# Patient Record
Sex: Female | Born: 1945 | Race: White | Hispanic: No | Smoking: Current some day smoker
Health system: Southern US, Community
[De-identification: ages and names within clinical notes are randomized; demographics above are authoritative.]

## PROBLEM LIST (undated history)

## (undated) DIAGNOSIS — N200 Calculus of kidney: Secondary | ICD-10-CM

## (undated) DIAGNOSIS — I10 Essential (primary) hypertension: Secondary | ICD-10-CM

## (undated) DIAGNOSIS — M1990 Unspecified osteoarthritis, unspecified site: Secondary | ICD-10-CM

## (undated) DIAGNOSIS — K529 Noninfective gastroenteritis and colitis, unspecified: Secondary | ICD-10-CM

## (undated) DIAGNOSIS — D649 Anemia, unspecified: Secondary | ICD-10-CM

## (undated) DIAGNOSIS — F1010 Alcohol abuse, uncomplicated: Secondary | ICD-10-CM

## (undated) HISTORY — DX: Calculus of kidney: N20.0

## (undated) HISTORY — DX: Alcohol abuse, uncomplicated: F10.10

## (undated) HISTORY — DX: Essential (primary) hypertension: I10

## (undated) HISTORY — DX: Unspecified osteoarthritis, unspecified site: M19.90

## (undated) HISTORY — PX: NO PAST SURGERIES: SHX2092

## (undated) HISTORY — DX: Noninfective gastroenteritis and colitis, unspecified: K52.9

## (undated) HISTORY — DX: Anemia, unspecified: D64.9

---

## 2011-02-25 ENCOUNTER — Emergency Department: Payer: Self-pay | Admitting: Emergency Medicine

## 2013-04-16 DIAGNOSIS — F1010 Alcohol abuse, uncomplicated: Secondary | ICD-10-CM | POA: Insufficient documentation

## 2014-02-24 DIAGNOSIS — Z720 Tobacco use: Secondary | ICD-10-CM | POA: Insufficient documentation

## 2014-03-01 LAB — HM MAMMOGRAPHY: HM MAMMO: NORMAL

## 2014-06-11 DIAGNOSIS — R0681 Apnea, not elsewhere classified: Secondary | ICD-10-CM | POA: Insufficient documentation

## 2014-06-11 DIAGNOSIS — R0609 Other forms of dyspnea: Secondary | ICD-10-CM | POA: Insufficient documentation

## 2014-06-11 DIAGNOSIS — J449 Chronic obstructive pulmonary disease, unspecified: Secondary | ICD-10-CM | POA: Insufficient documentation

## 2014-07-12 LAB — HM COLONOSCOPY: HM Colonoscopy: NEGATIVE

## 2015-08-24 ENCOUNTER — Ambulatory Visit: Payer: Self-pay | Admitting: Family Medicine

## 2015-09-19 ENCOUNTER — Encounter: Payer: Self-pay | Admitting: Nurse Practitioner

## 2015-09-19 ENCOUNTER — Ambulatory Visit (INDEPENDENT_AMBULATORY_CARE_PROVIDER_SITE_OTHER): Payer: PPO | Admitting: Nurse Practitioner

## 2015-09-19 VITALS — BP 142/88 | HR 83 | Temp 98.7°F | Resp 12 | Ht 62.25 in | Wt 143.2 lb

## 2015-09-19 DIAGNOSIS — Z720 Tobacco use: Secondary | ICD-10-CM | POA: Diagnosis not present

## 2015-09-19 DIAGNOSIS — R809 Proteinuria, unspecified: Secondary | ICD-10-CM | POA: Insufficient documentation

## 2015-09-19 DIAGNOSIS — F1010 Alcohol abuse, uncomplicated: Secondary | ICD-10-CM

## 2015-09-19 DIAGNOSIS — L409 Psoriasis, unspecified: Secondary | ICD-10-CM | POA: Insufficient documentation

## 2015-09-19 DIAGNOSIS — M1990 Unspecified osteoarthritis, unspecified site: Secondary | ICD-10-CM

## 2015-09-19 DIAGNOSIS — R42 Dizziness and giddiness: Secondary | ICD-10-CM

## 2015-09-19 DIAGNOSIS — Z7689 Persons encountering health services in other specified circumstances: Secondary | ICD-10-CM

## 2015-09-19 DIAGNOSIS — I10 Essential (primary) hypertension: Secondary | ICD-10-CM

## 2015-09-19 DIAGNOSIS — E785 Hyperlipidemia, unspecified: Secondary | ICD-10-CM | POA: Insufficient documentation

## 2015-09-19 DIAGNOSIS — Z7189 Other specified counseling: Secondary | ICD-10-CM

## 2015-09-19 MED ORDER — LOSARTAN POTASSIUM 100 MG PO TABS: 100.0000 mg | ORAL_TABLET | Freq: Every day | ORAL | Status: DC

## 2015-09-19 MED ORDER — MELOXICAM 15 MG PO TABS: 15.0000 mg | ORAL_TABLET | Freq: Every day | ORAL | Status: DC

## 2015-09-19 NOTE — Patient Instructions (Addendum)
Welcome to [facility]. We will contact you soon about your referral. Follow up in 1 month.   Nice to meet you!

## 2015-09-19 NOTE — Progress Notes (Signed)
Pre visit review using our clinic review tool, if applicable. No additional management support is needed unless otherwise documented below in the visit note. 

## 2015-09-19 NOTE — Progress Notes (Signed)
Patient ID: Heather BlanksBrenda W Winders, female    DOB: 1946-02-07  Age: 69 y.o. MRN: [id number]  CC: Establish Care   HPI Heather Rodgers presents for establishing care and CC of dizziness.   1) New pt info:  Immunizations- tdap 2015, pna 2015, wants Flu today (didn't receive due to time)   Mammogram- 2015   Pap- Unknown  Colonoscopy- 2015   Eye Exam- 08/19/15   LMP- 1989  2) Chronic Problems-  HTN- Not controlled, pt on losartan and needs refills.   Arthritis- Unsure about h/o arthritis. Need records  Alcohol abuse- 2-3 shots of liquor at night to "unwind"  Tobacco use- Willing to stop, has patches  3) Acute Problems-  Dizziness and falling down a couple of time   Balance off x 1 month  Stopped up easily on right side of face and ear  Feels like the room is spinning   Refills on losartan 100 mg and mobic and 15 mg 30 day supply requested.   History Steward DroneBrenda has a past medical history of Arthritis; Hypertension; and Kidney stones.   She has no past surgical history on file.   Her family history includes Arthritis in her father; Heart disease in her father and mother; Hypertension in her father and mother.She reports that she has been smoking Cigarettes.  She started smoking about 20 years ago. She has been smoking about 1.00 pack per day. She has never used smokeless tobacco. She reports that she drinks alcohol. She reports that she does not use illicit drugs.  No outpatient prescriptions prior to visit.   No facility-administered medications prior to visit.    ROS Review of Systems  Constitutional: Negative for fever, chills, diaphoresis and fatigue.  Respiratory: Negative for chest tightness, shortness of breath and wheezing.   Cardiovascular: Negative for chest pain, palpitations and leg swelling.  Gastrointestinal: Negative for nausea, vomiting and diarrhea.  Skin: Negative for rash.  Neurological: Positive for dizziness. Negative for weakness, numbness and headaches.   Psychiatric/Behavioral: The patient is not nervous/anxious.     Objective:  BP 142/88 mmHg  Pulse 83  Temp(Src) 98.7 F (37.1 C)  Resp 12  Ht 5' 2.25" (1.581 m)  Wt 143 lb 3.2 oz (64.955 kg)  BMI 25.99 kg/m2  SpO2 95%  Physical Exam  Constitutional: She is oriented to person, place, and time. She appears well-developed and well-nourished. No distress.  HENT:  Head: Normocephalic and atraumatic.  Right Ear: External ear normal.  Left Ear: External ear normal.  Eyes: EOM are normal. Pupils are equal, round, and reactive to light. Right eye exhibits no discharge. Left eye exhibits no discharge. No scleral icterus.  No nystagmus  Cardiovascular: Normal rate, regular rhythm and normal heart sounds.  Exam reveals no gallop and no friction rub.   No murmur heard. Pulmonary/Chest: Effort normal and breath sounds normal. No respiratory distress. She has no wheezes. She has no rales. She exhibits no tenderness.  Neurological: She is alert and oriented to person, place, and time. No cranial nerve deficit. She exhibits normal muscle tone. Coordination normal.  Unable to reproduce symptoms with position changes, normal romberg, heel/toe/sequential walking intact  Skin: Skin is warm and dry. No rash noted. She is not diaphoretic.  Psychiatric: She has a normal mood and affect. Her behavior is normal. Judgment and thought content normal.      Assessment & Plan:   Steward DroneBrenda was seen today for establish care.  Diagnoses and all orders for this visit:  Dizziness  and giddiness -     Ambulatory referral to ENT  HLD (hyperlipidemia)  Current tobacco use  AA (alcohol abuse)  Osteoarthritis, unspecified osteoarthritis type, unspecified site  Essential hypertension  Encounter to establish care  Other orders -     meloxicam (MOBIC) 15 MG tablet; Take 1 tablet (15 mg total) by mouth daily. -     losartan (COZAAR) 100 MG tablet; Take 1 tablet (100 mg total) by mouth daily.   I have  changed Ms. Delgreco's meloxicam and losartan. I am also having her maintain her aspirin EC, Cholecalciferol, and clobetasol ointment.  Meds ordered this encounter  Medications  . aspirin EC 81 MG tablet    Sig: Take 1 tablet by mouth daily.  . Cholecalciferol (D 5000) 5000 UNITS TABS    Sig: Take 1 tablet by mouth daily.  . clobetasol ointment (TEMOVATE) 0.05 %    Sig: Apply 2 application topically daily.  Marland Kitchen DISCONTD: losartan (COZAAR) 100 MG tablet    Sig: Take 100 mg by mouth daily.  Marland Kitchen DISCONTD: meloxicam (MOBIC) 15 MG tablet    Sig: Take 15 mg by mouth daily.  . meloxicam (MOBIC) 15 MG tablet    Sig: Take 1 tablet (15 mg total) by mouth daily.    Dispense:  30 tablet    Refill:  2    Order Specific Question:  Supervising Provider    Answer:  Duncan Dull L [[id number]]  . losartan (COZAAR) 100 MG tablet    Sig: Take 1 tablet (100 mg total) by mouth daily.    Dispense:  30 tablet    Refill:  2    Order Specific Question:  Supervising Provider    Answer:  Sherlene Shams [[id number]]     Follow-up: Return in about 4 weeks (around 10/17/2015) for Follow up of dizziness.

## 2015-09-22 ENCOUNTER — Ambulatory Visit: Payer: Self-pay | Admitting: Family Medicine

## 2015-09-28 ENCOUNTER — Encounter: Payer: Self-pay | Admitting: Nurse Practitioner

## 2015-09-28 DIAGNOSIS — Z124 Encounter for screening for malignant neoplasm of cervix: Secondary | ICD-10-CM | POA: Insufficient documentation

## 2015-09-28 DIAGNOSIS — R42 Dizziness and giddiness: Secondary | ICD-10-CM | POA: Insufficient documentation

## 2015-09-28 NOTE — Assessment & Plan Note (Signed)
Pt has patches and wants to quit. Follow up in 1 month.

## 2015-09-28 NOTE — Assessment & Plan Note (Signed)
Unsure of history. Will obtain records.

## 2015-09-28 NOTE — Assessment & Plan Note (Signed)
Ambulatory referral to ENT placed to check inner ear.

## 2015-09-28 NOTE — Assessment & Plan Note (Signed)
BP Readings from Last 3 Encounters:  09/19/15 142/88   Uncontrolled. Losartan refilled. Pt has been out of medication for unknown period of time she reports (probably days approx). Will follow up in 4 weeks.

## 2015-09-28 NOTE — Assessment & Plan Note (Signed)
Pulled over note from [facility]. Pt stated 2-3 shots at night, last records reports 6-7 shots of vodka in 2014. Pt did not seem intoxicated at visit today.

## 2015-09-28 NOTE — Assessment & Plan Note (Signed)
Will obtain records.   No results found for: CHOL, HDL, LDLCALC, LDLDIRECT, TRIG, CHOLHDL

## 2015-09-28 NOTE — Assessment & Plan Note (Signed)
Discussed acute and chronic issues. Reviewed health maintenance measures, PFSHx, and immunizations. Obtain records from previous facility.   

## 2015-09-30 ENCOUNTER — Other Ambulatory Visit: Payer: Self-pay | Admitting: Otolaryngology

## 2015-09-30 DIAGNOSIS — R42 Dizziness and giddiness: Secondary | ICD-10-CM

## 2015-10-13 ENCOUNTER — Ambulatory Visit
Admission: RE | Admit: 2015-10-13 | Discharge: 2015-10-13 | Disposition: A | Discharge status: Home / Self Care | Payer: PPO | Source: Ambulatory Visit | Attending: Otolaryngology | Admitting: Otolaryngology

## 2015-10-13 DIAGNOSIS — I6523 Occlusion and stenosis of bilateral carotid arteries: Secondary | ICD-10-CM | POA: Insufficient documentation

## 2015-10-13 DIAGNOSIS — R42 Dizziness and giddiness: Secondary | ICD-10-CM | POA: Insufficient documentation

## 2015-10-13 MED ORDER — GADOBENATE DIMEGLUMINE 529 MG/ML IV SOLN
15.0000 mL | Freq: Once | INTRAVENOUS | Status: AC | PRN
  Administered 2015-10-13: 13 mL via INTRAVENOUS

## 2015-10-17 ENCOUNTER — Ambulatory Visit: Payer: PPO | Admitting: Nurse Practitioner

## 2015-10-18 ENCOUNTER — Ambulatory Visit: Payer: PPO | Admitting: Nurse Practitioner

## 2015-10-25 ENCOUNTER — Other Ambulatory Visit: Payer: Self-pay | Admitting: Vascular Surgery

## 2015-10-25 DIAGNOSIS — I6523 Occlusion and stenosis of bilateral carotid arteries: Secondary | ICD-10-CM

## 2015-10-27 ENCOUNTER — Ambulatory Visit
Admission: RE | Admit: 2015-10-27 | Discharge: 2015-10-27 | Disposition: A | Discharge status: Home / Self Care | Payer: PPO | Source: Ambulatory Visit | Attending: Vascular Surgery | Admitting: Vascular Surgery

## 2015-10-27 DIAGNOSIS — I6523 Occlusion and stenosis of bilateral carotid arteries: Secondary | ICD-10-CM | POA: Insufficient documentation

## 2015-10-27 MED ORDER — IOHEXOL 350 MG/ML SOLN
100.0000 mL | Freq: Once | INTRAVENOUS | Status: AC | PRN
  Administered 2015-10-27: 80 mL via INTRAVENOUS

## 2015-11-02 ENCOUNTER — Telehealth: Payer: Self-pay | Admitting: *Deleted

## 2015-11-02 NOTE — Telephone Encounter (Signed)
Patient requested a referral to see the Neurologist, patient stated that she has been for a MRI,Ultrasound and to the ear specialist. Her Ultrasound viewed that her arteries were not causing her dizziness. Please Advised

## 2015-11-04 ENCOUNTER — Other Ambulatory Visit: Payer: Self-pay | Admitting: Nurse Practitioner

## 2015-11-04 DIAGNOSIS — R42 Dizziness and giddiness: Secondary | ICD-10-CM

## 2015-11-04 NOTE — Telephone Encounter (Signed)
Please advise for referral, thanks 

## 2015-11-04 NOTE — Telephone Encounter (Signed)
Referral placed  Thanks!

## 2015-11-04 NOTE — Telephone Encounter (Signed)
Patient requested a call, please advise-thanks

## 2015-11-09 ENCOUNTER — Ambulatory Visit (INDEPENDENT_AMBULATORY_CARE_PROVIDER_SITE_OTHER): Payer: PPO | Admitting: Nurse Practitioner

## 2015-11-09 VITALS — BP 142/90 | HR 68 | Temp 97.5°F | Ht 62.25 in | Wt 142.0 lb

## 2015-11-09 DIAGNOSIS — R208 Other disturbances of skin sensation: Secondary | ICD-10-CM | POA: Diagnosis not present

## 2015-11-09 DIAGNOSIS — R42 Dizziness and giddiness: Secondary | ICD-10-CM | POA: Diagnosis not present

## 2015-11-09 DIAGNOSIS — R200 Anesthesia of skin: Secondary | ICD-10-CM

## 2015-11-09 LAB — CBC WITH DIFFERENTIAL/PLATELET
BASOS PCT: 0.4 % (ref 0.0–3.0)
Basophils Absolute: 0 10*3/uL (ref 0.0–0.1)
EOS ABS: 0 10*3/uL (ref 0.0–0.7)
Eosinophils Relative: 1 % (ref 0.0–5.0)
HEMATOCRIT: 29.1 % — AB (ref 36.0–46.0)
Hemoglobin: 9.9 g/dL — ABNORMAL LOW (ref 12.0–15.0)
LYMPHS ABS: 0.6 10*3/uL — AB (ref 0.7–4.0)
Lymphocytes Relative: 18.1 % (ref 12.0–46.0)
MCHC: 34 g/dL (ref 30.0–36.0)
MCV: 108.9 fl — ABNORMAL HIGH (ref 78.0–100.0)
MONO ABS: 0.3 10*3/uL (ref 0.1–1.0)
Monocytes Relative: 10.5 % (ref 3.0–12.0)
NEUTROS ABS: 2.2 10*3/uL (ref 1.4–7.7)
NEUTROS PCT: 70 % (ref 43.0–77.0)
PLATELETS: 87 10*3/uL — AB (ref 150.0–400.0)
RBC: 2.67 Mil/uL — ABNORMAL LOW (ref 3.87–5.11)
RDW: 14.9 % (ref 11.5–15.5)
WBC: 3.1 10*3/uL — ABNORMAL LOW (ref 4.0–10.5)

## 2015-11-09 LAB — COMPREHENSIVE METABOLIC PANEL
ALK PHOS: 101 U/L (ref 39–117)
ALT: 11 U/L (ref 0–35)
AST: 48 U/L — AB (ref 0–37)
Albumin: 3.7 g/dL (ref 3.5–5.2)
BILIRUBIN TOTAL: 1.1 mg/dL (ref 0.2–1.2)
BUN: 14 mg/dL (ref 6–23)
CO2: 21 meq/L (ref 19–32)
CREATININE: 0.9 mg/dL (ref 0.40–1.20)
Calcium: 8.7 mg/dL (ref 8.4–10.5)
Chloride: 105 mEq/L (ref 96–112)
GFR: 66.24 mL/min (ref >60.00)
GLUCOSE: 79 mg/dL (ref 70–99)
Potassium: 3.7 mEq/L (ref 3.5–5.1)
Sodium: 142 mEq/L (ref 135–145)
TOTAL PROTEIN: 8.5 g/dL — AB (ref 6.0–8.3)

## 2015-11-09 LAB — FOLATE: Folate: 4.3 ng/mL — ABNORMAL LOW (ref >5.9)

## 2015-11-09 LAB — VITAMIN B12: VITAMIN B 12: 214 pg/mL (ref 211–911)

## 2015-11-09 MED ORDER — GABAPENTIN 300 MG PO CAPS: 300.0000 mg | ORAL_CAPSULE | Freq: Every day | ORAL | Status: DC

## 2015-11-09 NOTE — Progress Notes (Signed)
Patient ID: Heather Rodgers, female    DOB: 31-Oct-1946  Age: 69 y.o. MRN: [id number]  CC: Acute Visit   HPI ERNESTENE COOVER presents for CC of left arm/hand numbness.   1) Left elbow to hand numbness x 1 month.   Onset- 1 month  Location- left arm radial side from elbow to thumb and into each finger she reports; denies palm or dorsal surface numbness.  Duration - Intermittent, but daily  Characteristics- numbness Aggravating factors- falling, use of left arm Relieving factors- rest and mobic Severity- moderate  History Gigi has a past medical history of Arthritis; Hypertension; and Kidney stones.   She has no past surgical history on file.   Her family history includes Arthritis in her father; Heart disease in her father and mother; Hypertension in her father and mother.She reports that she has been smoking Cigarettes.  She started smoking about 20 years ago. She has been smoking about 1.00 pack per day. She has never used smokeless tobacco. She reports that she drinks alcohol. She reports that she does not use illicit drugs.  Outpatient Prescriptions Prior to Visit  Medication Sig Dispense Refill  . aspirin EC 81 MG tablet Take 1 tablet by mouth daily.    . Cholecalciferol (D 5000) 5000 UNITS TABS Take 1 tablet by mouth daily.    . clobetasol ointment (TEMOVATE) 9.20 % Apply 2 application topically daily.    Marland Kitchen losartan (COZAAR) 100 MG tablet Take 1 tablet (100 mg total) by mouth daily. 30 tablet 2  . meloxicam (MOBIC) 15 MG tablet Take 1 tablet (15 mg total) by mouth daily. 30 tablet 2   No facility-administered medications prior to visit.    ROS Review of Systems  Constitutional: Negative for fever, chills, diaphoresis and fatigue.  Respiratory: Negative for chest tightness, shortness of breath and wheezing.   Cardiovascular: Negative for chest pain, palpitations and leg swelling.  Gastrointestinal: Negative for nausea, vomiting and diarrhea.  Skin: Negative for rash.   Neurological: Positive for numbness. Negative for dizziness, weakness and headaches.   Objective:  BP 142/90 mmHg  Pulse 68  Temp(Src) 97.5 F (36.4 C) (Oral)  Ht 5' 2.25" (1.581 m)  Wt 142 lb (64.411 kg)  BMI 25.77 kg/m2  Physical Exam  Constitutional: She is oriented to person, place, and time. She appears well-developed and well-nourished. No distress.  HENT:  Head: Normocephalic and atraumatic.  Right Ear: External ear normal.  Left Ear: External ear normal.  Cardiovascular: Normal rate, regular rhythm and normal heart sounds.  Exam reveals no gallop and no friction rub.   No murmur heard. Pulmonary/Chest: Effort normal and breath sounds normal. No respiratory distress. She has no wheezes. She has no rales. She exhibits no tenderness.  Neurological: She is alert and oriented to person, place, and time. No cranial nerve deficit. She exhibits normal muscle tone. Coordination normal.  Deltoid 5/5 Bilateral, Biceps 5/5 bilateral, Wrist extensors 5/5 bilateral, Triceps 5/5 bilateral, finger flexors and abductors 5/5 bilateral, grip 5/5 bilateraly no Hoffman's, intact heel/toe/sequential walking, sensation intact upper and lower extremities. Straight leg raise negative bilaterally. DTR's upper and lower 2+   Skin: Skin is warm and dry. No rash noted. She is not diaphoretic.  Psychiatric: She has a normal mood and affect. Her behavior is normal. Judgment and thought content normal.      Assessment & Plan:   Rynn was seen today for acute visit.  Diagnoses and all orders for this visit:  Dizziness and giddiness -  B12 -     Comp Met (CMET) -     Folate -     CBC w/Diff  Left arm numbness  Dizziness  Other orders -     gabapentin (NEURONTIN) 300 MG capsule; Take 1 capsule (300 mg total) by mouth at bedtime.   I have discontinued Ms. Worst's meloxicam. I am also having her start on gabapentin. Additionally, I am having her maintain her aspirin EC, Cholecalciferol,  clobetasol ointment, losartan, and meclizine.  Meds ordered this encounter  Medications  . meclizine (ANTIVERT) 25 MG tablet    Sig: Take 25 mg by mouth 3 (three) times daily as needed for dizziness.  . gabapentin (NEURONTIN) 300 MG capsule    Sig: Take 1 capsule (300 mg total) by mouth at bedtime.    Dispense:  30 capsule    Refill:  0    Order Specific Question:  Supervising Provider    Answer:  Crecencio Mc [[id number]]     Follow-up: Return in about 2 weeks (around 11/23/2015) for Numbness left hand.

## 2015-11-09 NOTE — Patient Instructions (Addendum)
Follow up in 2 weeks.   Gabapentin 1 capsule at night time.   Neurology appointment in Jan. With Dr. Everlena CooperJaffe.

## 2015-11-10 ENCOUNTER — Ambulatory Visit: Payer: PPO | Admitting: Nurse Practitioner

## 2015-11-16 ENCOUNTER — Encounter: Payer: Self-pay | Admitting: Nurse Practitioner

## 2015-11-16 ENCOUNTER — Ambulatory Visit (INDEPENDENT_AMBULATORY_CARE_PROVIDER_SITE_OTHER): Payer: PPO | Admitting: Nurse Practitioner

## 2015-11-16 VITALS — BP 152/88 | HR 88 | Temp 97.5°F | Ht 65.0 in | Wt 146.0 lb

## 2015-11-16 DIAGNOSIS — F1010 Alcohol abuse, uncomplicated: Secondary | ICD-10-CM

## 2015-11-16 DIAGNOSIS — D500 Iron deficiency anemia secondary to blood loss (chronic): Secondary | ICD-10-CM

## 2015-11-16 DIAGNOSIS — R42 Dizziness and giddiness: Secondary | ICD-10-CM

## 2015-11-16 DIAGNOSIS — R05 Cough: Secondary | ICD-10-CM | POA: Diagnosis not present

## 2015-11-16 DIAGNOSIS — D519 Vitamin B12 deficiency anemia, unspecified: Secondary | ICD-10-CM

## 2015-11-16 DIAGNOSIS — I10 Essential (primary) hypertension: Secondary | ICD-10-CM

## 2015-11-16 DIAGNOSIS — Z720 Tobacco use: Secondary | ICD-10-CM | POA: Diagnosis not present

## 2015-11-16 DIAGNOSIS — R059 Cough, unspecified: Secondary | ICD-10-CM | POA: Insufficient documentation

## 2015-11-16 DIAGNOSIS — R200 Anesthesia of skin: Secondary | ICD-10-CM | POA: Insufficient documentation

## 2015-11-16 LAB — CBC WITH DIFFERENTIAL/PLATELET
Basophils Absolute: 0 10*3/uL (ref 0.0–0.1)
Basophils Relative: 0.5 % (ref 0.0–3.0)
EOS PCT: 1 % (ref 0.0–5.0)
Eosinophils Absolute: 0 10*3/uL (ref 0.0–0.7)
HCT: 28.3 % — ABNORMAL LOW (ref 36.0–46.0)
Hemoglobin: 9.7 g/dL — ABNORMAL LOW (ref 12.0–15.0)
LYMPHS ABS: 0.6 10*3/uL — AB (ref 0.7–4.0)
Lymphocytes Relative: 13.9 % (ref 12.0–46.0)
MCHC: 34.4 g/dL (ref 30.0–36.0)
MONOS PCT: 8.2 % (ref 3.0–12.0)
Monocytes Absolute: 0.3 10*3/uL (ref 0.1–1.0)
NEUTROS ABS: 3.2 10*3/uL (ref 1.4–7.7)
NEUTROS PCT: 76.4 % (ref 43.0–77.0)
Platelets: 87 10*3/uL — ABNORMAL LOW (ref 150.0–400.0)
RDW: 15.1 % (ref 11.5–15.5)
WBC: 4.2 10*3/uL (ref 4.0–10.5)

## 2015-11-16 MED ORDER — CYANOCOBALAMIN 1000 MCG/ML IJ SOLN
1000.0000 ug | Freq: Once | INTRAMUSCULAR | Status: AC
  Administered 2015-11-16: 1000 ug via INTRAMUSCULAR

## 2015-11-16 NOTE — Assessment & Plan Note (Signed)
Patient was given meclizine at 11/09/2015 appointment

## 2015-11-16 NOTE — Patient Instructions (Addendum)
Thank you for getting a B-12 injection today.   Repeat for 4 weeks total (3 more).   [facility] [address] [place], [place] [address] Hours of Operation Monday - Friday, 8 a.m. - 5 p.m. Main: [phone]   Walk in at your convenience for Chest x-ray.   We will call with next instructions.

## 2015-11-16 NOTE — Assessment & Plan Note (Signed)
Recent cough with small amount of blood in sputum. No chest x-ray on history will obtain this. Patient given instructions on handout and verbally.

## 2015-11-16 NOTE — Assessment & Plan Note (Signed)
Numbness from elbow to hand. Will try patient on gabapentin since neuro exam is essentially normal. Instructions given verbally and on AVS. Patient questions were answered to satisfaction. Discussed taking at nighttime to avoid drowsy effects during the day. Follow-up in 2 weeks

## 2015-11-16 NOTE — Assessment & Plan Note (Signed)
Patient is still had dizziness and has been checked out by ENT, vascular, and has an appointment upcoming with neuro. This could be due to anemia as well. Patient still having arm numbness that has not changed with Neurontin. Will follow

## 2015-11-16 NOTE — Assessment & Plan Note (Signed)
Patient is still drinking approximately 4 glasses of mixed drink with Dr. and Gatorade daily. She was advised to cut down slowly on alcohol intake her AST was elevated at last visit. She says she is not interested in quitting drinking or smoking.

## 2015-11-16 NOTE — Progress Notes (Signed)
Patient ID: Heather Rodgers, female    DOB: 1946-02-11  Age: 69 y.o. MRN: [id number]  CC: Follow-up   HPI Heather Rodgers presents for follow-up of anemia. Patient is accompanied by her daughter today.  1) No Change of left-sided numbness from elbow to hand.  2) Coughed up a little bit of blood yesterday "streak" Patient reports that she does have bleeding with brushing her teeth daily.  Pt reports bleeding from vaginal/urethral area 3 months ago after arthritis medication   Patient denies ever having been told she has low platelets in past.  3) Gatorade and vodka 4 mixed drinks daily  Still smoking she does report she wants to continue to smoke and understands the health risks that are associated with this. She does not have any recent chest x-rays on file and does not remember having one recently. Will obtain today  A she reports she would like to have her daughter signed a form to get health information since she is the one helping her out with her appointments and such.  History Heather Rodgers has a past medical history of Arthritis; Hypertension; and Kidney stones.   She has no past surgical history on file.   Her family history includes Arthritis in her father; Heart disease in her father and mother; Hypertension in her father and mother.She reports that she has been smoking Cigarettes.  She started smoking about 20 years ago. She has been smoking about 1.00 pack per day. She has never used smokeless tobacco. She reports that she drinks alcohol. She reports that she does not use illicit drugs.  Outpatient Prescriptions Prior to Visit  Medication Sig Dispense Refill  . aspirin EC 81 MG tablet Take 1 tablet by mouth daily.    . clobetasol ointment (TEMOVATE) 0.05 % Apply 2 application topically daily.    Marland Kitchen gabapentin (NEURONTIN) 300 MG capsule Take 1 capsule (300 mg total) by mouth at bedtime. 30 capsule 0  . losartan (COZAAR) 100 MG tablet Take 1 tablet (100 mg total) by mouth daily. 30  tablet 2  . meclizine (ANTIVERT) 25 MG tablet Take 25 mg by mouth 3 (three) times daily as needed for dizziness.    . meloxicam (MOBIC) 15 MG tablet Take 1 tablet (15 mg total) by mouth daily. 30 tablet 2  . Cholecalciferol (D 5000) 5000 UNITS TABS Take 1 tablet by mouth daily.     No facility-administered medications prior to visit.    ROS Review of Systems  Constitutional: Negative for fever, chills, diaphoresis and fatigue.  HENT: Negative for mouth sores and nosebleeds.   Respiratory: Positive for cough. Negative for chest tightness, shortness of breath and wheezing.   Cardiovascular: Negative for chest pain, palpitations and leg swelling.  Gastrointestinal: Negative for nausea, vomiting, diarrhea, blood in stool and anal bleeding.  Genitourinary: Negative for hematuria.  Skin: Negative for rash.  Neurological: Positive for numbness. Negative for dizziness, weakness and headaches.       Left arm from elbow to hand  Hematological: Bruises/bleeds easily.  Psychiatric/Behavioral: The patient is not nervous/anxious.     Objective:  BP 152/88 mmHg  Pulse 88  Temp(Src) 97.5 F (36.4 C)  Ht  (1.651 m)  Wt 146 lb (66.225 kg)  BMI 24.30 kg/m2  SpO2 97%  Physical Exam  Constitutional: She is oriented to person, place, and time. She appears well-developed and well-nourished. No distress.  HENT:  Head: Normocephalic and atraumatic.  Right Ear: External ear normal.  Left Ear: External ear  normal.  Cardiovascular: Normal rate and regular rhythm.   Pulmonary/Chest: Effort normal and breath sounds normal. No respiratory distress. She has no wheezes. She has no rales. She exhibits no tenderness.  Neurological: She is alert and oriented to person, place, and time. No cranial nerve deficit. She exhibits normal muscle tone. Coordination normal.  Skin: Skin is warm and dry. No rash noted. She is not diaphoretic.  Psychiatric:  Patient has flat affect but was tearful today    Assessment & Plan:   Heather Rodgers was seen today for follow-up.  Diagnoses and all orders for this visit:  Dizziness and giddiness  Anemia due to chronic blood loss -     CBC with Differential/Platelet  Cough -     DG Chest 2 View; Future  Current tobacco use -     DG Chest 2 View; Future  B12 deficiency anemia -     cyanocobalamin ((VITAMIN B-12)) injection 1,000 mcg; Inject 1 mL (1,000 mcg total) into the muscle once.  AA (alcohol abuse)  Essential hypertension   I am having Ms. Bais maintain her aspirin EC, Cholecalciferol, clobetasol ointment, meloxicam, losartan, meclizine, and gabapentin. We administered cyanocobalamin.  Meds ordered this encounter  Medications  . cyanocobalamin ((VITAMIN B-12)) injection 1,000 mcg    Sig:      Follow-up: Return in about 1 week (around 11/23/2015) for B12 injection .

## 2015-11-16 NOTE — Assessment & Plan Note (Addendum)
Patient is taking losartan daily. Her blood pressure still uncontrolled at this time. We'll do follow-up visit after we figure out where bleeding is coming from. Recent CTA shows no bleeding into the brain.

## 2015-11-16 NOTE — Assessment & Plan Note (Signed)
Patient does have very low hemoglobin and hematocrit and platelets. Her daughter is with her and gives a history of recent blood loss either vaginally or in urine. This resolved about one month ago. We'll recheck today and if trending down will send her to hematology or ER for replacement.

## 2015-11-16 NOTE — Assessment & Plan Note (Signed)
Patient reports she has patches at home and has not used them. Daughter reports she has had these for quite some time. Patient understands risks associated with continuous smoking. Will obtain chest x-ray today

## 2015-11-16 NOTE — Assessment & Plan Note (Signed)
Patient's recent B12 was at the very low end of normal. Will start replacement therapy with weekly B12 injections 4 weeks and repeat a level. Patient's folate level was also low advised multivitamin with folate in it. Patient is a known alcoholic and does not want to cut down on drinking.

## 2015-11-17 ENCOUNTER — Other Ambulatory Visit: Payer: Self-pay | Admitting: Nurse Practitioner

## 2015-11-17 DIAGNOSIS — D500 Iron deficiency anemia secondary to blood loss (chronic): Secondary | ICD-10-CM

## 2015-11-23 ENCOUNTER — Ambulatory Visit (INDEPENDENT_AMBULATORY_CARE_PROVIDER_SITE_OTHER): Payer: PPO

## 2015-11-23 ENCOUNTER — Ambulatory Visit
Admission: RE | Admit: 2015-11-23 | Discharge: 2015-11-23 | Disposition: A | Discharge status: Home / Self Care | Payer: PPO | Source: Ambulatory Visit | Attending: Nurse Practitioner | Admitting: Nurse Practitioner

## 2015-11-23 ENCOUNTER — Ambulatory Visit: Payer: PPO | Admitting: Nurse Practitioner

## 2015-11-23 DIAGNOSIS — R05 Cough: Secondary | ICD-10-CM | POA: Insufficient documentation

## 2015-11-23 DIAGNOSIS — E538 Deficiency of other specified B group vitamins: Secondary | ICD-10-CM

## 2015-11-23 DIAGNOSIS — F17200 Nicotine dependence, unspecified, uncomplicated: Secondary | ICD-10-CM | POA: Diagnosis present

## 2015-11-23 DIAGNOSIS — R918 Other nonspecific abnormal finding of lung field: Secondary | ICD-10-CM | POA: Insufficient documentation

## 2015-11-23 DIAGNOSIS — R059 Cough, unspecified: Secondary | ICD-10-CM

## 2015-11-23 DIAGNOSIS — Z720 Tobacco use: Secondary | ICD-10-CM

## 2015-11-23 MED ORDER — CYANOCOBALAMIN 1000 MCG/ML IJ SOLN
1000.0000 ug | Freq: Once | INTRAMUSCULAR | Status: AC
  Administered 2015-11-23: 1000 ug via INTRAMUSCULAR

## 2015-11-23 NOTE — Progress Notes (Signed)
Patient came in for B12 injection.  Received in Left deltoid.  Patient tolerated well.  

## 2015-11-27 DIAGNOSIS — K529 Noninfective gastroenteritis and colitis, unspecified: Secondary | ICD-10-CM

## 2015-11-27 DIAGNOSIS — F1010 Alcohol abuse, uncomplicated: Secondary | ICD-10-CM

## 2015-11-27 HISTORY — DX: Alcohol abuse, uncomplicated: F10.10

## 2015-11-27 HISTORY — DX: Noninfective gastroenteritis and colitis, unspecified: K52.9

## 2015-11-29 ENCOUNTER — Inpatient Hospital Stay: Payer: PPO | Attending: Hematology and Oncology | Admitting: Internal Medicine

## 2015-11-29 ENCOUNTER — Other Ambulatory Visit: Payer: Self-pay | Admitting: Nurse Practitioner

## 2015-11-29 ENCOUNTER — Encounter: Payer: Self-pay | Admitting: Internal Medicine

## 2015-11-29 ENCOUNTER — Inpatient Hospital Stay: Payer: PPO

## 2015-11-29 VITALS — BP 167/110 | HR 80 | Temp 97.0°F | Resp 18 | Ht 65.0 in | Wt 146.2 lb

## 2015-11-29 DIAGNOSIS — M129 Arthropathy, unspecified: Secondary | ICD-10-CM | POA: Diagnosis not present

## 2015-11-29 DIAGNOSIS — J42 Unspecified chronic bronchitis: Secondary | ICD-10-CM

## 2015-11-29 DIAGNOSIS — F1010 Alcohol abuse, uncomplicated: Secondary | ICD-10-CM | POA: Diagnosis not present

## 2015-11-29 DIAGNOSIS — Z87442 Personal history of urinary calculi: Secondary | ICD-10-CM | POA: Insufficient documentation

## 2015-11-29 DIAGNOSIS — D696 Thrombocytopenia, unspecified: Secondary | ICD-10-CM | POA: Insufficient documentation

## 2015-11-29 DIAGNOSIS — D649 Anemia, unspecified: Secondary | ICD-10-CM

## 2015-11-29 DIAGNOSIS — F17210 Nicotine dependence, cigarettes, uncomplicated: Secondary | ICD-10-CM | POA: Diagnosis not present

## 2015-11-29 DIAGNOSIS — Z79899 Other long term (current) drug therapy: Secondary | ICD-10-CM | POA: Insufficient documentation

## 2015-11-29 DIAGNOSIS — J449 Chronic obstructive pulmonary disease, unspecified: Secondary | ICD-10-CM | POA: Insufficient documentation

## 2015-11-29 DIAGNOSIS — Z7982 Long term (current) use of aspirin: Secondary | ICD-10-CM | POA: Diagnosis not present

## 2015-11-29 DIAGNOSIS — R42 Dizziness and giddiness: Secondary | ICD-10-CM | POA: Diagnosis not present

## 2015-11-29 DIAGNOSIS — E538 Deficiency of other specified B group vitamins: Secondary | ICD-10-CM | POA: Diagnosis not present

## 2015-11-29 DIAGNOSIS — I10 Essential (primary) hypertension: Secondary | ICD-10-CM | POA: Diagnosis not present

## 2015-11-29 DIAGNOSIS — R05 Cough: Secondary | ICD-10-CM | POA: Insufficient documentation

## 2015-11-29 DIAGNOSIS — R918 Other nonspecific abnormal finding of lung field: Secondary | ICD-10-CM | POA: Insufficient documentation

## 2015-11-29 LAB — CBC WITH DIFFERENTIAL/PLATELET
BASOS ABS: 0 10*3/uL (ref 0–0.1)
Basophils Relative: 1 %
Eosinophils Absolute: 0 10*3/uL (ref 0–0.7)
Eosinophils Relative: 1 %
HEMATOCRIT: 29.9 % — AB (ref 35.0–47.0)
Hemoglobin: 10 g/dL — ABNORMAL LOW (ref 12.0–16.0)
LYMPHS ABS: 0.6 10*3/uL — AB (ref 1.0–3.6)
LYMPHS PCT: 19 %
MCH: 34.9 pg — ABNORMAL HIGH (ref 26.0–34.0)
MCHC: 33.3 g/dL (ref 32.0–36.0)
MCV: 104.8 fL — AB (ref 80.0–100.0)
MONO ABS: 0.2 10*3/uL (ref 0.2–0.9)
Monocytes Relative: 8 %
NEUTROS ABS: 2.1 10*3/uL (ref 1.4–6.5)
Neutrophils Relative %: 71 %
Platelets: 110 10*3/uL — ABNORMAL LOW (ref 150–440)
RBC: 2.86 MIL/uL — AB (ref 3.80–5.20)
RDW: 14.6 % — ABNORMAL HIGH (ref 11.5–14.5)
WBC: 3 10*3/uL — ABNORMAL LOW (ref 3.6–11.0)

## 2015-11-29 LAB — LACTATE DEHYDROGENASE: LDH: 185 U/L (ref 98–192)

## 2015-11-29 LAB — RETICULOCYTES
RBC.: 2.86 MIL/uL — ABNORMAL LOW (ref 3.80–5.20)
RETIC COUNT ABSOLUTE: 45.8 10*3/uL (ref 19.0–183.0)
Retic Ct Pct: 1.6 % (ref 0.4–3.1)

## 2015-11-29 LAB — FERRITIN: FERRITIN: 128 ng/mL (ref 11–307)

## 2015-11-29 NOTE — Progress Notes (Signed)
Pt here as new pt. And she is still having bleeding with brushing her teeth. She drinks vodka every day. She states she had colonoscopy and EGD last year at [facility]. She cont. To be dizzy not every day but a lot of days and take antivert but today she says it is not helping her.  She walked in without help but daughter says sometimes she needs help. She does hold furniture at home she states not all the time

## 2015-11-29 NOTE — Progress Notes (Signed)
[facility] @ [facility] [phone]  [phone]     Heather Rodgers OB: [birth year]  MR#: [id number]  AOZ#:[id number]  Patient Care Team: Carollee Leitz, NP as PCP - General (Gerontology)  CHIEF COMPLAINT:  Chief Complaint  Patient presents with  . Anemia     No history exists.    Oncology Flowsheet 11/16/2015 11/23/2015  cyanocobalamin ((VITAMIN B-12)) IM 1,000 mcg 1,000 mcg    HISTORY OF PRESENT ILLNESS:   Heather Rodgers is a 70 year old lady with a long history of alcohol abuse, who is referred for evaluation of recently discovered anemia, thrombocytopenia in the setting of gum bleeding. She claims that she noticed bleeding from the mouth while brushing her teeth. She claims that she does not have any other evidence of bleeding, except for coughing up small amounts of blood-tinged sputum. She claims that her stool is dark, but she did not see any frank bleeding. She had colonoscopy in November 2015, which was reportedly negative. She does not remember whether she had an EGD at that time as well. She claims that her diet is suboptimal, but she does not wish to discuss her diet in details. She has been feeling very dizzy recently, and has to hold onto furniture at home from time to time to avoid falling. She denies nausea, vomiting, diarrhea, constipation, weight loss, lymphadenopathy. REVIEW OF SYSTEMS:   ROS   PAST MEDICAL HISTORY: Past Medical History  Diagnosis Date  . Arthritis   . Hypertension   . Kidney stones   . Anemia     PAST SURGICAL HISTORY: History reviewed. No pertinent past surgical history.  FAMILY HISTORY Family History  Problem Relation Age of Onset  . Heart disease Mother   . Hypertension Mother   . Arthritis Father   . Heart disease Father   . Hypertension Father     ADVANCED DIRECTIVES:  No flowsheet data found.  HEALTH MAINTENANCE: Social History  Substance Use Topics  . Smoking status: Current Every Day Smoker -- 1.00  packs/day    Types: Cigarettes    Start date: 09/19/1995  . Smokeless tobacco: Never Used     Comment: Has patches  . Alcohol Use: 0.0 oz/week    0 Standard drinks or equivalent per week     Comment: 2-3 shots daily      Allergies  Allergen Reactions  . Penicillins Hives    Current Outpatient Prescriptions  Medication Sig Dispense Refill  . aspirin EC 81 MG tablet Take 1 tablet by mouth daily.    . Cholecalciferol (D 5000) 5000 UNITS TABS Take 1 tablet by mouth daily.    Marland Kitchen gabapentin (NEURONTIN) 300 MG capsule Take 1 capsule (300 mg total) by mouth at bedtime. 30 capsule 0  . losartan (COZAAR) 100 MG tablet Take 1 tablet (100 mg total) by mouth daily. 30 tablet 2  . meclizine (ANTIVERT) 25 MG tablet Take 25 mg by mouth 3 (three) times daily as needed for dizziness.    . meloxicam (MOBIC) 15 MG tablet Take 15 mg by mouth daily.     No current facility-administered medications for this visit.    OBJECTIVE:  Filed Vitals:   11/29/15 1504  BP: 167/110  Pulse: 80  Temp: 97 F (36.1 C)  Resp: 18     Body mass index is 24.32 kg/(m^2).    ECOG FS:1 - Symptomatic but completely ambulatory  Physical Exam  Constitutional: She is oriented to person, place, and time and well-developed, well-nourished, and in  no distress. No distress.  Pale Caucasian female  HENT:  Head: Normocephalic and atraumatic.  Right Ear: External ear normal.  Left Ear: External ear normal.  Mouth/Throat: Oropharynx is clear and moist.  Eyes: Conjunctivae are normal. Pupils are equal, round, and reactive to light. Right eye exhibits no discharge. Left eye exhibits no discharge. No scleral icterus.  Neck: Normal range of motion. Neck supple. No JVD present. No tracheal deviation present. No thyromegaly present.  Cardiovascular: Normal rate, regular rhythm, normal heart sounds and intact distal pulses.  Exam reveals no gallop and no friction rub.   No murmur heard. Pulmonary/Chest: Effort normal and breath  sounds normal. No stridor. No respiratory distress. She has no wheezes. She has no rales. She exhibits no tenderness.  Abdominal: Soft. Bowel sounds are normal. She exhibits mass (The liver is palpable 4 cm below right costal margin). She exhibits no distension. There is no tenderness. There is no rebound and no guarding.  Genitourinary:  Postponed  Musculoskeletal: Normal range of motion. She exhibits no edema or tenderness.  Lymphadenopathy:    She has no cervical adenopathy.  Neurological: She is alert and oriented to person, place, and time. She has normal reflexes. No cranial nerve deficit. She exhibits normal muscle tone. Gait normal. Coordination normal. GCS score is 15.  Skin: Skin is warm. No rash noted. She is not diaphoretic. No erythema. No pallor.  Psychiatric: Mood, memory, affect and judgment normal.  Nursing note and vitals reviewed.    LAB RESULTS:  CBC Latest Ref Rng 11/29/2015 11/16/2015  WBC 3.6 - 11.0 K/uL 3.0(L) 4.2  Hemoglobin 12.0 - 16.0 g/dL 10.0(L) 9.7(L)  Hematocrit 35.0 - 47.0 % 29.9(L) 28.3(L)  Platelets 150 - 440 K/uL 110(L) 87.0(L)    No visits with results within 5 Day(s) from this visit. Latest known visit with results is:  Office Visit on 11/16/2015  Component Date Value Ref Range Status  . WBC 11/16/2015 4.2  4.0 - 10.5 K/uL Final  . RBC 11/16/2015 2.53 aL* 3.87 - 5.11 Mil/uL Final  . Hemoglobin 11/16/2015 9.7* 12.0 - 15.0 g/dL Final  . HCT 62/95/284112/21/2016 28.3* 36.0 - 46.0 % Final  . MCV 11/16/2015 111.9 Repeated and verified X2.* 78.0 - 100.0 fl Final  . MCHC 11/16/2015 34.4  30.0 - 36.0 g/dL Final  . RDW 32/44/010212/21/2016 15.1  11.5 - 15.5 % Final  . Platelets 11/16/2015 87.0* 150.0 - 400.0 K/uL Final  . Neutrophils Relative % 11/16/2015 76.4  43.0 - 77.0 % Final  . Lymphocytes Relative 11/16/2015 13.9  12.0 - 46.0 % Final  . Monocytes Relative 11/16/2015 8.2  3.0 - 12.0 % Final  . Eosinophils Relative 11/16/2015 1.0  0.0 - 5.0 % Final  . Basophils  Relative 11/16/2015 0.5  0.0 - 3.0 % Final  . Neutro Abs 11/16/2015 3.2  1.4 - 7.7 K/uL Final  . Lymphs Abs 11/16/2015 0.6* 0.7 - 4.0 K/uL Final  . Monocytes Absolute 11/16/2015 0.3  0.1 - 1.0 K/uL Final  . Eosinophils Absolute 11/16/2015 0.0  0.0 - 0.7 K/uL Final  . Basophils Absolute 11/16/2015 0.0  0.0 - 0.1 K/uL Final     STUDIES: Dg Chest 2 View  11/23/2015  CLINICAL DATA:  70 year old female with chronic cough, slightly blood-tinged. History of COPD. Current smoker. EXAM: CHEST  2 VIEW COMPARISON:  No priors. FINDINGS: Mild diffuse peribronchial cuffing. There is a suggestion of some tram track opacities in the lingula and right middle lobe, concerning for areas of cylindrical  bronchiectasis. No acute consolidative airspace disease. No pleural effusions. No evidence of pulmonary edema. Heart size is normal. Upper mediastinal contours are within normal limits. Atherosclerosis in the thoracic aorta. IMPRESSION: 1. Diffuse peribronchial cuffing, suggestive of bronchitis, which could be acute or chronic. 2. In addition, there is evidence suggestive of areas of bronchiectasis in the right middle lobe and lingula. In this demographic, this raises concern for possible indolent atypical infection such as mycobacterium avium intracellulare (MAI). Further evaluation with high-resolution chest CT is suggested in the near future to better characterize these findings. Electronically Signed   By: Trudie Reed M.D.   On: 11/23/2015 13:41    ASSESSMENT and MEDICAL DECISION MAKING:  Macrocytic anemia-likely a combination of EtOH liver damage along with vitamin B12 and folate deficiency. Patient is currently on an appropriate vitamin B12 supplementation (reported vitamin B12 levels were at the low limit of normal, so we will attempt to confirm the frank vitamin B12 deficiency by checking methylmalonic acid, but since the patient has already received 2 injections of vitamin B12, methylmalonic acid levels  might be within normal range by now). We should start patient on folate supplementation, administering at least 1 mg a day in the form of folic acid or prenatal vitamin. We will check red blood cell folate as a more reliable measure of a long-term folate levels. We will request ultrasound of the abdomen, looking for evidence of alcoholic hepatitis or cirrhosis along with signs of portal hypertension, and splenomegaly. Since prolonged and active abuse of alcohol can lead to erosive gastritis, as well as erosive esophagitis and varicose bleeding, so we will check ferritin. Thrombocytopenia-either a consequence of direct toxic effect of alcohol on the bone marrow, or a sign of hypersplenism. In any case, current platelet numbers should not lead to easy bleeding or bruisability. Hemoptysis-possibly, related to coughing up swallowed blood from that bleeding gums, but could be a sign of bronchiectasis. Physical exam does not show signs of ecchymosis, petechia.   Patient expressed understanding and was in agreement with this plan. She also understands that She can call clinic at any time with any questions, concerns, or complaints.    No matching staging information was found for the patient.  Gorden Harms, MD   11/29/2015 3:55 PM

## 2015-11-30 ENCOUNTER — Ambulatory Visit (INDEPENDENT_AMBULATORY_CARE_PROVIDER_SITE_OTHER): Payer: PPO

## 2015-11-30 ENCOUNTER — Telehealth: Payer: Self-pay | Admitting: *Deleted

## 2015-11-30 DIAGNOSIS — E538 Deficiency of other specified B group vitamins: Secondary | ICD-10-CM | POA: Diagnosis not present

## 2015-11-30 LAB — PROTEIN ELECTROPHORESIS, SERUM
A/G Ratio: 0.6 — ABNORMAL LOW (ref 0.7–1.7)
ALBUMIN ELP: 3.1 g/dL (ref 2.9–4.4)
ALPHA-2-GLOBULIN: 0.8 g/dL (ref 0.4–1.0)
Alpha-1-Globulin: 0.3 g/dL (ref 0.0–0.4)
BETA GLOBULIN: 1.4 g/dL — AB (ref 0.7–1.3)
Gamma Globulin: 2.5 g/dL — ABNORMAL HIGH (ref 0.4–1.8)
Globulin, Total: 5 g/dL — ABNORMAL HIGH (ref 2.2–3.9)
Total Protein ELP: 8.1 g/dL (ref 6.0–8.5)

## 2015-11-30 MED ORDER — CYANOCOBALAMIN 1000 MCG/ML IJ SOLN
1000.0000 ug | Freq: Once | INTRAMUSCULAR | Status: AC
  Administered 2015-11-30: 1000 ug via INTRAMUSCULAR

## 2015-11-30 NOTE — Telephone Encounter (Signed)
Daughter called today Heather Rodgers(Heather Rodgers) that had brought pt yest. And has permission to speak about her care and she wanted to see if any labs had come back.  She states that her Mom's dizziness is worse today and she started thinking that this is how her Dad was and he had cirrhosis.  She knows that she told the doctor that she has cut back on drinking but she feels she did not cut back at all. She feels like she is seeing her Mom act the same as her Dad and he died in 8 months after being diagnosed.  I advised her that cbc hgb was higher than it was  On last draw.  The ferritin was normal. And LDH normal and the other tests still pending because they are sent out to [facility].  The u/s is sch. For next week and I told daughter that she can call us the next day after u/s and get results and she will.

## 2015-11-30 NOTE — Progress Notes (Signed)
Patient came in for B12 injection.  Received in right deltoid.  Patient tolerated well.  FYI: Patient seemed very weak, off today and daughter stated that they had a hematology visit yesterday, she is wore out.  Patient was unsteady on her feet.  Asked daughter if she wanted assistance to her car.  I assisted the patient and daughter to her car and daughter returned to front desk to schedule next appointment.  I told her to let us know if she needed assistance with anything.  She said today was just a bad day for her mom.  Patient was thankful for assistance to the car.

## 2015-11-30 NOTE — Telephone Encounter (Signed)
Also while daughter was on the phone I also told her that I Dr. Gretel AcreBerenzon needed pt to start folic acid pill which is usually found in prenatal vitamins which would also be good for her Mom to take.  If you can find prenatal vitamin with 1 mg folic acid and take ine a day that would be good. Daughter states she will find it for her and start her on it this week.

## 2015-12-01 ENCOUNTER — Telehealth: Payer: Self-pay | Admitting: *Deleted

## 2015-12-01 ENCOUNTER — Encounter: Payer: Self-pay | Admitting: Emergency Medicine

## 2015-12-01 ENCOUNTER — Inpatient Hospital Stay
Admission: EM | Admit: 2015-12-01 | Discharge: 2015-12-05 | DRG: 896 | Disposition: A | Discharge status: Skilled Nursing Facility | Payer: PPO | Attending: Internal Medicine | Admitting: Internal Medicine

## 2015-12-01 ENCOUNTER — Emergency Department: Payer: PPO

## 2015-12-01 DIAGNOSIS — A047 Enterocolitis due to Clostridium difficile: Secondary | ICD-10-CM | POA: Diagnosis present

## 2015-12-01 DIAGNOSIS — G9341 Metabolic encephalopathy: Secondary | ICD-10-CM | POA: Diagnosis present

## 2015-12-01 DIAGNOSIS — G4089 Other seizures: Secondary | ICD-10-CM | POA: Diagnosis present

## 2015-12-01 DIAGNOSIS — Z7982 Long term (current) use of aspirin: Secondary | ICD-10-CM

## 2015-12-01 DIAGNOSIS — F1096 Alcohol use, unspecified with alcohol-induced persisting amnestic disorder: Secondary | ICD-10-CM | POA: Diagnosis not present

## 2015-12-01 DIAGNOSIS — F17210 Nicotine dependence, cigarettes, uncomplicated: Secondary | ICD-10-CM | POA: Diagnosis not present

## 2015-12-01 DIAGNOSIS — S0003XA Contusion of scalp, initial encounter: Secondary | ICD-10-CM | POA: Diagnosis present

## 2015-12-01 DIAGNOSIS — M1990 Unspecified osteoarthritis, unspecified site: Secondary | ICD-10-CM | POA: Diagnosis present

## 2015-12-01 DIAGNOSIS — F1010 Alcohol abuse, uncomplicated: Secondary | ICD-10-CM | POA: Diagnosis not present

## 2015-12-01 DIAGNOSIS — D6959 Other secondary thrombocytopenia: Secondary | ICD-10-CM | POA: Diagnosis not present

## 2015-12-01 DIAGNOSIS — D696 Thrombocytopenia, unspecified: Secondary | ICD-10-CM | POA: Diagnosis not present

## 2015-12-01 DIAGNOSIS — R569 Unspecified convulsions: Secondary | ICD-10-CM

## 2015-12-01 DIAGNOSIS — Z79899 Other long term (current) drug therapy: Secondary | ICD-10-CM | POA: Diagnosis not present

## 2015-12-01 DIAGNOSIS — D649 Anemia, unspecified: Secondary | ICD-10-CM | POA: Diagnosis not present

## 2015-12-01 DIAGNOSIS — R509 Fever, unspecified: Secondary | ICD-10-CM

## 2015-12-01 DIAGNOSIS — Z720 Tobacco use: Secondary | ICD-10-CM | POA: Diagnosis not present

## 2015-12-01 DIAGNOSIS — I10 Essential (primary) hypertension: Secondary | ICD-10-CM | POA: Diagnosis present

## 2015-12-01 DIAGNOSIS — F10239 Alcohol dependence with withdrawal, unspecified: Secondary | ICD-10-CM | POA: Diagnosis not present

## 2015-12-01 DIAGNOSIS — W19XXXA Unspecified fall, initial encounter: Secondary | ICD-10-CM | POA: Diagnosis not present

## 2015-12-01 DIAGNOSIS — S0093XA Contusion of unspecified part of head, initial encounter: Secondary | ICD-10-CM | POA: Diagnosis not present

## 2015-12-01 DIAGNOSIS — Y92000 Kitchen of unspecified non-institutional (private) residence as  the place of occurrence of the external cause: Secondary | ICD-10-CM | POA: Diagnosis not present

## 2015-12-01 LAB — GLUCOSE, CAPILLARY: GLUCOSE-CAPILLARY: 111 mg/dL — AB (ref 65–99)

## 2015-12-01 LAB — BASIC METABOLIC PANEL
Anion gap: 15 (ref 5–15)
BUN: 21 mg/dL — AB (ref 6–20)
CALCIUM: 9.1 mg/dL (ref 8.9–10.3)
CO2: 21 mmol/L — ABNORMAL LOW (ref 22–32)
CREATININE: 0.95 mg/dL (ref 0.44–1.00)
Chloride: 98 mmol/L — ABNORMAL LOW (ref 101–111)
GFR calc Af Amer: >60 mL/min (ref >60)
Glucose, Bld: 111 mg/dL — ABNORMAL HIGH (ref 65–99)
POTASSIUM: 3 mmol/L — AB (ref 3.5–5.1)
SODIUM: 134 mmol/L — AB (ref 135–145)

## 2015-12-01 LAB — CBC
HCT: 27.5 % — ABNORMAL LOW (ref 35.0–47.0)
Hemoglobin: 9.3 g/dL — ABNORMAL LOW (ref 12.0–16.0)
MCH: 36.2 pg — ABNORMAL HIGH (ref 26.0–34.0)
MCHC: 33.9 g/dL (ref 32.0–36.0)
MCV: 106.8 fL — ABNORMAL HIGH (ref 80.0–100.0)
PLATELETS: 78 10*3/uL — AB (ref 150–440)
RBC: 2.58 MIL/uL — ABNORMAL LOW (ref 3.80–5.20)
RDW: 14.3 % (ref 11.5–14.5)
WBC: 4.3 10*3/uL (ref 3.6–11.0)

## 2015-12-01 LAB — FOLATE RBC
FOLATE, HEMOLYSATE: 237.6 ng/mL
FOLATE, RBC: 874 ng/mL (ref >498)
HEMATOCRIT: 27.2 % — AB (ref 34.0–46.6)

## 2015-12-01 LAB — CK: CK TOTAL: 148 U/L (ref 38–234)

## 2015-12-01 LAB — METHYLMALONIC ACID, SERUM: METHYLMALONIC ACID, QUANTITATIVE: 59 nmol/L (ref 0–378)

## 2015-12-01 MED ORDER — LORAZEPAM 2 MG/ML IJ SOLN
0.0000 mg | Freq: Four times a day (QID) | INTRAMUSCULAR | Status: AC
Start: 2015-12-01 — End: 2015-12-03
  Administered 2015-12-02 – 2015-12-03 (×4): 2 mg via INTRAVENOUS
  Filled 2015-12-01 (×4): qty 1

## 2015-12-01 MED ORDER — DIAZEPAM 5 MG/ML IJ SOLN
5.0000 mg | Freq: Once | INTRAMUSCULAR | Status: AC
  Administered 2015-12-01: 5 mg via INTRAVENOUS
  Filled 2015-12-01: qty 2

## 2015-12-01 MED ORDER — LORAZEPAM 2 MG/ML IJ SOLN
0.0000 mg | Freq: Two times a day (BID) | INTRAMUSCULAR | Status: AC
  Administered 2015-12-03: 2 mg via INTRAVENOUS
  Filled 2015-12-01 (×2): qty 1

## 2015-12-01 MED ORDER — THIAMINE HCL 100 MG/ML IJ SOLN: 100.0000 mg | Freq: Every day | INTRAMUSCULAR | Status: DC

## 2015-12-01 MED ORDER — POTASSIUM CHLORIDE CRYS ER 20 MEQ PO TBCR
40.0000 meq | EXTENDED_RELEASE_TABLET | Freq: Once | ORAL | Status: AC
  Administered 2015-12-01: 40 meq via ORAL
  Filled 2015-12-01: qty 2

## 2015-12-01 MED ORDER — FOLIC ACID 1 MG PO TABS: 1.0000 mg | ORAL_TABLET | Freq: Every day | ORAL | Status: DC

## 2015-12-01 MED ORDER — VITAMIN B-1 100 MG PO TABS
100.0000 mg | ORAL_TABLET | Freq: Every day | ORAL | Status: DC
  Administered 2015-12-01 – 2015-12-05 (×4): 100 mg via ORAL
  Filled 2015-12-01 (×5): qty 1

## 2015-12-01 MED ORDER — THIAMINE HCL 100 MG/ML IJ SOLN
100.0000 mg | Freq: Every day | INTRAMUSCULAR | Status: DC
  Administered 2015-12-03: 100 mg via INTRAVENOUS
  Filled 2015-12-01: qty 2

## 2015-12-01 MED ORDER — LORAZEPAM 2 MG PO TABS
0.0000 mg | ORAL_TABLET | Freq: Four times a day (QID) | ORAL | Status: AC
  Administered 2015-12-02: 2 mg via ORAL
  Filled 2015-12-01: qty 1

## 2015-12-01 MED ORDER — FOLIC ACID 5 MG/ML IJ SOLN
1.0000 mg | Freq: Every day | INTRAMUSCULAR | Status: DC
  Administered 2015-12-02: 1 mg via INTRAVENOUS
  Filled 2015-12-01 (×4): qty 0.2

## 2015-12-01 MED ORDER — LORAZEPAM 2 MG PO TABS
0.0000 mg | ORAL_TABLET | Freq: Two times a day (BID) | ORAL | Status: DC
  Administered 2015-12-02 – 2015-12-03 (×2): 2 mg via ORAL
  Administered 2015-12-04: 1 mg via ORAL
  Administered 2015-12-04: 2 mg via ORAL
  Filled 2015-12-01 (×4): qty 1

## 2015-12-01 NOTE — ED Notes (Signed)
Patient's family states patient has been drinking more than just 3 glasses of wine a day but also drinks heavy consumption of vodka.

## 2015-12-01 NOTE — ED Notes (Signed)
MD at bedside. 

## 2015-12-01 NOTE — ED Notes (Signed)
Patient transported to CT 

## 2015-12-01 NOTE — ED Notes (Signed)
Pt comes into the ED via EMS from home c/o seizure with unknown length of time of episode.  Patient has no history of seizures.  Patient had a fall this morning according to patient's daughter.  Hematoma present on posterior head.  Patient bit tongue in process of seizure.  Seizure was described as rigidity and shaking. Patient does not remember the fall or the seizure and was postictal for roughly 6 minutes with EMS.  H/o ETOH abuse and currently is trying to dwindle down.  CBG 125, 183/102, 99-100 HR, 95 % room air.

## 2015-12-01 NOTE — Telephone Encounter (Signed)
E scribed [name] notified. [name] then informed me that she was unable to get in touch with her mother this morning and so she went to her house only to find her lying in the kitchen floor with a huge "goose egg on her head. She refused to have EMS called or to go to ER" I advised that she call the PCP for follow up

## 2015-12-01 NOTE — ED Notes (Signed)
Patient returned from radiology

## 2015-12-01 NOTE — ED Provider Notes (Signed)
[facility] Emergency Department Provider Note  ____________________________________________  Time seen: 7:00 PM on arrival by EMS  I have reviewed the triage vital signs and the nursing notes.   HISTORY  Chief Complaint Seizures  history obtained from EMS and patient and later by family on their arrival.   HPI Heather Rodgers is a 70 y.o. female brought to the ED for a fall this morning and a seizure. Patient is reported to be having generalized convulsive activity with a postictal phase. Family reports the patient has been having multiple falls and "blackouts" over the past 2 months and is seen multiple specialists without a clear diagnosis. She has a first appointment with neurology in about 2 weeks.He also reports that the patient has had a lot of confusion recently and just now here in the emergency department the patient was unable to recognize her daughter. The patient reports that she drinks about 3 glasses of wine a day, but the family reports that she has a heavy vodka drinker and that the last 2 days she has run out of vodka and is therefore binge drinking wine to try and keep up with her habit.     Past Medical History  Diagnosis Date  . Arthritis   . Hypertension   . Kidney stones   . Anemia      Patient Active Problem List   Diagnosis Date Noted  . Anemia due to chronic blood loss 11/16/2015  . Cough 11/16/2015  . B12 deficiency anemia 11/16/2015  . Left arm numbness 11/16/2015  . Encounter to establish care 09/28/2015  . Dizziness 09/28/2015  . Arthritis 09/19/2015  . HLD (hyperlipidemia) 09/19/2015  . BP (high blood pressure) 09/19/2015  . Arthritis, degenerative 09/19/2015  . Abnormal presence of protein in urine 09/19/2015  . Psoriasis 09/19/2015  . Chronic obstructive pulmonary disease (HCC) 06/11/2014  . Breathlessness on exertion 06/11/2014  . Current tobacco use 02/24/2014  . AA (alcohol abuse) 04/16/2013     History  reviewed. No pertinent past surgical history.   Current Outpatient Rx  Name  Route  Sig  Dispense  Refill  . aspirin EC 81 MG tablet   Oral   Take 1 tablet by mouth daily.         . Cholecalciferol (D 5000) 5000 UNITS TABS   Oral   Take 1 tablet by mouth daily.         . folic acid (FOLVITE) 1 MG tablet   Oral   Take 1 tablet (1 mg total) by mouth daily.   30 tablet   2   . gabapentin (NEURONTIN) 300 MG capsule   Oral   Take 1 capsule (300 mg total) by mouth at bedtime.   30 capsule   0   . losartan (COZAAR) 100 MG tablet   Oral   Take 1 tablet (100 mg total) by mouth daily.   30 tablet   2   . meclizine (ANTIVERT) 25 MG tablet   Oral   Take 25 mg by mouth 3 (three) times daily as needed for dizziness.         . meloxicam (MOBIC) 15 MG tablet   Oral   Take 15 mg by mouth daily.            Allergies Penicillins   Family History  Problem Relation Age of Onset  . Heart disease Mother   . Hypertension Mother   . Arthritis Father   . Heart disease Father   .  Hypertension Father     Social History Social History  Substance Use Topics  . Smoking status: Current Every Day Smoker -- 1.00 packs/day    Types: Cigarettes    Start date: 09/19/1995  . Smokeless tobacco: Never Used     Comment: Has patches  . Alcohol Use: 0.0 oz/week    0 Standard drinks or equivalent per week     Comment: 2-3 shots daily     Review of Systems  Constitutional:   No fever or chills. No weight changes Eyes:   No blurry vision or double vision.  ENT:   No sore throat. Cardiovascular:   No chest pain. Respiratory:   No dyspnea or cough. Gastrointestinal:   Negative for abdominal pain, vomiting and diarrhea.  No BRBPR or melena. Genitourinary:   Negative for dysuria, urinary retention, bloody urine, or difficulty urinating. Musculoskeletal:   Negative for back pain. No joint swelling or pain. Skin:   Negative for rash. Neurological:   Positive headaches and  confusion.  Psychiatric:  No anxiety or depression.   Endocrine:  No hot/cold intolerance, changes in energy, or sleep difficulty.  10-point ROS otherwise negative.  ____________________________________________   PHYSICAL EXAM:  VITAL SIGNS: ED Triage Vitals  Enc Vitals Group     BP 12/01/15 1900 162/104 mmHg     Pulse Rate 12/01/15 1900 101     Resp 12/01/15 1900 20     Temp 12/01/15 1901 98.1 F (36.7 C)     Temp Source 12/01/15 1901 Oral     SpO2 12/01/15 1900 97 %     Weight 12/01/15 1901 142 lb (64.411 kg)     Height 12/01/15 1901 5\' 2"  (1.575 m)     Head Cir --      Peak Flow --      Pain Score --      Pain Loc --      Pain Edu? --      Excl. in GC? --     Vital signs reviewed, nursing assessments reviewed.   Constitutional:   Alert and oriented to self. Well appearing and in no distress. Eyes:   No scleral icterus. No conjunctival pallor. PERRL. EOMI. No nystagmus ENT   Head:   Normocephalic with posterior scalp hematoma, no laceration or bleeding.   Nose:   No congestion/rhinnorhea. No septal hematoma   Mouth/Throat:   MMM, no pharyngeal erythema. No peritonsillar mass. No uvula shift. Evidence of left tongue biting with a small subcentimeter laceration that is hemostatic and not gaping.   Neck:   No stridor. No SubQ emphysema. No meningismus. No midline tenderness Hematological/Lymphatic/Immunilogical:   No cervical lymphadenopathy. Cardiovascular:   RRR. Normal and symmetric distal pulses are present in all extremities. No murmurs, rubs, or gallops. Respiratory:   Normal respiratory effort without tachypnea nor retractions. Breath sounds are clear and equal bilaterally. No wheezes/rales/rhonchi. Gastrointestinal:   Soft and nontender. No distention. There is no CVA tenderness.  No rebound, rigidity, or guarding. Genitourinary:   deferred Musculoskeletal:   Nontender with normal range of motion in all extremities. No joint effusions.  No lower  extremity tenderness.  No edema. Neurologic:   Normal speech and language.  CN 2-10 normal. Motor grossly intact. Profoundly ataxic. Positive Romberg  No gross focal neurologic deficits are appreciated.  Skin:    Skin is warm, dry and intact. No rash noted.  No petechiae, purpura, or bullae. Psychiatric:   Mood and affect are normal. Speech and behavior are normal.  Patient exhibits appropriate insight and judgment.  ____________________________________________    LABS (pertinent positives/negatives) (all labs ordered are listed, but only abnormal results are displayed) Labs Reviewed  BASIC METABOLIC PANEL - Abnormal; Notable for the following:    Sodium 134 (*)    Potassium 3.0 (*)    Chloride 98 (*)    CO2 21 (*)    Glucose, Bld 111 (*)    BUN 21 (*)    All other components within normal limits  CBC - Abnormal; Notable for the following:    RBC 2.58 (*)    Hemoglobin 9.3 (*)    HCT 27.5 (*)    MCV 106.8 (*)    MCH 36.2 (*)    Platelets 78 (*)    All other components within normal limits  GLUCOSE, CAPILLARY - Abnormal; Notable for the following:    Glucose-Capillary 111 (*)    All other components within normal limits  CK  CBG MONITORING, ED   ____________________________________________   EKG  Interpreted by me Normal sinus rhythm rate of 98, normal axis and intervals. Poor R-wave progression in anterior precordial leads. Normal ST segments and T waves  ____________________________________________    RADIOLOGY  CT head unremarkable  ____________________________________________   PROCEDURES   ____________________________________________   INITIAL IMPRESSION / ASSESSMENT AND PLAN / ED COURSE  Pertinent labs & imaging results that were available during my care of the patient were reviewed by me and considered in my medical decision making (see chart for details).  Patient presents with new-onset seizure, multiple falls and worsening confusion. We'll  check labs CT head.  ----------------------------------------- 9:11 PM on 12/01/2015 -----------------------------------------  Workup reveals evidence of chronic malnutrition including Lopressor platelets and high MCV and chronic anemia. Labs otherwise unremarkable. CT unremarkable except for scalp hematoma. With ataxia and confusion and heavy alcohol use this is consistent with Wernicke's Korsakoff syndrome. Case discussed with the hospitals for admission. Will give thiamine and folate. ciwa protocol. IV Valium due to gradually increasing blood pressure in case she is developing some autonomic instability. No clear alcohol withdrawal at present time but we are unable to pinpoint exactly how much she drinks and how much she might of decreased that recently.     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

## 2015-12-01 NOTE — H&P (Signed)
[facility] - [place] at [facility]   PATIENT NAME: Heather Rodgers    MR#:  [id number]  DATE OF BIRTH:  [birth year]  DATE OF ADMISSION:  12/01/2015  PRIMARY CARE PHYSICIAN: Carollee Leitz, NP   REQUESTING/REFERRING PHYSICIAN: dr Scotty Court  CHIEF COMPLAINT:   Found lying on the kitchen floor this morning, unsteady gait, increased alcohol use. HISTORY OF PRESENT ILLNESS:  Heather Rodgers  is a 70 y.o. female with a known history of chronic alcohol abuse, chronic tobacco abuse, hypertension, chronic thrombocytopenia with anemia comes to the emergency room after after she was found in the kitchen floor this morning by her family. Per daughter's patient lately has been having falls with unsteady gait at home. She was noted to have tonic seizures today with tongue bite. Denies urinary or fecal incontinence. Patient had a fall this morning and has swelling in the posterior part of her skull. CT head showed crescentic hematoma on the left posterior parietal calvarium. Patient is being admitted for further pressure management. According to the daughters and patient has been drinking heavily. She is started on CIWA protocol in the emergency room.  PAST MEDICAL HISTORY:   Past Medical History  Diagnosis Date  . Arthritis   . Hypertension   . Kidney stones   . Anemia     PAST SURGICAL HISTOIRY:  History reviewed. No pertinent past surgical history.  SOCIAL HISTORY:   Social History  Substance Use Topics  . Smoking status: Current Every Day Smoker -- 1.00 packs/day    Types: Cigarettes    Start date: 09/19/1995  . Smokeless tobacco: Never Used     Comment: Has patches  . Alcohol Use: 0.0 oz/week    0 Standard drinks or equivalent per week     Comment: 2-3 shots daily     FAMILY HISTORY:   Family History  Problem Relation Age of Onset  . Heart disease Mother   . Hypertension Mother   . Arthritis Father   . Heart disease Father   . Hypertension Father      DRUG ALLERGIES:   Allergies  Allergen Reactions  . Penicillins Hives    REVIEW OF SYSTEMS:  Review of Systems  Constitutional: Negative for fever, chills and weight loss.  HENT: Negative for ear discharge, ear pain and nosebleeds.   Eyes: Negative for blurred vision, pain and discharge.  Respiratory: Negative for sputum production, shortness of breath, wheezing and stridor.   Cardiovascular: Negative for chest pain, palpitations, orthopnea and PND.  Gastrointestinal: Negative for nausea, vomiting, abdominal pain and diarrhea.  Genitourinary: Negative for urgency and frequency.  Musculoskeletal: Positive for joint pain and falls. Negative for back pain.  Neurological: Positive for seizures and weakness. Negative for sensory change, speech change and focal weakness.  Psychiatric/Behavioral: Negative for depression and hallucinations. The patient is not nervous/anxious.      MEDICATIONS AT HOME:   Prior to Admission medications   Medication Sig Start Date End Date Taking? Authorizing Provider  aspirin EC 81 MG tablet Take 1 tablet by mouth daily.   Yes Historical Provider, MD  Cholecalciferol (D 5000) 5000 UNITS TABS Take 1 tablet by mouth daily.   Yes Historical Provider, MD  folic acid (FOLVITE) 1 MG tablet Take 1 mg by mouth daily.   Yes Historical Provider, MD  gabapentin (NEURONTIN) 300 MG capsule Take 1 capsule (300 mg total) by mouth at bedtime. 11/09/15  Yes Carollee Leitz, NP  losartan (COZAAR) 100 MG tablet Take 1  tablet (100 mg total) by mouth daily. 09/19/15  Yes Carollee Leitzarrie M Doss, NP  meclizine (ANTIVERT) 25 MG tablet Take 25 mg by mouth 3 (three) times daily as needed for dizziness.   Yes Historical Provider, MD  meloxicam (MOBIC) 15 MG tablet Take 15 mg by mouth daily.   Yes Historical Provider, MD      VITAL SIGNS:  Blood pressure 166/104, pulse 94, temperature 98.1 F (36.7 C), temperature source Oral, resp. rate 16, height 5\' 2"  (1.575 m), weight 64.411 kg (142  lb), SpO2 96 %.  PHYSICAL EXAMINATION:  GENERAL:  70 y.o.-year-old patient lying in the bed with no acute distress.  EYES: Pupils equal, round, reactive to light and accommodation. No scleral icterus. Extraocular muscles intact.  HEENT: Hematoma on the left posterior part of the skull., normocephalic. Oropharynx and nasopharynx clear. Tongue bite present. NECK:  Supple, no jugular venous distention. No thyroid enlargement, no tenderness.  LUNGS: Normal breath sounds bilaterally, no wheezing, rales,rhonchi or crepitation. No use of accessory muscles of respiration.  CARDIOVASCULAR: S1, S2 normal. No murmurs, rubs, or gallops.  ABDOMEN: Soft, nontender, nondistended. Bowel sounds present. No organomegaly or mass.  EXTREMITIES: No pedal edema, cyanosis, or clubbing.  NEUROLOGIC: Cranial nerves II through XII are intact. Muscle strength 5/5 in all extremities. Sensation intact. Gait not checked.  PSYCHIATRIC: The patient is alert and oriented x 3.  SKIN: No obvious rash, lesion, or ulcer.   LABORATORY PANEL:   CBC  Recent Labs Lab 12/01/15 1911  WBC 4.3  HGB 9.3*  HCT 27.5*  PLT 78*   ------------------------------------------------------------------------------------------------------------------  Chemistries   Recent Labs Lab 12/01/15 1911  NA 134*  K 3.0*  CL 98*  CO2 21*  GLUCOSE 111*  BUN 21*  CREATININE 0.95  CALCIUM 9.1   ------------------------------------------------------------------------------------------------------------------  Cardiac Enzymes No results for input(s): TROPONINI in the last 168 hours. ------------------------------------------------------------------------------------------------------------------  RADIOLOGY:  Ct Head Wo Contrast  12/01/2015  CLINICAL DATA:  Seizure.  History of alcohol abuse. EXAM: CT HEAD WITHOUT CONTRAST TECHNIQUE: Contiguous axial images were obtained from the base of the skull through the vertex without intravenous  contrast. COMPARISON:  None. FINDINGS: There is a crescentic hematoma about the high left posterior parietal calvarium which measures approximately 1.0 x 6.6 cm (image 24, series 2). No associated radiopaque foreign body or displaced calvarial fracture. Scattered periventricular hypodensities compatible with microvascular ischemic disease. 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 No CT evidence acute large territory infarct. No intraparenchymal or extra-axial mass or hemorrhage. Normal size and configuration of the ventricles and basilar cisterns. No midline shift. Limited visualization of the paranasal sinuses and mastoid air cells is normal. No air-fluid levels. IMPRESSION: 1. Crescentic hematoma about the left posterior parietal calvarium without associated radiopaque foreign body, displaced calvarial fracture or acute intracranial process. 2. Sequela of microvascular ischemic disease as above. Electronically Signed   By: Simonne ComeJohn  Watts M.D.   On: 12/01/2015 19:30    EKG:   Sinus tachycardia. A triple premature beats IMPRESSION AND PLAN:   Heather Rodgers  is a 70 y.o. female with a known history of chronic alcohol abuse, chronic tobacco abuse, hypertension, chronic thrombocytopenia with anemia comes to the emergency room after after she was found in the kitchen floor this morning by her family. Per daughter's patient lately has been having falls with unsteady gait at home.  1. Seizure disorder, new onset appears alcohol-induced given history of heavy alcohol consumption according to the  patient and her daughters -CIWA protocol with IV Ativan -Neurology consultation -Seizure precautions -Patient advised alcohol cessation  2. Left posterior parietal hematoma status post fall -Avoid aspirin, NSAIDs  3. Hypertension continue her losartan  4. Chronic thrombocytopenia secondary to alcohol abuse with history of chronic anemia Avoid  antiplatelet agents No indication for platelet transfusion.  5. Chronic tobacco abuse discussed with patient about smoking cessation 4 minutes spent  Above was discussed with patient and patient's daughter present in the emergency room.  PT to see the morning for unsteady gait. Social worker for discharge planning    All the records are reviewed and case discussed with ED provider. Management plans discussed with the patient, family and they are in agreement.  CODE STATUS: Full  TOTAL TIME TAKING CARE OF THIS PATIENT: 50 minutes.    Heather Rodgers M.D on 12/01/2015 at 9:19 PM  Between 7am to 6pm - Pager - [phone]  After 6pm go to [email or web address] - password [email or web address] [facility]  [place] [facility]  Office  [phone]  CC: Primary care physician; Carollee Leitz, NP

## 2015-12-02 ENCOUNTER — Inpatient Hospital Stay: Payer: PPO

## 2015-12-02 ENCOUNTER — Telehealth: Payer: Self-pay | Admitting: Nurse Practitioner

## 2015-12-02 DIAGNOSIS — R569 Unspecified convulsions: Secondary | ICD-10-CM | POA: Diagnosis not present

## 2015-12-02 DIAGNOSIS — I10 Essential (primary) hypertension: Secondary | ICD-10-CM | POA: Diagnosis not present

## 2015-12-02 DIAGNOSIS — S0003XA Contusion of scalp, initial encounter: Secondary | ICD-10-CM | POA: Diagnosis not present

## 2015-12-02 DIAGNOSIS — F1010 Alcohol abuse, uncomplicated: Secondary | ICD-10-CM | POA: Diagnosis not present

## 2015-12-02 DIAGNOSIS — G40909 Epilepsy, unspecified, not intractable, without status epilepticus: Secondary | ICD-10-CM | POA: Diagnosis not present

## 2015-12-02 DIAGNOSIS — R509 Fever, unspecified: Secondary | ICD-10-CM | POA: Diagnosis not present

## 2015-12-02 LAB — URINALYSIS COMPLETE WITH MICROSCOPIC (ARMC ONLY)
BILIRUBIN URINE: NEGATIVE
Glucose, UA: NEGATIVE mg/dL
KETONES UR: NEGATIVE mg/dL
Leukocytes, UA: NEGATIVE
Nitrite: NEGATIVE
PROTEIN: 100 mg/dL — AB
Specific Gravity, Urine: 1.01 (ref 1.005–1.030)
pH: 8 (ref 5.0–8.0)

## 2015-12-02 LAB — CBC
HCT: 26.9 % — ABNORMAL LOW (ref 35.0–47.0)
Hemoglobin: 9.1 g/dL — ABNORMAL LOW (ref 12.0–16.0)
MCH: 36.2 pg — ABNORMAL HIGH (ref 26.0–34.0)
MCHC: 33.7 g/dL (ref 32.0–36.0)
MCV: 107.6 fL — ABNORMAL HIGH (ref 80.0–100.0)
PLATELETS: 74 10*3/uL — AB (ref 150–440)
RBC: 2.5 MIL/uL — ABNORMAL LOW (ref 3.80–5.20)
RDW: 14.4 % (ref 11.5–14.5)
WBC: 5.5 10*3/uL (ref 3.6–11.0)

## 2015-12-02 LAB — BASIC METABOLIC PANEL
Anion gap: 12 (ref 5–15)
BUN: 18 mg/dL (ref 6–20)
CHLORIDE: 100 mmol/L — AB (ref 101–111)
CO2: 22 mmol/L (ref 22–32)
Calcium: 8.7 mg/dL — ABNORMAL LOW (ref 8.9–10.3)
Creatinine, Ser: 0.75 mg/dL (ref 0.44–1.00)
GFR calc Af Amer: >60 mL/min (ref >60)
GLUCOSE: 88 mg/dL (ref 65–99)
POTASSIUM: 3.4 mmol/L — AB (ref 3.5–5.1)
Sodium: 134 mmol/L — ABNORMAL LOW (ref 135–145)

## 2015-12-02 LAB — MAGNESIUM: MAGNESIUM: 0.9 mg/dL — AB (ref 1.7–2.4)

## 2015-12-02 LAB — HEPATIC FUNCTION PANEL
ALK PHOS: 74 U/L (ref 38–126)
ALT: 12 U/L — AB (ref 14–54)
AST: 41 U/L (ref 15–41)
Albumin: 3.2 g/dL — ABNORMAL LOW (ref 3.5–5.0)
BILIRUBIN DIRECT: 0.5 mg/dL (ref 0.1–0.5)
BILIRUBIN INDIRECT: 1.1 mg/dL — AB (ref 0.3–0.9)
Total Bilirubin: 1.6 mg/dL — ABNORMAL HIGH (ref 0.3–1.2)
Total Protein: 8.2 g/dL — ABNORMAL HIGH (ref 6.5–8.1)

## 2015-12-02 MED ORDER — HYDRALAZINE HCL 20 MG/ML IJ SOLN
10.0000 mg | Freq: Four times a day (QID) | INTRAMUSCULAR | Status: DC | PRN
  Administered 2015-12-03 – 2015-12-04 (×2): 10 mg via INTRAVENOUS
  Filled 2015-12-02 (×4): qty 1

## 2015-12-02 MED ORDER — ONDANSETRON HCL 4 MG/2ML IJ SOLN: 4.0000 mg | Freq: Four times a day (QID) | INTRAMUSCULAR | Status: DC | PRN

## 2015-12-02 MED ORDER — DEXTROSE 5 % IV SOLN
1.0000 g | INTRAVENOUS | Status: DC
  Administered 2015-12-02 – 2015-12-05 (×4): 1 g via INTRAVENOUS
  Filled 2015-12-02 (×5): qty 10

## 2015-12-02 MED ORDER — ACETAMINOPHEN 325 MG PO TABS
650.0000 mg | ORAL_TABLET | Freq: Four times a day (QID) | ORAL | Status: DC | PRN
  Administered 2015-12-02: 650 mg via ORAL
  Filled 2015-12-02: qty 2

## 2015-12-02 MED ORDER — ADULT MULTIVITAMIN W/MINERALS CH
1.0000 | ORAL_TABLET | Freq: Every day | ORAL | Status: DC
  Administered 2015-12-02 – 2015-12-05 (×4): 1 via ORAL
  Filled 2015-12-02 (×4): qty 1

## 2015-12-02 MED ORDER — MAGNESIUM OXIDE 400 (241.3 MG) MG PO TABS
400.0000 mg | ORAL_TABLET | Freq: Every day | ORAL | Status: AC
  Administered 2015-12-03 – 2015-12-04 (×2): 400 mg via ORAL
  Filled 2015-12-02 (×2): qty 1

## 2015-12-02 MED ORDER — MAGNESIUM OXIDE 400 (241.3 MG) MG PO TABS
800.0000 mg | ORAL_TABLET | Freq: Once | ORAL | Status: AC
  Administered 2015-12-02: 800 mg via ORAL
  Filled 2015-12-02: qty 2

## 2015-12-02 MED ORDER — POTASSIUM CHLORIDE CRYS ER 20 MEQ PO TBCR
40.0000 meq | EXTENDED_RELEASE_TABLET | Freq: Once | ORAL | Status: AC
  Administered 2015-12-02: 40 meq via ORAL
  Filled 2015-12-02: qty 2

## 2015-12-02 MED ORDER — ONDANSETRON HCL 4 MG PO TABS: 4.0000 mg | ORAL_TABLET | Freq: Four times a day (QID) | ORAL | Status: DC | PRN

## 2015-12-02 MED ORDER — FOLIC ACID 1 MG PO TABS
1.0000 mg | ORAL_TABLET | Freq: Every day | ORAL | Status: DC
  Administered 2015-12-02 – 2015-12-05 (×4): 1 mg via ORAL
  Filled 2015-12-02 (×4): qty 1

## 2015-12-02 MED ORDER — MORPHINE SULFATE (PF) 2 MG/ML IV SOLN: 1.0000 mg | INTRAVENOUS | Status: DC | PRN

## 2015-12-02 MED ORDER — MECLIZINE HCL 25 MG PO TABS: 25.0000 mg | ORAL_TABLET | Freq: Three times a day (TID) | ORAL | Status: DC | PRN

## 2015-12-02 MED ORDER — LOSARTAN POTASSIUM 50 MG PO TABS
100.0000 mg | ORAL_TABLET | Freq: Every day | ORAL | Status: DC
  Administered 2015-12-02 – 2015-12-05 (×4): 100 mg via ORAL
  Filled 2015-12-02 (×5): qty 2

## 2015-12-02 MED ORDER — HYDRALAZINE HCL 20 MG/ML IJ SOLN
10.0000 mg | Freq: Four times a day (QID) | INTRAMUSCULAR | Status: DC | PRN
  Administered 2015-12-02: 10 mg via INTRAVENOUS

## 2015-12-02 MED ORDER — VITAMIN D 1000 UNITS PO TABS
5000.0000 [IU] | ORAL_TABLET | Freq: Every day | ORAL | Status: DC
  Administered 2015-12-02 – 2015-12-05 (×3): 5000 [IU] via ORAL
  Filled 2015-12-02 (×4): qty 5

## 2015-12-02 MED ORDER — POTASSIUM CHLORIDE IN NACL 40-0.9 MEQ/L-% IV SOLN
INTRAVENOUS | Status: DC
  Administered 2015-12-02 – 2015-12-05 (×5): 100 mL/h via INTRAVENOUS
  Filled 2015-12-02 (×13): qty 1000

## 2015-12-02 MED ORDER — VITAMIN B-12 1000 MCG PO TABS
2000.0000 ug | ORAL_TABLET | Freq: Every day | ORAL | Status: DC
  Administered 2015-12-02 – 2015-12-05 (×4): 2000 ug via ORAL
  Filled 2015-12-02 (×4): qty 2

## 2015-12-02 MED ORDER — ENSURE ENLIVE PO LIQD
237.0000 mL | Freq: Three times a day (TID) | ORAL | Status: DC
  Administered 2015-12-02 – 2015-12-05 (×9): 237 mL via ORAL

## 2015-12-02 MED ORDER — GABAPENTIN 300 MG PO CAPS
300.0000 mg | ORAL_CAPSULE | Freq: Every day | ORAL | Status: DC
  Administered 2015-12-02 – 2015-12-04 (×3): 300 mg via ORAL
  Filled 2015-12-02 (×3): qty 1

## 2015-12-02 MED ORDER — METOPROLOL TARTRATE 50 MG PO TABS
50.0000 mg | ORAL_TABLET | Freq: Two times a day (BID) | ORAL | Status: DC
  Administered 2015-12-02 – 2015-12-03 (×4): 50 mg via ORAL
  Filled 2015-12-02 (×4): qty 1

## 2015-12-02 MED ORDER — OXYCODONE HCL 5 MG PO TABS: 5.0000 mg | ORAL_TABLET | ORAL | Status: DC | PRN

## 2015-12-02 MED ORDER — MAGNESIUM SULFATE 4 GM/100ML IV SOLN
4.0000 g | Freq: Once | INTRAVENOUS | Status: AC
  Administered 2015-12-02: 4 g via INTRAVENOUS
  Filled 2015-12-02: qty 100

## 2015-12-02 MED ORDER — PNEUMOCOCCAL VAC POLYVALENT 25 MCG/0.5ML IJ INJ: 0.5000 mL | INJECTION | INTRAMUSCULAR | Status: DC

## 2015-12-02 NOTE — ED Notes (Signed)
Pt reports already having taken losartan earlier in the day.

## 2015-12-02 NOTE — Telephone Encounter (Signed)
Okay thank you! That is a shame, sorry to hear this! Will keep updated via Epic

## 2015-12-02 NOTE — Progress Notes (Signed)
Per daughter pt drinking 1/2 gallon of vodka daily.

## 2015-12-02 NOTE — Progress Notes (Signed)
PT Cancellation Note  Patient Details Name: Heather Rodgers MRN: [id number] DOB: 09-08-1946   Cancelled Treatment:    Reason Eval/Treat Not Completed: Medical issues which prohibited therapy. Patient noted to have neurology eval pending, given CT findings PT will defer mobility evaluation until further neuro recommendations provided.    Kerin RansomPatrick A Beni Turrell, PT, DPT    12/02/2015, 10:21 AM

## 2015-12-02 NOTE — ED Notes (Addendum)
RN Lowella BandyNikki called again regarding pt and blood pressure readings at this time. Nikki RN verbalized she will accept pt at this time.

## 2015-12-02 NOTE — Telephone Encounter (Signed)
Called pt to let her know when her CT chest is scheduled. Her daughter [name] answereLamar Laundryd and stated that Steward DroneBrenda is in the hospital. She suffered a fall and hit her head. After they did a CT of her head they sent her home d/t it being normal. After she returned home she started having seizures for which they had to call EMS to come and get her. She is still there. Her daughter just wanted to let you know.

## 2015-12-02 NOTE — Progress Notes (Signed)
[facility] - [place] at [facility]   PATIENT NAME: Heather Rodgers    MR#:  [id number]  DATE OF BIRTH:  1946-08-20  SUBJECTIVE:  CHIEF COMPLAINT:   Chief Complaint  Patient presents with  . Seizures   No further seizures No pain or nausea or vomiting. Family at bedside. REVIEW OF SYSTEMS:    Review of Systems  Constitutional: Positive for malaise/fatigue. Negative for fever and chills.  HENT: Negative for sore throat.   Eyes: Negative for blurred vision, double vision and pain.  Respiratory: Negative for cough, hemoptysis, shortness of breath and wheezing.   Cardiovascular: Negative for chest pain, palpitations, orthopnea and leg swelling.  Gastrointestinal: Negative for heartburn, nausea, vomiting, abdominal pain, diarrhea and constipation.  Genitourinary: Negative for dysuria and hematuria.  Musculoskeletal: Negative for back pain and joint pain.  Skin: Negative for rash.  Neurological: Negative for sensory change, speech change, focal weakness and headaches.  Endo/Heme/Allergies: Does not bruise/bleed easily.  Psychiatric/Behavioral: Negative for depression. The patient is not nervous/anxious.       DRUG ALLERGIES:   Allergies  Allergen Reactions  . Penicillins Hives    VITALS:  Blood pressure 137/90, pulse 74, temperature 99.8 F (37.7 C), temperature source Axillary, resp. rate 18, height 5\' 2"  (1.575 m), weight 62.37 kg (137 lb 8 oz), SpO2 98 %.  PHYSICAL EXAMINATION:   Physical Exam  GENERAL:  70 y.o.-year-old patient lying in the bed with no acute distress.  EYES: Pupils equal, round, reactive to light and accommodation. No scleral icterus. Extraocular muscles intact.  HEENT: Head atraumatic, normocephalic. Oropharynx and nasopharynx clear.  NECK:  Supple, no jugular venous distention. No thyroid enlargement, no tenderness.  LUNGS: Normal breath sounds bilaterally, no wheezing, rales, rhonchi. No use of accessory muscles of  respiration.  CARDIOVASCULAR: S1, S2 normal. No murmurs, rubs, or gallops.  ABDOMEN: Soft, nontender, nondistended. Bowel sounds present. No organomegaly or mass.  EXTREMITIES: No cyanosis, clubbing or edema b/l.    NEUROLOGIC: Cranial nerves II through XII are intact. No focal Motor or sensory deficits b/l.   PSYCHIATRIC: The patient is alert and oriented x 3.  SKIN: No obvious rash, lesion, or ulcer. Bruising   LABORATORY PANEL:   CBC  Recent Labs Lab 12/02/15 0809  WBC 5.5  HGB 9.1*  HCT 26.9*  PLT 74*   ------------------------------------------------------------------------------------------------------------------  Chemistries   Recent Labs Lab 12/02/15 0559 12/02/15 0809  NA 134*  --   K 3.4*  --   CL 100*  --   CO2 22  --   GLUCOSE 88  --   BUN 18  --   CREATININE 0.75  --   CALCIUM 8.7*  --   MG  --  0.9*  AST  --  41  ALT  --  12*  ALKPHOS  --  74  BILITOT  --  1.6*   ------------------------------------------------------------------------------------------------------------------  Cardiac Enzymes No results for input(s): TROPONINI in the last 168 hours. ------------------------------------------------------------------------------------------------------------------  RADIOLOGY:  Dg Chest 2 View  12/02/2015  CLINICAL DATA:  Fever. EXAM: CHEST  2 VIEW COMPARISON:  11/23/2015 . FINDINGS: Mild prominence of the upper mediastinum most likely secondary to AP lordotic positioning and great vessels. Hilar structures normal. Lungs are clear. Cardiomegaly with normal pulmonary vascularity. No pleural effusion or pneumothorax. IMPRESSION: Cardiomegaly. No evidence of overt congestive heart failure. No focal pulmonary infiltrate. Electronically Signed   By: Maisie Fus  Register   On: 12/02/2015 09:14   Ct Head Wo Contrast  12/01/2015  CLINICAL DATA:  Seizure.  History of alcohol abuse. EXAM: CT HEAD WITHOUT CONTRAST TECHNIQUE: Contiguous axial images were obtained from  the base of the skull through the vertex without intravenous contrast. COMPARISON:  None. FINDINGS: There is a crescentic hematoma about the high left posterior parietal calvarium which measures approximately 1.0 x 6.6 cm (image 24, series 2). No associated radiopaque foreign body or displaced calvarial fracture. Scattered periventricular hypodensities compatible with microvascular ischemic disease. 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 No CT evidence acute large territory infarct. No intraparenchymal or extra-axial mass or hemorrhage. Normal size and configuration of the ventricles and basilar cisterns. No midline shift. Limited visualization of the paranasal sinuses and mastoid air cells is normal. No air-fluid levels. IMPRESSION: 1. Crescentic hematoma about the left posterior parietal calvarium without associated radiopaque foreign body, displaced calvarial fracture or acute intracranial process. 2. Sequela of microvascular ischemic disease as above. Electronically Signed   By: Simonne ComeJohn  Watts M.D.   On: 12/01/2015 19:30     ASSESSMENT AND PLAN:   Heather Rodgers is a 70 y.o. female with a known history of chronic alcohol abuse, chronic tobacco abuse, hypertension, chronic thrombocytopenia with anemia comes to the emergency room after after she was found in the kitchen floor this morning by her family. Per daughter's patient lately has been having falls with unsteady gait at home.  1. Seizure disorder, new onset appears alcohol-induced given history of heavy alcohol consumption according to the patient and her daughters -CIWA protocol with IV Ativan -Neurology consultation - Discussed with Dr. Thad Rangereynolds of neurology -Seizure precautions -Patient advised alcohol cessation  2. Left posterior parietal hematoma status post fall  3. Hypertension continue her losartan  4. Chronic thrombocytopenia secondary to alcohol abuse with history of  chronic anemia Avoid antiplatelet agents No indication for platelet transfusion.  5. Chronic tobacco abuse  6. DVT prophylaxis  SCDs Thrombocytopenia  7. Hypomagnesemia Replace oral and IV. Todd scheduled oral magnesium oxide from tomorrow.   All the records are reviewed and case discussed with Care Management/Social Workerr. Management plans discussed with the patient, family and they are in agreement.  CODE STATUS: FULL  DVT Prophylaxis: SCDs  TOTAL TIME TAKING CARE OF THIS PATIENT: 35 minutes.   POSSIBLE D/C IN 2-3 DAYS, DEPENDING ON CLINICAL CONDITION.   Milagros LollSudini, Karlee Staff R M.D on 12/02/2015 at 11:59 AM  Between 7am to 6pm - Pager - [phone]  After 6pm go to [email or web address] - password [email or web address]  [facility] [place] [facility]  Office  [phone]  CC: Primary care physician; Carollee Leitzoss, Carrie M, NP    Note: This dictation was prepared with Dragon dictation along with smaller phrase technology. Any transcriptional errors that result from this process are unintentional.

## 2015-12-02 NOTE — Progress Notes (Signed)
Initial Nutrition Assessment      INTERVENTION:  Meals and snacks: Cater to pt preferences Medical Nutrition Supplement will add ensure TID for added nutrition   NUTRITION DIAGNOSIS:   Inadequate oral intake related to acute illness as evidenced by per patient/family report.    GOAL:   Patient will meet greater than or equal to 90% of their needs    MONITOR:    (Energy intake, )  REASON FOR ASSESSMENT:   Consult Poor PO  ASSESSMENT:      Pt admitted with seizures, fall Etoh use  Past Medical History  Diagnosis Date  . Arthritis   . Hypertension   . Kidney stones   . Anemia     Current Nutrition: sleeping this pm during rounds, full tray at bedside. Dtr ate bedside  Food/Nutrition-Related History: Dtr reports for the past 4 days not eating except bites, did drink 1 boost   Scheduled Medications:  . cefTRIAXone (ROCEPHIN)  IV  1 g Intravenous Q24H  . cholecalciferol  5,000 Units Oral Daily  . folic acid  1 mg Oral Daily  . folic acid  1 mg Intravenous Daily  . gabapentin  300 mg Oral QHS  . LORazepam  0-4 mg Intravenous 4 times per day  . LORazepam  0-4 mg Intravenous Q12H  . LORazepam  0-4 mg Oral 4 times per day  . LORazepam  0-4 mg Oral Q12H  . losartan  100 mg Oral Daily  . [START ON 12/03/2015] magnesium oxide  400 mg Oral Daily  . magnesium sulfate 1 - 4 g bolus IVPB  4 g Intravenous Once  . metoprolol tartrate  50 mg Oral BID  . multivitamin with minerals  1 tablet Oral Daily  . thiamine  100 mg Intravenous Daily  . thiamine  100 mg Oral Daily  . vitamin B-12  2,000 mcg Oral Daily    Continuous Medications:  . 0.9 % NaCl with KCl 40 mEq / L 100 mL/hr (12/02/15 1203)     Electrolyte/Renal Profile and Glucose Profile:   Recent Labs Lab 12/01/15 1911 12/02/15 0559 12/02/15 0809  NA 134* 134*  --   K 3.0* 3.4*  --   CL 98* 100*  --   CO2 21* 22  --   BUN 21* 18  --   CREATININE 0.95 0.75  --   CALCIUM 9.1 8.7*  --   MG  --   --   0.9*  GLUCOSE 111* 88  --    Protein Profile:  Recent Labs Lab 12/02/15 0809  ALBUMIN 3.2*    Gastrointestinal Profile: Last BM: 1/6   Nutrition-Focused Physical Exam Findings:  Unable to complete Nutrition-Focused physical exam at this time.     Weight Change: dtr reports 20 pound weight loss 2 months ago and has not gained wt back    13% wt loss in the last 2 months    Diet Order:  Diet regular Room service appropriate?: Yes; Fluid consistency:: Thin  Skin:   reviewed   Height:   Ht Readings from Last 1 Encounters:  12/01/15 5\' 2"  (1.575 m)    Weight:   Wt Readings from Last 1 Encounters:  12/02/15 137 lb 8 oz (62.37 kg)    Ideal Body Weight:     BMI:  Body mass index is 25.14 kg/(m^2).  Estimated Nutritional Needs:   Kcal:  BEE 1108 kcals (IF 1.0-1.2, AF 1.3) 1610-9604 kcals/d.   Protein:  (1.0-1.2 g/kg) 62-74 g/d  Fluid:  (  25-5530ml/kg) 1550-187760ml/d  EDUCATION NEEDS:   No education needs identified at this time  HIGH Care Level  Anaise Sterbenz B. Freida BusmanAllen, RD, LDN [phone] (pager) Weekend/On-Call pager [phone])

## 2015-12-02 NOTE — Consult Note (Signed)
Reason for Consult:Seizure Referring Physician: Sudini  CC: Seizure  HPI: Heather Rodgers is an 70 y.o. female with a history of alcohol abuse who was found on the floor by her family on yesterday.  Patient unable to provide any history concerning event therefore all of the history is being obtained from the chart.  It appears that a tonic-clonic seizure was witnessed.  Patient had no bowel or bladder incontinence but did experience tongue biting.  Patient on Neurontin at home.   Patient reports that she has been attempting to stop drinking and has not had a drink in a couple of days.    Past Medical History  Diagnosis Date  . Arthritis   . Hypertension   . Kidney stones   . Anemia     History reviewed. No pertinent past surgical history.  Family History  Problem Relation Age of Onset  . Heart disease Mother   . Hypertension Mother   . Arthritis Father   . Heart disease Father   . Hypertension Father     Social History:  reports that she has been smoking Cigarettes.  She started smoking about 20 years ago. She has been smoking about 1.00 pack per day. She has never used smokeless tobacco. She reports that she drinks alcohol. She reports that she does not use illicit drugs.  Allergies  Allergen Reactions  . Penicillins Hives    Medications:  I have reviewed the patient's current medications. Prior to Admission:  Prescriptions prior to admission  Medication Sig Dispense Refill Last Dose  . aspirin EC 81 MG tablet Take 1 tablet by mouth daily.   12/01/2015 at 0800  . Cholecalciferol (D 5000) 5000 UNITS TABS Take 1 tablet by mouth daily.   12/01/2015 at Unknown time  . folic acid (FOLVITE) 1 MG tablet Take 1 mg by mouth daily.     Marland Kitchen gabapentin (NEURONTIN) 300 MG capsule Take 1 capsule (300 mg total) by mouth at bedtime. 30 capsule 0 11/30/2015 at Unknown time  . losartan (COZAAR) 100 MG tablet Take 1 tablet (100 mg total) by mouth daily. 30 tablet 2 12/01/2015 at 1700  . meclizine  (ANTIVERT) 25 MG tablet Take 25 mg by mouth 3 (three) times daily as needed for dizziness.   12/01/2015 at Unknown time  . meloxicam (MOBIC) 15 MG tablet Take 15 mg by mouth daily.   12/01/2015 at Unknown time   Scheduled: . cefTRIAXone (ROCEPHIN)  IV  1 g Intravenous Q24H  . cholecalciferol  5,000 Units Oral Daily  . folic acid  1 mg Oral Daily  . folic acid  1 mg Intravenous Daily  . gabapentin  300 mg Oral QHS  . LORazepam  0-4 mg Intravenous 4 times per day  . LORazepam  0-4 mg Intravenous Q12H  . LORazepam  0-4 mg Oral 4 times per day  . LORazepam  0-4 mg Oral Q12H  . losartan  100 mg Oral Daily  . [START ON 12/03/2015] magnesium oxide  400 mg Oral Daily  . magnesium oxide  800 mg Oral Once  . magnesium sulfate 1 - 4 g bolus IVPB  4 g Intravenous Once  . metoprolol tartrate  50 mg Oral BID  . multivitamin with minerals  1 tablet Oral Daily  . thiamine  100 mg Intravenous Daily  . thiamine  100 mg Oral Daily  . vitamin B-12  2,000 mcg Oral Daily    ROS: History obtained from the patient  General ROS: negative for -  chills, fatigue, fever, night sweats, weight gain or weight loss Psychological ROS: negative for - behavioral disorder, hallucinations, memory difficulties, mood swings or suicidal ideation Ophthalmic ROS: negative for - blurry vision, double vision, eye pain or loss of vision ENT ROS: tongue pain Allergy and Immunology ROS: negative for - hives or itchy/watery eyes Hematological and Lymphatic ROS: negative for - bleeding problems, bruising or swollen lymph nodes Endocrine ROS: negative for - galactorrhea, hair pattern changes, polydipsia/polyuria or temperature intolerance Respiratory ROS: negative for - cough, hemoptysis, shortness of breath or wheezing Cardiovascular ROS: negative for - chest pain, dyspnea on exertion, edema or irregular heartbeat Gastrointestinal ROS: negative for - abdominal pain, diarrhea, hematemesis, nausea/vomiting or stool  incontinence Genito-Urinary ROS: negative for - dysuria, hematuria, incontinence or urinary frequency/urgency Musculoskeletal ROS: negative for - joint swelling or muscular weakness Neurological ROS: as noted in HPI Dermatological ROS: negative for rash and skin lesion changes  Physical Examination: Blood pressure 137/90, pulse 74, temperature 99.8 F (37.7 C), temperature source Axillary, resp. rate 18, height 5\' 2"  (1.575 m), weight 62.37 kg (137 lb 8 oz), SpO2 98 %.  Gen: NAD HEENT-  Occipital hematoma.  Normal external eye and conjunctiva.  Normal TM's bilaterally.  Normal auditory canals and external ears. Normal external nose, mucus membranes and septum.  Normal pharynx. Cardiovascular- S1, S2 normal, pulses palpable throughout   Lungs- chest clear, no wheezing, rales, normal symmetric air entry Abdomen- soft, non-tender; bowel sounds normal; no masses,  no organomegaly Extremities- no edema Lymph-no adenopathy palpable Musculoskeletal-no joint tenderness, deformity or swelling Skin-warm and dry, no hyperpigmentation, vitiligo, or suspicious lesions  Neurological Examination Mental Status: Alert, oriented, thought content appropriate.  Speech fluent without evidence of aphasia.  Able to follow 3 step commands without difficulty. Cranial Nerves: II: Discs flat bilaterally; Visual fields grossly normal, pupils equal, round, reactive to light and accommodation III,IV, VI: ptosis not present, extra-ocular motions intact bilaterally V,VII: smile symmetric, facial light touch sensation decreased on the left VIII: hearing normal bilaterally IX,X: gag reflex present XI: bilateral shoulder shrug XII: midline tongue extension Motor: Right : Upper extremity   5/5    Left:     Upper extremity   5/5 with pronator drift  Lower extremity   5/5     Lower extremity   5/5 Tone and bulk:normal tone throughout; no atrophy noted Sensory: Pinprick and light touch decreased in the LUE Deep Tendon  Reflexes: 2+ and symmetric with absent AJ's bilaterally Plantars: Right: downgoing   Left: downgoing Cerebellar: normal finger-to-nose and normal heel-to-shin testing bilaterally Gait: not tested due to safety concerns   Laboratory Studies:   Basic Metabolic Panel:  Recent Labs Lab 12/01/15 1911 12/02/15 0559 12/02/15 0809  NA 134* 134*  --   K 3.0* 3.4*  --   CL 98* 100*  --   CO2 21* 22  --   GLUCOSE 111* 88  --   BUN 21* 18  --   CREATININE 0.95 0.75  --   CALCIUM 9.1 8.7*  --   MG  --   --  0.9*    Liver Function Tests:  Recent Labs Lab 12/02/15 0809  AST 41  ALT 12*  ALKPHOS 74  BILITOT 1.6*  PROT 8.2*  ALBUMIN 3.2*   No results for input(s): LIPASE, AMYLASE in the last 168 hours. No results for input(s): AMMONIA in the last 168 hours.  CBC:  Recent Labs Lab 11/29/15 1548 12/01/15 1911 12/02/15 0809  WBC 3.0* 4.3 5.5  NEUTROABS 2.1  --   --   HGB 10.0* 9.3* 9.1*  HCT 29.9*  27.2* 27.5* 26.9*  MCV 104.8* 106.8* 107.6*  PLT 110* 78* 74*    Cardiac Enzymes:  Recent Labs Lab 12/01/15 1911  CKTOTAL 148    BNP: Invalid input(s): POCBNP  CBG:  Recent Labs Lab 12/01/15 1904  GLUCAP 111*    Microbiology: No results found for this or any previous visit.  Coagulation Studies: No results for input(s): LABPROT, INR in the last 72 hours.  Urinalysis:  Recent Labs Lab 12/02/15 1000  COLORURINE YELLOW*  LABSPEC 1.010  PHURINE 8.0  GLUCOSEU NEGATIVE  HGBUR 1+*  BILIRUBINUR NEGATIVE  KETONESUR NEGATIVE  PROTEINUR 100*  NITRITE NEGATIVE  LEUKOCYTESUR NEGATIVE    Lipid Panel:  No results found for: CHOL, TRIG, HDL, CHOLHDL, VLDL, LDLCALC  HgbA1C: No results found for: HGBA1C  Urine Drug Screen:  No results found for: LABOPIA, COCAINSCRNUR, LABBENZ, AMPHETMU, THCU, LABBARB  Alcohol Level: No results for input(s): ETH in the last 168 hours.   Imaging: Dg Chest 2 View  12/02/2015  CLINICAL DATA:  Fever. EXAM: CHEST  2 VIEW  COMPARISON:  11/23/2015 . FINDINGS: Mild prominence of the upper mediastinum most likely secondary to AP lordotic positioning and great vessels. Hilar structures normal. Lungs are clear. Cardiomegaly with normal pulmonary vascularity. No pleural effusion or pneumothorax. IMPRESSION: Cardiomegaly. No evidence of overt congestive heart failure. No focal pulmonary infiltrate. Electronically Signed   By: Maisie Fushomas  Register   On: 12/02/2015 09:14   Ct Head Wo Contrast  12/01/2015  CLINICAL DATA:  Seizure.  History of alcohol abuse. EXAM: CT HEAD WITHOUT CONTRAST TECHNIQUE: Contiguous axial images were obtained from the base of the skull through the vertex without intravenous contrast. COMPARISON:  None. FINDINGS: There is a crescentic hematoma about the high left posterior parietal calvarium which measures approximately 1.0 x 6.6 cm (image 24, series 2). No associated radiopaque foreign body or displaced calvarial fracture. Scattered periventricular hypodensities compatible with microvascular ischemic disease. 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 No CT evidence acute large territory infarct. No intraparenchymal or extra-axial mass or hemorrhage. Normal size and configuration of the ventricles and basilar cisterns. No midline shift. Limited visualization of the paranasal sinuses and mastoid air cells is normal. No air-fluid levels. IMPRESSION: 1. Crescentic hematoma about the left posterior parietal calvarium without associated radiopaque foreign body, displaced calvarial fracture or acute intracranial process. 2. Sequela of microvascular ischemic disease as above. Electronically Signed   By: Simonne ComeJohn  Watts M.D.   On: 12/01/2015 19:30     Assessment/Plan: 70 year old female presenting after what appears to have been a seizure.  Patient with a history of ETOH abuse.  Has abruptly attempted to stop.  ETOH level not checked on admission.  Can not rule out the  possibility of an ETOH withdrawal seizure.  Patient also with low magnesium.  Head CT personally reviewed and shows no acute intracranial abnormalities. Patient does have left sided weakness on neurological examination though and further work up recommended.     Recommendations: 1.  EEG 2.  MRI of the brain without contrast due to left sided weakness and numbness on neurological examination.   3.  Would not initiate anticonvulsant therapy at this time.  Would only initiate if above work up reveals abnormalities.  4.  Seizure precautions 5.  Agree with CIWA protocol 6.  Patient unable to drive, operate heavy machinery, perform activities  at heights and participate in water activities until release by outpatient physician.   Thana Farr, MD [facility] [phone] 12/02/2015, 11:58 AM

## 2015-12-02 NOTE — Progress Notes (Signed)
MD notified of pt elevated temperature. Orders received.

## 2015-12-02 NOTE — ED Notes (Signed)
RN called floor RN and was told by Floor RN Lowella Bandyikki that pt was not accepted to floor due to blood pressure and that medication would have to be given and blood pressure would have to be brought down. MD made aware. ED Charge made aware.

## 2015-12-03 DIAGNOSIS — R569 Unspecified convulsions: Secondary | ICD-10-CM | POA: Diagnosis not present

## 2015-12-03 DIAGNOSIS — D696 Thrombocytopenia, unspecified: Secondary | ICD-10-CM | POA: Diagnosis not present

## 2015-12-03 DIAGNOSIS — G40909 Epilepsy, unspecified, not intractable, without status epilepticus: Secondary | ICD-10-CM | POA: Diagnosis not present

## 2015-12-03 DIAGNOSIS — I10 Essential (primary) hypertension: Secondary | ICD-10-CM | POA: Diagnosis not present

## 2015-12-03 DIAGNOSIS — F1010 Alcohol abuse, uncomplicated: Secondary | ICD-10-CM | POA: Diagnosis not present

## 2015-12-03 LAB — BASIC METABOLIC PANEL
Anion gap: 7 (ref 5–15)
BUN: 19 mg/dL (ref 6–20)
CALCIUM: 8.3 mg/dL — AB (ref 8.9–10.3)
CO2: 22 mmol/L (ref 22–32)
CREATININE: 0.59 mg/dL (ref 0.44–1.00)
Chloride: 107 mmol/L (ref 101–111)
Glucose, Bld: 90 mg/dL (ref 65–99)
Potassium: 4.3 mmol/L (ref 3.5–5.1)
SODIUM: 136 mmol/L (ref 135–145)

## 2015-12-03 LAB — CBC WITH DIFFERENTIAL/PLATELET
BASOS ABS: 0 10*3/uL (ref 0–0.1)
EOS ABS: 0 10*3/uL (ref 0–0.7)
Eosinophils Relative: 0 %
HCT: 26 % — ABNORMAL LOW (ref 35.0–47.0)
HEMOGLOBIN: 8.8 g/dL — AB (ref 12.0–16.0)
Lymphocytes Relative: 12 %
Lymphs Abs: 0.5 10*3/uL — ABNORMAL LOW (ref 1.0–3.6)
MCH: 37.2 pg — ABNORMAL HIGH (ref 26.0–34.0)
MCHC: 33.8 g/dL (ref 32.0–36.0)
MCV: 110.1 fL — ABNORMAL HIGH (ref 80.0–100.0)
Monocytes Absolute: 0.3 10*3/uL (ref 0.2–0.9)
Neutro Abs: 3.4 10*3/uL (ref 1.4–6.5)
Platelets: 70 10*3/uL — ABNORMAL LOW (ref 150–440)
RBC: 2.36 MIL/uL — ABNORMAL LOW (ref 3.80–5.20)
RDW: 14.5 % (ref 11.5–14.5)
WBC: 4.2 10*3/uL (ref 3.6–11.0)

## 2015-12-03 LAB — MAGNESIUM: Magnesium: 1.8 mg/dL (ref 1.7–2.4)

## 2015-12-03 MED ORDER — LORAZEPAM 2 MG/ML IJ SOLN
2.0000 mg | INTRAMUSCULAR | Status: DC | PRN
  Administered 2015-12-03 – 2015-12-04 (×2): 2 mg via INTRAVENOUS
  Filled 2015-12-03: qty 1

## 2015-12-03 MED ORDER — NICOTINE 21 MG/24HR TD PT24
21.0000 mg | MEDICATED_PATCH | Freq: Every day | TRANSDERMAL | Status: DC
  Administered 2015-12-03 – 2015-12-05 (×3): 21 mg via TRANSDERMAL
  Filled 2015-12-03 (×3): qty 1

## 2015-12-03 MED ORDER — HALOPERIDOL LACTATE 5 MG/ML IJ SOLN
2.0000 mg | Freq: Four times a day (QID) | INTRAMUSCULAR | Status: DC | PRN
  Administered 2015-12-03: 2 mg via INTRAVENOUS
  Filled 2015-12-03: qty 1

## 2015-12-03 NOTE — Progress Notes (Signed)
Patient has been very agitated and aggressive so far this shift. Pt not only is going through ETOH withdrawal she states that she "wants a cigarette" and is trying to get out of bed. Daughter told me that patient usually smokes 3 packs/day.  Called Dr. Juliene PinaMody and she put an order in for Nicotine patch.

## 2015-12-03 NOTE — Progress Notes (Addendum)
[facility] [place] [facility]   PATIENT NAME: Ardeth PerfectBrenda Gengler    MR#:  [id number]  DATE OF BIRTH:  1946/01/22  SUBJECTIVE:   REVIEW OF SYSTEMS:    Review of Systems  Unable to perform ROS     DRUG ALLERGIES:   Allergies  Allergen Reactions  . Penicillins Hives    VITALS:  Blood pressure 187/77, pulse 67, temperature 98.4 F (36.9 C), temperature source Axillary, resp. rate 18, height 5\' 2"  (1.575 m), weight 62.37 kg (137 lb 8 oz), SpO2 99 %.  PHYSICAL EXAMINATION:   Physical Exam  Constitutional: She is well-developed, well-nourished, and in no distress. No distress.  HENT:  Head: Normocephalic.  Eyes: No scleral icterus.  Neck: Normal range of motion. Neck supple. No JVD present. No tracheal deviation present.  Cardiovascular: Normal rate, regular rhythm and normal heart sounds.  Exam reveals no gallop and no friction rub.   No murmur heard. Pulmonary/Chest: Effort normal and breath sounds normal. No respiratory distress. She has no wheezes. She has no rales. She exhibits no tenderness.  Abdominal: Soft. Bowel sounds are normal. She exhibits no distension and no mass. There is no tenderness. There is no rebound and no guarding.  Musculoskeletal: Normal range of motion. She exhibits no edema.  Neurological: She is alert.  Oriented to name only  Skin: Skin is warm. No rash noted. No erythema.  Psychiatric: Affect and judgment normal.      LABORATORY PANEL:   CBC  Recent Labs Lab 12/03/15 0408  WBC 4.2  HGB 8.8*  HCT 26.0*  PLT 70*   ------------------------------------------------------------------------------------------------------------------  Chemistries   Recent Labs Lab 12/02/15 0809 12/03/15 0408  NA  --  136  K  --  4.3  CL  --  107  CO2  --  22  GLUCOSE  --  90  BUN  --  19  CREATININE  --  0.59  CALCIUM  --  8.3*  MG 0.9* 1.8  AST 41  --   ALT 12*  --   ALKPHOS 74  --   BILITOT 1.6*  --     ------------------------------------------------------------------------------------------------------------------  Cardiac Enzymes No results for input(s): TROPONINI in the last 168 hours. ------------------------------------------------------------------------------------------------------------------  RADIOLOGY:  Dg Chest 2 View  12/02/2015  CLINICAL DATA:  Fever. EXAM: CHEST  2 VIEW COMPARISON:  11/23/2015 . FINDINGS: Mild prominence of the upper mediastinum most likely secondary to AP lordotic positioning and great vessels. Hilar structures normal. Lungs are clear. Cardiomegaly with normal pulmonary vascularity. No pleural effusion or pneumothorax. IMPRESSION: Cardiomegaly. No evidence of overt congestive heart failure. No focal pulmonary infiltrate. Electronically Signed   By: Maisie Fushomas  Register   On: 12/02/2015 09:14   Ct Head Wo Contrast  12/01/2015  CLINICAL DATA:  Seizure.  History of alcohol abuse. EXAM: CT HEAD WITHOUT CONTRAST TECHNIQUE: Contiguous axial images were obtained from the base of the skull through the vertex without intravenous contrast. COMPARISON:  None. FINDINGS: There is a crescentic hematoma about the high left posterior parietal calvarium which measures approximately 1.0 x 6.6 cm (image 24, series 2). No associated radiopaque foreign body or displaced calvarial fracture. Scattered periventricular hypodensities compatible with microvascular ischemic disease. 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 No CT evidence acute large territory infarct. No intraparenchymal or extra-axial mass or hemorrhage. Normal size and configuration of the ventricles and basilar cisterns. No midline shift. Limited visualization of the paranasal sinuses and mastoid  air cells is normal. No air-fluid levels. IMPRESSION: 1. Crescentic hematoma about the left posterior parietal calvarium without associated radiopaque foreign body, displaced  calvarial fracture or acute intracranial process. 2. Sequela of microvascular ischemic disease as above. Electronically Signed   By: Simonne Come M.D.   On: 12/01/2015 19:30   Mr Brain Wo Contrast  12/02/2015  CLINICAL DATA:  Seizure.  Found on floor. EXAM: MRI HEAD WITHOUT CONTRAST TECHNIQUE: Multiplanar, multiecho pulse sequences of the brain and surrounding structures were obtained without intravenous contrast. COMPARISON:  CT head 12/01/2015 FINDINGS: Left parietal scalp hematoma as noted on the CT. No intracranial hemorrhage. No subdural hematoma. Ventricle size is normal. Mild atrophy. No shift of the midline structures. Negative for acute infarct. Mild chronic microvascular ischemic change in the white matter and basal ganglia. Brainstem intact. Negative for mass or edema. Paranasal sinuses clear.  Pituitary not enlarged. IMPRESSION: Left parietal scalp hematoma related to trauma. No acute intracranial abnormality. Electronically Signed   By: Marlan Palau M.D.   On: 12/02/2015 15:45     ASSESSMENT AND PLAN:   Roizy Harold is a 70 y.o. female with a known history of chronic alcohol abuse, chronic tobacco abuse, hypertension, chronic thrombocytopenia with anemia comes to the emergency room after after she was found in the kitchen floor this morning by her family. Per daughter's patient lately has been having falls with unsteady gait at home.  1. Seizure disorder, new onset appears alcohol-induced given history of heavy alcohol consumption according to the patient and her daughters Neurology consultation has recommended MRI and EEG. MRI of the brain shows no acute CVA. EEG is pending. I would not start antiepileptics at this time.   2. Left posterior parietal hematoma status post fall  3. Hypertension continue losartan  4. 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  5. Chronic  tobacco abuse: Patient is on nicotine patch.  6. EtOH abuse: Patient is on CIWA protocol. Patient remains with some agitation.   7. Hypomagnesemia: Replace as needed. 8. Metabolic encephalopathy: This is due to EtOH abuse/withdrawal.   CODE STATUS: FULL  DVT Prophylaxis: SCDs  TOTAL TIME TAKING CARE OF THIS PATIENT: 25 minutes.   POSSIBLE D/C IN 2-3 DAYS, DEPENDING ON CLINICAL CONDITION.   Carlia Bomkamp M.D on 12/03/2015 at 12:46 PM  Between 7am to 6pm - Pager - [phone]  After 6pm go to [email or web address] - password [email or web address]  [facility]  Office  [phone]  CC: Primary care physician; Carollee Leitz, NP    Note: This dictation was prepared with Dragon dictation along with smaller phrase technology. Any transcriptional errors that result from this process are unintentional.

## 2015-12-03 NOTE — Evaluation (Signed)
Physical Therapy Evaluation Patient Details Name: Heather Rodgers MRN: [id number] DOB: Mar 26, 1946 Today's Date: 12/03/2015   History of Present Illness  Pt has been having a few months of weakness, and more recently has had falls and altered mental status.  She does abuse ETOH and this appears to be a factor.   Clinical Impression  Pt does poorly with PT exam and needs max assist with sitting, standing and "walking".  She is confused, agitated and has heavy posterior lean making her unsafe and needing a lot of assist with all acts. She does not listen consistently to instructions and needs heavy assist just to remain upright, pt unable to meaningfully walk.  Spoke with family about the need for STR now and that her safety living alone in the future needs to be discussed among the family.    Follow Up Recommendations SNF    Equipment Recommendations       Recommendations for Other Services       Precautions / Restrictions Precautions Precautions: Fall Restrictions Weight Bearing Restrictions: No      Mobility  Bed Mobility Overal bed mobility: Needs Assistance Bed Mobility: Supine to Sit;Sit to Supine     Supine to sit: Mod assist;Max assist Sit to supine: Max assist;Mod assist   General bed mobility comments: Pt leaning back heavily and needing considerable assist to remain upright, pt shows little awareness to shift weight forward despite much cuing  Transfers Overall transfer level: Needs assistance Equipment used: Rolling walker (2 wheeled) Transfers: Sit to/from Stand Sit to Stand: Max assist         General transfer comment: Pt leaning heavily backwards and unable to shift weight forward despite heavy assist and constant cuing.  Pt does not use walker appropriately and is unsafe with standing.   Ambulation/Gait Ambulation/Gait assistance: Max assist;Total assist Ambulation Distance (Feet): 2 Feet Assistive device: Rolling walker (2 wheeled)       General  Gait Details: Pt able to only minimall take a few side shuffle steps with excessive assist especially to keep weight forward.  She shows little ability to follow instructions and only marginally moves LEs with heel-toe shuffle  Stairs            Wheelchair Mobility    Modified Rankin (Stroke Patients Only)       Balance Overall balance assessment: Needs assistance   Sitting balance-Leahy Scale: Poor Sitting balance - Comments: Pt only able to maintain upright balance an brief rare occasions w/o consistent assist shifting forward Postural control: Posterior lean   Standing balance-Leahy Scale: Poor                               Pertinent Vitals/Pain Pain Assessment:  (does not rate, doesn't appear to have a lot of pain)    Home Living Family/patient expects to be discharged to:: Private residence Living Arrangements: Alone Available Help at Discharge: Family (daughters grocery shop, etc)           Home Equipment: Gilmer MorCane - single point      Prior Function           Comments: Apparently until a few weeks ago pt was able to get around fine without AD, recently she has been unsafe, falling a lot, etc     Hand Dominance        Extremity/Trunk Assessment   Upper Extremity Assessment:  (difficult to assess secondary to mental status, appears  weak)           Lower Extremity Assessment:  (difficult to assess secondary to mental status, appears weak)         Communication   Communication:  (pt impulsive and agitated)  Cognition Arousal/Alertness:  (pt is not fully aware of her situation at this time) Behavior During Therapy: Restless;Agitated;Impulsive Overall Cognitive Status: Impaired/Different from baseline                      General Comments      Exercises        Assessment/Plan    PT Assessment Patient needs continued PT services  PT Diagnosis Difficulty walking;Generalized weakness   PT Problem List Decreased  strength;Decreased activity tolerance;Decreased balance;Decreased mobility;Decreased coordination;Decreased cognition;Decreased safety awareness;Decreased knowledge of use of DME  PT Treatment Interventions Gait training;DME instruction;Stair training;Functional mobility training;Therapeutic activities;Therapeutic exercise;Neuromuscular re-education;Balance training;Cognitive remediation;Patient/family education   PT Goals (Current goals can be found in the Care Plan section) Acute Rehab PT Goals Patient Stated Goal: get stronger PT Goal Formulation: With patient/family Time For Goal Achievement: 12/17/15 Potential to Achieve Goals: Fair    Frequency Min 2X/week   Barriers to discharge        Co-evaluation               End of Session Equipment Utilized During Treatment: Gait belt Activity Tolerance: Treatment limited secondary to agitation;Patient limited by lethargy Patient left: in bed;with nursing/sitter in room           Time: 1244-1314 PT Time Calculation (min) (ACUTE ONLY): 30 min   Charges:   PT Evaluation $PT Eval Moderate Complexity: 1 Procedure     PT G Codes:       Loran Senters, PT, DPT [phone]  Malachi Pro 12/03/2015, 3:59 PM

## 2015-12-03 NOTE — Progress Notes (Signed)
Spoke to Dr. Juliene PinaMody regarding patient's lorazipam inj for ciwa. Dr. Juliene PinaMody added order for 2mg  lorazipam q2 PRN for agitation.

## 2015-12-03 NOTE — Progress Notes (Signed)
Patient continues to be agitated, trying to get out of bed & is combative. Called Dr. Juliene PinaMody who then ordered Haldol 2mg  q6 PRN for agitation

## 2015-12-04 DIAGNOSIS — F1010 Alcohol abuse, uncomplicated: Secondary | ICD-10-CM | POA: Diagnosis not present

## 2015-12-04 DIAGNOSIS — I10 Essential (primary) hypertension: Secondary | ICD-10-CM | POA: Diagnosis not present

## 2015-12-04 DIAGNOSIS — R569 Unspecified convulsions: Secondary | ICD-10-CM | POA: Diagnosis not present

## 2015-12-04 DIAGNOSIS — D696 Thrombocytopenia, unspecified: Secondary | ICD-10-CM | POA: Diagnosis not present

## 2015-12-04 DIAGNOSIS — G40909 Epilepsy, unspecified, not intractable, without status epilepticus: Secondary | ICD-10-CM | POA: Diagnosis not present

## 2015-12-04 LAB — BASIC METABOLIC PANEL
ANION GAP: 10 (ref 5–15)
BUN: 17 mg/dL (ref 6–20)
CALCIUM: 8.4 mg/dL — AB (ref 8.9–10.3)
CHLORIDE: 105 mmol/L (ref 101–111)
CO2: 20 mmol/L — AB (ref 22–32)
Creatinine, Ser: 0.54 mg/dL (ref 0.44–1.00)
GFR calc Af Amer: >60 mL/min (ref >60)
GFR calc non Af Amer: >60 mL/min (ref >60)
GLUCOSE: 89 mg/dL (ref 65–99)
POTASSIUM: 4.6 mmol/L (ref 3.5–5.1)
Sodium: 135 mmol/L (ref 135–145)

## 2015-12-04 LAB — URINE CULTURE

## 2015-12-04 LAB — CBC
HEMATOCRIT: 28.2 % — AB (ref 35.0–47.0)
HEMOGLOBIN: 9.5 g/dL — AB (ref 12.0–16.0)
MCH: 36.1 pg — AB (ref 26.0–34.0)
MCHC: 33.6 g/dL (ref 32.0–36.0)
MCV: 107.4 fL — AB (ref 80.0–100.0)
Platelets: 84 10*3/uL — ABNORMAL LOW (ref 150–440)
RBC: 2.63 MIL/uL — AB (ref 3.80–5.20)
RDW: 14.4 % (ref 11.5–14.5)
WBC: 5.2 10*3/uL (ref 3.6–11.0)

## 2015-12-04 LAB — MAGNESIUM: Magnesium: 1.3 mg/dL — ABNORMAL LOW (ref 1.7–2.4)

## 2015-12-04 MED ORDER — METOPROLOL TARTRATE 25 MG PO TABS
25.0000 mg | ORAL_TABLET | Freq: Two times a day (BID) | ORAL | Status: DC
  Administered 2015-12-04 – 2015-12-05 (×3): 25 mg via ORAL
  Filled 2015-12-04 (×3): qty 1

## 2015-12-04 MED ORDER — HYDRALAZINE HCL 25 MG PO TABS
25.0000 mg | ORAL_TABLET | Freq: Three times a day (TID) | ORAL | Status: DC
  Administered 2015-12-04 – 2015-12-05 (×5): 25 mg via ORAL
  Filled 2015-12-04 (×3): qty 1

## 2015-12-04 MED ORDER — ENSURE ENLIVE PO LIQD: 237.0000 mL | Freq: Three times a day (TID) | ORAL | Status: DC

## 2015-12-04 MED ORDER — ACETAMINOPHEN 325 MG PO TABS: 650.0000 mg | ORAL_TABLET | Freq: Four times a day (QID) | ORAL | Status: DC | PRN

## 2015-12-04 MED ORDER — HYDRALAZINE HCL 25 MG PO TABS: 25.0000 mg | ORAL_TABLET | Freq: Three times a day (TID) | ORAL | Status: DC

## 2015-12-04 MED ORDER — NICOTINE 21 MG/24HR TD PT24: 21.0000 mg | MEDICATED_PATCH | Freq: Every day | TRANSDERMAL | Status: DC

## 2015-12-04 NOTE — Progress Notes (Signed)
Room air. Takes meds whole with water. Daughters at the bedside. Sitter was d/c due to possible d/c to SNF 1/8. CIWA 5 and 1mg  of ativan given. Alert to self. Pt has no further concerns at this time.

## 2015-12-04 NOTE — Discharge Summary (Addendum)
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 Arthritis   . Hypertension   . Kidney stones   . Anemia     HOSPITAL COURSE:   Heather Rodgers is a 70 y.o. female with a known history of chronic alcohol abuse, chronic tobacco abuse, hypertension, chronic thrombocytopenia with anemia comes to the emergency room after after she was found in the kitchen floor this morning by her family. Per daughter's patient lately has been having falls with unsteady gait at home.  1. Seizure disorder, new onset appears alcohol-induced given history of heavy alcohol consumption according to the patient and her daughters Neurology consultation has recommended MRI. MRI of the brain shows no acute CVA.  I would not start antiepileptics at this time.  2. Left posterior parietal hematoma status post fall  3.Accelerated Hypertension continue losartan and added hydralazine for better BP control  4. 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  5. Chronic tobacco abuse: Patient is on nicotine patch.I counseled patient today on stopipng EtOh and tobacco. She does not seem interested but does have patch. She smokes 3 PPD. Counseled 3 minutes   6. EtOH abuse: Patient was on CIWA protocol.   7. Hypomagnesemia: Replace as needed. 8. Metabolic encephalopathy: This is due to EtOH abuse/withdrawal. She is more apopriate this am.. 9. C diff colitis: Patient is on flagyl and needs  10 days of treatment ending on 12/15/2015  DISCHARGE CONDITIONS AND DIET:  Stable condition Cardiac diet  CONSULTS OBTAINED:  Treatment Team:  Thana Farr, MD  DRUG ALLERGIES:   Allergies  Allergen Reactions  . Penicillins Hives    DISCHARGE MEDICATIONS:   Current Discharge Medication List    START taking these medications   Details  acetaminophen (TYLENOL) 325 MG tablet Take 2 tablets (650 mg total) by mouth every 6 (six) hours as needed for fever. Qty: 30 tablet, Refills: 0    feeding supplement, ENSURE ENLIVE, (ENSURE ENLIVE) LIQD Take 237 mLs by mouth 3 (three) times daily. Qty: 237 mL, Refills: 12    hydrALAZINE (APRESOLINE) 50 MG tablet Take 1 tablet (50 mg total) by mouth every 8 (eight) hours. Qty: 90 tablet, Refills: 0    metroNIDAZOLE (FLAGYL) 500 MG tablet Take 1 tablet (500 mg total) by mouth 3 (three) times daily. Qty: 30 tablet, Refills: 0    nicotine (NICODERM CQ - DOSED IN MG/24 HOURS) 21 mg/24hr patch Place 1 patch (21 mg total) onto the skin daily. Qty: 28 patch, Refills: 0    thiamine 100 MG tablet Take 1 tablet (100 mg total) by mouth daily. Qty: 30 tablet, Refills: 0      CONTINUE these medications which have NOT CHANGED   Details  aspirin EC 81 MG tablet Take 1 tablet by mouth daily.    Cholecalciferol (D 5000) 5000 UNITS TABS Take 1 tablet by mouth daily.    folic acid (FOLVITE) 1 MG tablet Take 1 mg by mouth daily.    gabapentin (NEURONTIN) 300 MG capsule Take  1 capsule (300 mg total) by mouth at bedtime. Qty: 30 capsule, Refills: 0    losartan (COZAAR) 100 MG tablet Take 1 tablet (100 mg total) by mouth daily. Qty: 30 tablet, Refills: 2    meclizine (ANTIVERT) 25 MG tablet Take 25 mg by mouth 3 (three) times daily as needed for dizziness.      STOP taking these medications     meloxicam (MOBIC) 15 MG tablet               Today   CHIEF COMPLAINT:  No issues overnight   VITAL SIGNS:  Blood pressure 172/80, pulse  79, temperature 96.2 F (35.7 C), temperature source Oral, resp. rate 20, height 5\' 2"  (1.575 m), weight 62.37 kg (137 lb 8 oz), SpO2 99 %.   REVIEW OF SYSTEMS:  Review of Systems  Constitutional: Negative for fever, chills and malaise/fatigue.  HENT: Negative for sore throat.   Eyes: Negative for blurred vision.  Respiratory: Negative for cough, hemoptysis, shortness of breath and wheezing.   Cardiovascular: Negative for chest pain, palpitations and leg swelling.  Gastrointestinal: Negative for nausea, vomiting, abdominal pain, diarrhea and blood in stool.  Genitourinary: Negative for dysuria.  Musculoskeletal: Negative for back pain.  Neurological: Negative for dizziness, tremors and headaches.  Endo/Heme/Allergies: Does not bruise/bleed easily.     PHYSICAL EXAMINATION:  GENERAL:  70 y.o.-year-old patient lying in the bed with no acute distress.  NECK:  Supple, no jugular venous distention. No thyroid enlargement, no tenderness.  LUNGS: Normal breath sounds bilaterally, no wheezing, rales,rhonchi  No use of accessory muscles of respiration.  CARDIOVASCULAR: S1, S2 normal. No murmurs, rubs, or gallops.  ABDOMEN: Soft, non-tender, non-distended. Bowel sounds present. No organomegaly or mass.  EXTREMITIES: No pedal edema, cyanosis, or clubbing.  PSYCHIATRIC: The patient is alert and oriented x 3.  SKIN: No obvious rash, lesion, or ulcer.   DATA REVIEW:   CBC  Recent Labs Lab 12/04/15 0331  WBC 5.2  HGB 9.5*  HCT 28.2*  PLT 84*    Chemistries   Recent Labs Lab 12/02/15 0809  12/04/15 0331 12/05/15 0341  NA  --   < > 135 136  K  --   < > 4.6 4.7  CL  --   < > 105 110  CO2  --   < > 20* 20*  GLUCOSE  --   < > 89 106*  BUN  --   < > 17 19  CREATININE  --   < > 0.54 0.57  CALCIUM  --   < > 8.4* 8.4*  MG 0.9*  < > 1.3*  --   AST 41  --   --   --   ALT 12*  --   --   --   ALKPHOS 74  --   --   --   BILITOT 1.6*  --   --   --   < > = values in this interval not  displayed.  Cardiac Enzymes No results for input(s): TROPONINI in the last 168 hours.  Microbiology Results  @[email or web address] @  RADIOLOGY:  No results found.    Management plans discussed with the patient and she is in agreement. Stable for discharge   Patient should follow up with PCP in 1 week  CODE STATUS:     Code Status Orders        Start     Ordered   12/02/15 0123  Full code   Continuous  12/02/15 0122      TOTAL TIME TAKING CARE OF THIS PATIENT: 35 minutes.    Note: This dictation was prepared with Dragon dictation along with smaller phrase technology. Any transcriptional errors that result from this process are unintentional.  Darlys Buis M.D on 12/05/2015 at 11:40 AM  Between 7am to 6pm - Pager - [phone] After 6pm go to [email or web address] - password [email or web address] [facility]  [facility]  Office  [phone]  CC: Primary care physician; Carollee Leitzoss, Carrie M, NP

## 2015-12-04 NOTE — Progress Notes (Signed)
[facility] Physicians - [place] at [facility]   PATIENT NAME: Heather Rodgers    MR#:  [id number]  DATE OF BIRTH:  [birth year]  SUBJECTIVE:  Patient more awake this am and not agitated Sitter at bedside She is oriented times three this am and is not showing signs of withdrawal.  REVIEW OF SYSTEMS:    Review of Systems  Constitutional: Negative for fever, chills and malaise/fatigue.  HENT: Negative for sore throat.   Eyes: Negative for blurred vision.  Respiratory: Negative for cough, hemoptysis, shortness of breath and wheezing.   Cardiovascular: Negative for chest pain, palpitations and leg swelling.  Gastrointestinal: Negative for nausea, vomiting, abdominal pain, diarrhea and blood in stool.  Genitourinary: Negative for dysuria.  Musculoskeletal: Negative for back pain.  Neurological: Negative for dizziness and headaches.  Endo/Heme/Allergies: Does not bruise/bleed easily.  Psychiatric/Behavioral: Positive for substance abuse. Negative for suicidal ideas.      DRUG ALLERGIES:   Allergies  Allergen Reactions  . Penicillins Hives    VITALS:  Blood pressure 179/107, pulse 62, temperature 98.3 F (36.8 C), temperature source Oral, resp. rate 18, height 5\' 2"  (1.575 m), weight 62.37 kg (137 lb 8 oz), SpO2 100 %.  PHYSICAL EXAMINATION:   Physical Exam  Constitutional: She is oriented to person, place, and time and well-developed, well-nourished, and in no distress. No distress.  HENT:  Head: Normocephalic.  Eyes: No scleral icterus.  Neck: Normal range of motion. Neck supple. No JVD present. No tracheal deviation present.  Cardiovascular: Normal rate, regular rhythm and normal heart sounds.  Exam reveals no gallop and no friction rub.   No murmur heard. Pulmonary/Chest: Effort normal and breath sounds normal. No respiratory distress. She has no wheezes. She has no rales. She exhibits no tenderness.  Abdominal: Soft. Bowel sounds are normal. She exhibits no  distension and no mass. There is no tenderness. There is no rebound and no guarding.  Musculoskeletal: Normal range of motion. She exhibits no edema.  Neurological: She is alert and oriented to person, place, and time.  Skin: Skin is warm. No rash noted. No erythema.  Psychiatric: Affect normal.      LABORATORY PANEL:   CBC  Recent Labs Lab 12/04/15 0331  WBC 5.2  HGB 9.5*  HCT 28.2*  PLT 84*   ------------------------------------------------------------------------------------------------------------------  Chemistries   Recent Labs Lab 12/02/15 0809  12/04/15 0331  NA  --   < > 135  K  --   < > 4.6  CL  --   < > 105  CO2  --   < > 20*  GLUCOSE  --   < > 89  BUN  --   < > 17  CREATININE  --   < > 0.54  CALCIUM  --   < > 8.4*  MG 0.9*  < > 1.3*  AST 41  --   --   ALT 12*  --   --   ALKPHOS 74  --   --   BILITOT 1.6*  --   --   < > = values in this interval not displayed. ------------------------------------------------------------------------------------------------------------------  Cardiac Enzymes No results for input(s): TROPONINI in the last 168 hours. ------------------------------------------------------------------------------------------------------------------  RADIOLOGY:  Dg Chest 2 View  12/02/2015  CLINICAL DATA:  Fever. EXAM: CHEST  2 VIEW COMPARISON:  11/23/2015 . FINDINGS: Mild prominence of the upper mediastinum most likely secondary to AP lordotic positioning and great vessels. Hilar structures normal. Lungs are clear. Cardiomegaly with normal pulmonary  vascularity. No pleural effusion or pneumothorax. IMPRESSION: Cardiomegaly. No evidence of overt congestive heart failure. No focal pulmonary infiltrate. Electronically Signed   By: Maisie Fushomas  Register   On: 12/02/2015 09:14   Mr Brain Wo Contrast  12/02/2015  CLINICAL DATA:  Seizure.  Found on floor. EXAM: MRI HEAD WITHOUT CONTRAST TECHNIQUE: Multiplanar, multiecho pulse sequences of the brain and  surrounding structures were obtained without intravenous contrast. COMPARISON:  CT head 12/01/2015 FINDINGS: Left parietal scalp hematoma as noted on the CT. No intracranial hemorrhage. No subdural hematoma. Ventricle size is normal. Mild atrophy. No shift of the midline structures. Negative for acute infarct. Mild chronic microvascular ischemic change in the white matter and basal ganglia. Brainstem intact. Negative for mass or edema. Paranasal sinuses clear.  Pituitary not enlarged. IMPRESSION: Left parietal scalp hematoma related to trauma. No acute intracranial abnormality. Electronically Signed   By: Marlan Palauharles  Clark M.D.   On: 12/02/2015 15:45     ASSESSMENT AND PLAN:   Heather Rodgers is a 70 y.o. female with a known history of chronic alcohol abuse, chronic tobacco abuse, hypertension, chronic thrombocytopenia with anemia comes to the emergency room after after she was found in the kitchen floor this morning by her family. Per daughter's patient lately has been having falls with unsteady gait at home.  1. Seizure disorder, new onset appears alcohol-induced given history of heavy alcohol consumption according to the patient and her daughters Neurology consultation has recommended MRI and EEG. MRI of the brain shows no acute CVA. EEG results arepending. I would not start antiepileptics at this time.  2. Left posterior parietal hematoma status post fall  3.Accelerated Hypertension continue losartan and add hydralazine for better BP control  4. 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  5. Chronic tobacco abuse: Patient is on nicotine patch.I counseled patient today on stopipng EtOh and tobacco. She does not seem interested but does have patch. She smokes 3 PPD. Counseled 3 minutes   6. EtOH abuse: Patient is on CIWA protocol.   7. Hypomagnesemia: Replace as needed. 8. Metabolic  encephalopathy: This is due to EtOH abuse/withdrawal. She is more apopriate this am. Consider d/c sitter later today if she remains stable.    CODE STATUS: FULL  DVT Prophylaxis: SCDs  TOTAL TIME TAKING CARE OF THIS PATIENT: 25 minutes.   POSSIBLE D/C tomorrow DEPENDING ON CLINICAL CONDITION to SNF   Torris House M.D on 12/04/2015 at 8:37 AM  Between 7am to 6pm - Pager - [phone]  After 6pm go to [email or web address] - password [email or web address]  [facility]  Office  [phone]  CC: Primary care physician; Carollee Leitzoss, Carrie M, NP    Note: This dictation was prepared with Dragon dictation along with smaller phrase technology. Any transcriptional errors that result from this process are unintentional.

## 2015-12-04 NOTE — [place] FL2 (Signed)
[place] MEDICAID FL2 LEVEL OF CARE SCREENING TOOL     IDENTIFICATION  Patient Name: Heather BlanksBrenda W Caffey Birthdate: 07-13-1946 Sex: female Admission Date (Current Location): 12/01/2015  [place]   Facility and Address:  [facility], [address], [place], [place] [address]      Provider Number: [id number]  Attending Physician Name and Address:  Adrian SaranSital Mody, MD  Relative Name and Phone Number:       Current Level of Care: Hospital Recommended Level of Care: Skilled Nursing Facility Prior Approval Number:    Date Approved/Denied:   PASRR Number:  [id number] [id number])  Discharge Plan: SNF    Current Diagnoses: Patient Active Problem List   Diagnosis Date Noted  . Seizures (HCC) 12/01/2015  . Anemia due to chronic blood loss 11/16/2015  . Cough 11/16/2015  . B12 deficiency anemia 11/16/2015  . Left arm numbness 11/16/2015  . Encounter to establish care 09/28/2015  . Dizziness 09/28/2015  . Arthritis 09/19/2015  . HLD (hyperlipidemia) 09/19/2015  . BP (high blood pressure) 09/19/2015  . Arthritis, degenerative 09/19/2015  . Abnormal presence of protein in urine 09/19/2015  . Psoriasis 09/19/2015  . Chronic obstructive pulmonary disease (HCC) 06/11/2014  . Breathlessness on exertion 06/11/2014  . Current tobacco use 02/24/2014  . AA (alcohol abuse) 04/16/2013    Orientation RESPIRATION BLADDER Height & Weight    Self, Time, Situation, Place  Normal Incontinent 5\' 2"  (157.5 cm) 137 lbs.  BEHAVIORAL SYMPTOMS/MOOD NEUROLOGICAL BOWEL NUTRITION STATUS   (none ) Convulsions/Seizures Incontinent Diet (Regular Diet )  AMBULATORY STATUS COMMUNICATION OF NEEDS Skin   Extensive Assist Verbally Normal                       Personal Care Assistance Level of Assistance  Bathing, Feeding, Dressing Bathing Assistance: Limited assistance Feeding assistance: Independent Dressing Assistance: Limited assistance      Functional Limitations Info  Sight, Hearing, Speech Sight Info: Adequate Hearing Info: Adequate Speech Info: Adequate    SPECIAL CARE FACTORS FREQUENCY  PT (By licensed PT)     PT Frequency:  (5)              Contractures      Additional Factors Info  Code Status, Allergies, Psychotropic Code Status Info:  (Full Code. ) Allergies Info:  (Penicillins)           Current Medications (12/04/2015):  This is the current hospital active medication list Current Facility-Administered Medications  Medication Dose Route Frequency Provider Last Rate Last Dose  . 0.9 % NaCl with KCl 40 mEq / L  infusion   Intravenous Continuous Enedina FinnerSona Patel, MD 100 mL/hr at 12/03/15 1440 100 mL/hr at 12/03/15 1440  . acetaminophen (TYLENOL) tablet 650 mg  650 mg Oral Q6H PRN Ihor AustinPavan Pyreddy, MD   650 mg at 12/02/15 0650  . cefTRIAXone (ROCEPHIN) 1 g in dextrose 5 % 50 mL IVPB  1 g Intravenous Q24H Milagros LollSrikar Sudini, MD   1 g at 12/03/15 1133  . cholecalciferol (VITAMIN D) tablet 5,000 Units  5,000 Units Oral Daily Enedina FinnerSona Patel, MD   5,000 Units at 12/04/15 [phone]  . feeding supplement (ENSURE ENLIVE) (ENSURE ENLIVE) liquid 237 mL  237 mL Oral TID Milagros LollSrikar Sudini, MD   237 mL at 12/04/15 0800  . folic acid (FOLVITE) tablet 1 mg  1 mg Oral Daily Enedina FinnerSona Patel, MD   1 mg at 12/04/15 0837  . folic acid injection 1 mg  1 mg Intravenous Daily Sharman Cheek, MD   1 mg at 12/02/15 0012  . gabapentin (NEURONTIN) capsule 300 mg  300 mg Oral QHS Enedina Finner, MD   300 mg at 12/03/15 2130  . haloperidol lactate (HALDOL) injection 2 mg  2 mg Intravenous Q6H PRN Adrian Saran, MD   2 mg at 12/03/15 1711  . hydrALAZINE (APRESOLINE) injection 10 mg  10 mg Intravenous Q6H PRN Enedina Finner, MD   10 mg at 12/03/15 1524  . hydrALAZINE (APRESOLINE) tablet 25 mg  25 mg Oral 3 times per day Adrian Saran, MD   25 mg at 12/04/15 0700  . LORazepam (ATIVAN) injection 2 mg  2 mg Intravenous Q2H PRN Adrian Saran, MD   2 mg at 12/04/15 0557  . LORazepam  (ATIVAN) tablet 0-4 mg  0-4 mg Oral Q12H Sharman Cheek, MD   1 mg at 12/04/15 0837  . losartan (COZAAR) tablet 100 mg  100 mg Oral Daily Enedina Finner, MD   100 mg at 12/04/15 0837  . meclizine (ANTIVERT) tablet 25 mg  25 mg Oral TID PRN Enedina Finner, MD      . metoprolol tartrate (LOPRESSOR) tablet 25 mg  25 mg Oral BID Adrian Saran, MD   25 mg at 12/04/15 0837  . morphine 2 MG/ML injection 1 mg  1 mg Intravenous Q4H PRN Enedina Finner, MD      . multivitamin with minerals tablet 1 tablet  1 tablet Oral Daily Enedina Finner, MD   1 tablet at 12/04/15 0837  . nicotine (NICODERM CQ - dosed in mg/24 hours) patch 21 mg  21 mg Transdermal Daily Adrian Saran, MD   21 mg at 12/04/15 0843  . ondansetron (ZOFRAN) tablet 4 mg  4 mg Oral Q6H PRN Enedina Finner, MD       Or  . ondansetron (ZOFRAN) injection 4 mg  4 mg Intravenous Q6H PRN Enedina Finner, MD      . oxyCODONE (Oxy IR/ROXICODONE) immediate release tablet 5 mg  5 mg Oral Q4H PRN Enedina Finner, MD      . thiamine (B-1) injection 100 mg  100 mg Intravenous Daily Sharman Cheek, MD   100 mg at 12/03/15 0751  . thiamine (VITAMIN B-1) tablet 100 mg  100 mg Oral Daily Sharman Cheek, MD   100 mg at 12/04/15 0837  . vitamin B-12 (CYANOCOBALAMIN) tablet 2,000 mcg  2,000 mcg Oral Daily Milagros Loll, MD   2,000 mcg at 12/04/15 1610     Discharge Medications: Please see discharge summary for a list of discharge medications.  Relevant Imaging Results:  Relevant Lab Results:   Additional Information  (SSN: [id number])  Haig Prophet, LCSW

## 2015-12-04 NOTE — Clinical Social Work Placement (Signed)
   CLINICAL SOCIAL WORK PLACEMENT  NOTE  Date:  12/04/2015  Patient Details  Name: Heather BlanksBrenda W Savoca MRN: [id number] Date of Birth: 1946-03-24  Clinical Social Work is seeking post-discharge placement for this patient at the Skilled  Nursing Facility level of care (*CSW will initial, date and re-position this form in  chart as items are completed):  Yes   Patient/family provided with [facility] Clinical Social Work Department's list of facilities offering this level of care within the geographic area requested by the patient (or if unable, by the patient's family).  Yes   Patient/family informed of their freedom to choose among providers that offer the needed level of care, that participate in Medicare, Medicaid or managed care program needed by the patient, have an available bed and are willing to accept the patient.  Yes   Patient/family informed of [facility]'s ownership interest in [facility] and [facility], as well as of the fact that they are under no obligation to receive care at these facilities.  PASRR submitted to EDS on 12/04/15     PASRR number received on 12/04/15     Existing PASRR number confirmed on       FL2 transmitted to all facilities in geographic area requested by pt/family on 12/04/15     FL2 transmitted to all facilities within larger geographic area on       Patient informed that his/her managed care company has contracts with or will negotiate with certain facilities, including the following:            Patient/family informed of bed offers received.  Patient chooses bed at       Physician recommends and patient chooses bed at      Patient to be transferred to   on  .  Patient to be transferred to facility by       Patient family notified on   of transfer.  Name of family member notified:        PHYSICIAN       Additional Comment:    _______________________________________________ Haig ProphetMorgan, Denita Lun G, LCSW 12/04/2015, 9:58 AM

## 2015-12-04 NOTE — Clinical Social Work Note (Signed)
Clinical Social Work Assessment  Patient Details  Name: Heather Rodgers MRN: [id number] Date of Birth: [birth year]  Date of referral:  12/04/15               Reason for consult:  Facility Placement, Substance Use/ETOH Abuse                Permission sought to share information with:  Chartered certified accountant granted to share information::  Yes, Verbal Permission Granted  Name::      Heather Rodgers::   [place]   Relationship::     Contact Information:     Housing/Transportation Living arrangements for the past 2 months:  Heather Rodgers of Information:  Patient Patient Interpreter Needed:  None Criminal Activity/Legal Involvement Pertinent to Current Situation/Hospitalization:  No - Comment as needed Significant Relationships:  Adult Children Lives with:  Self Do you feel safe going back to the place where you live?  Yes Need for family participation in patient care:  No (Coment)  Care giving concerns:  Patient lives alone in [place].    Social Worker assessment / plan:  Holiday representative (CSW) received substance abuse and SNF consult. Patient has a Actuary today. CSW discussed with RN the need for patient to be sitter free for 24 hours before discharging to SNF. RN contacted MD and sitter was D/C'ed today. CSW met with patient and sitter was at bedside. CSW introduced self and explained role of CSW department. Patient was alert and oriented to self and time. When asked where she was at patient stated she knew where she was but did not know the name of it. Patient was sitting up in bed and actively participated in assessment. Patient reported that she lives alone in [place] and has 3 daughters Heather Rodgers and Heather Rodgers. Per patient her daughters also live in [place]. Patient reported that she drinks about 4 mixed alcoholic drinks per day and has been doing this for 20 years. Patient reported that she quit drinking alcohol  last week. Per patient she does not go AA or counseling however her church provides support. CSW discussed outpatient substance abuse resources with patient such as [facility] and [employer]. CSW provided patient with paper copy of resources. Patient reported that she would consider following up with them and accepted resources. CSW also discussed that PT is recommending SNF. Patient is agreeable to SNF search and does not have a preference. Patient reported that she has never been to SNF before. SNF list provided. CSW explained to patient that her [employer] insurance will require authorization for SNF. Patient verbalized her understanding.   FL2 complete and faxed out.   Employment status:  Retired Nurse, adult PT Recommendations:  [place] / Referral to community resources:  [place], Outpatient Substance Abuse Treatment Options  Patient/Family's Response to care:  Patient accepted substance abuse resources and is agreeable to [facility] in [place].   Patient/Family's Understanding of and Emotional Response to Diagnosis, Current Treatment, and Prognosis:  Patient was pleasant throughout assessment and thanked CSW for visit.   Emotional Assessment Appearance:  Appears older than stated age Attitude/Demeanor/Rapport:    Affect (typically observed):  Accepting, Adaptable, Pleasant Orientation:  Oriented to Self, Oriented to Place, Oriented to  Time, Oriented to Situation Alcohol / Substance use:  Alcohol Use Psych involvement (Current and /or in the community):  No (Comment)  Discharge Needs  Concerns to be addressed:  Discharge Planning Concerns Readmission within the last 30 days:  No Current discharge risk:  Substance Abuse Barriers to Discharge:  Continued Medical Work up   Heather Freshwater, LCSW 12/04/2015, 10:00 AM

## 2015-12-04 NOTE — Progress Notes (Signed)
Patient's daughter Lamar LaundrySonya [phone] approached CSW in hallway. Per daughter patient's husband passed away from cirrhosis from heavy alcohol use and now her mother is going through the same thing. CSW provided emotional support. Daughter is agreeable to SNF search and prefers [facility]. CSW encouraged daughter to consider a second choice as [facility] may by full. Daughter verbalized her understanding and will go over the SNF list. CSW will continue to follow and assist as needed.   Jetta LoutBailey Morgan, LCSWA [phone]

## 2015-12-05 DIAGNOSIS — F1010 Alcohol abuse, uncomplicated: Secondary | ICD-10-CM | POA: Diagnosis not present

## 2015-12-05 DIAGNOSIS — R569 Unspecified convulsions: Secondary | ICD-10-CM | POA: Diagnosis not present

## 2015-12-05 DIAGNOSIS — E784 Other hyperlipidemia: Secondary | ICD-10-CM | POA: Diagnosis not present

## 2015-12-05 DIAGNOSIS — D649 Anemia, unspecified: Secondary | ICD-10-CM | POA: Diagnosis not present

## 2015-12-05 DIAGNOSIS — F10188 Alcohol abuse with other alcohol-induced disorder: Secondary | ICD-10-CM | POA: Diagnosis not present

## 2015-12-05 DIAGNOSIS — R278 Other lack of coordination: Secondary | ICD-10-CM | POA: Diagnosis not present

## 2015-12-05 DIAGNOSIS — M6281 Muscle weakness (generalized): Secondary | ICD-10-CM | POA: Diagnosis not present

## 2015-12-05 DIAGNOSIS — Z9181 History of falling: Secondary | ICD-10-CM | POA: Diagnosis not present

## 2015-12-05 DIAGNOSIS — F17200 Nicotine dependence, unspecified, uncomplicated: Secondary | ICD-10-CM | POA: Diagnosis not present

## 2015-12-05 DIAGNOSIS — A047 Enterocolitis due to Clostridium difficile: Secondary | ICD-10-CM | POA: Diagnosis not present

## 2015-12-05 DIAGNOSIS — I10 Essential (primary) hypertension: Secondary | ICD-10-CM | POA: Diagnosis not present

## 2015-12-05 DIAGNOSIS — R339 Retention of urine, unspecified: Secondary | ICD-10-CM | POA: Diagnosis not present

## 2015-12-05 DIAGNOSIS — F10230 Alcohol dependence with withdrawal, uncomplicated: Secondary | ICD-10-CM | POA: Diagnosis not present

## 2015-12-05 DIAGNOSIS — M1380 Other specified arthritis, unspecified site: Secondary | ICD-10-CM | POA: Diagnosis not present

## 2015-12-05 DIAGNOSIS — S0990XS Unspecified injury of head, sequela: Secondary | ICD-10-CM | POA: Diagnosis not present

## 2015-12-05 DIAGNOSIS — J449 Chronic obstructive pulmonary disease, unspecified: Secondary | ICD-10-CM | POA: Diagnosis not present

## 2015-12-05 DIAGNOSIS — D513 Other dietary vitamin B12 deficiency anemia: Secondary | ICD-10-CM | POA: Diagnosis not present

## 2015-12-05 DIAGNOSIS — G894 Chronic pain syndrome: Secondary | ICD-10-CM | POA: Diagnosis not present

## 2015-12-05 DIAGNOSIS — E569 Vitamin deficiency, unspecified: Secondary | ICD-10-CM | POA: Diagnosis not present

## 2015-12-05 DIAGNOSIS — G4089 Other seizures: Secondary | ICD-10-CM | POA: Diagnosis not present

## 2015-12-05 DIAGNOSIS — G40909 Epilepsy, unspecified, not intractable, without status epilepticus: Secondary | ICD-10-CM | POA: Diagnosis not present

## 2015-12-05 DIAGNOSIS — D696 Thrombocytopenia, unspecified: Secondary | ICD-10-CM | POA: Diagnosis not present

## 2015-12-05 DIAGNOSIS — R262 Difficulty in walking, not elsewhere classified: Secondary | ICD-10-CM | POA: Diagnosis not present

## 2015-12-05 LAB — BASIC METABOLIC PANEL
ANION GAP: 6 (ref 5–15)
BUN: 19 mg/dL (ref 6–20)
CALCIUM: 8.4 mg/dL — AB (ref 8.9–10.3)
CHLORIDE: 110 mmol/L (ref 101–111)
CO2: 20 mmol/L — ABNORMAL LOW (ref 22–32)
CREATININE: 0.57 mg/dL (ref 0.44–1.00)
GFR calc non Af Amer: >60 mL/min (ref >60)
Glucose, Bld: 106 mg/dL — ABNORMAL HIGH (ref 65–99)
Potassium: 4.7 mmol/L (ref 3.5–5.1)
SODIUM: 136 mmol/L (ref 135–145)

## 2015-12-05 LAB — C DIFFICILE QUICK SCREEN W PCR REFLEX
C DIFFICILE (CDIFF) TOXIN: POSITIVE — AB
C DIFFICLE (CDIFF) ANTIGEN: POSITIVE — AB
C Diff interpretation: POSITIVE

## 2015-12-05 MED ORDER — THIAMINE HCL 100 MG PO TABS: 100.0000 mg | ORAL_TABLET | Freq: Every day | ORAL | Status: DC

## 2015-12-05 MED ORDER — HYDRALAZINE HCL 50 MG PO TABS
50.0000 mg | ORAL_TABLET | Freq: Three times a day (TID) | ORAL | Status: DC
Start: 2015-12-05 — End: 2016-01-02

## 2015-12-05 MED ORDER — METRONIDAZOLE 500 MG PO TABS: 500.0000 mg | ORAL_TABLET | Freq: Three times a day (TID) | ORAL | Status: AC

## 2015-12-05 MED ORDER — METRONIDAZOLE 500 MG PO TABS
500.0000 mg | ORAL_TABLET | Freq: Three times a day (TID) | ORAL | Status: DC
  Administered 2015-12-05: 500 mg via ORAL
  Filled 2015-12-05: qty 1

## 2015-12-05 NOTE — Clinical Social Work Note (Signed)
[facility] prior to patient being able to be discharged. York SpanielMonica Alam Guterrez MSW,LCSW [phone]

## 2015-12-05 NOTE — Discharge Planning (Signed)
Pt IV to be removed.  DC POC, orders and papers complete.  Report called to [facility] Garvin Fila(Ashley Logan, LPN). RN assessment and VS revealed stability for DC to facility.  Family contacted about DC plan and EMS called to transport.  Awaiting EMS arrival.

## 2015-12-05 NOTE — Discharge Planning (Addendum)
EEG contacted to attempt to inform of awaiting results for discharge.  Found unable to do today d/t no staff here to do. Contacting Neuro to inform. Neuro called back and stated pt can still be discharged and have EEG as outpatient.

## 2015-12-05 NOTE — Care Management Important Message (Signed)
Important Message  Patient Details  Name: Heather Rodgers MRN: [id number] Date of Birth: 02/16/1946   Medicare Important Message Given:  Yes    Collie SiadAngela Elonzo Sopp, RN 12/05/2015, 9:15 AM

## 2015-12-05 NOTE — Clinical Social Work Note (Signed)
Auth for SNF received by Amy at [facility]: [id number].  York SpanielMonica Gay Moncivais MSW,LCSW [phone]

## 2015-12-05 NOTE — Progress Notes (Signed)
I have read and agree with the Nurse Visit note below. I would have liked to have at least looked at her before being sent home. She went to the hospital not long afterwards (days) and was there for new onset seizures. Will follow in chart.   Naomie DeanCarrie Merced Hanners 12/05/2015 [phone]

## 2015-12-05 NOTE — Clinical Social Work Note (Signed)
Patient's daughter, Lamar LaundrySonya, [facility]. [facility] aware and discharge information has been sent. Nurse to call report. Patient's daughter requests transport via EMS. York SpanielMonica Tyee Vandevoorde MSW,LCSW [phone]

## 2015-12-05 NOTE — Clinical Social Work Placement (Signed)
   CLINICAL SOCIAL WORK PLACEMENT  NOTE  Date:  12/05/2015  Patient Details  Name: Heather BlanksBrenda W Haskett MRN: [id number] Date of Birth: 1946-06-03  Clinical Social Work is seeking post-discharge placement for this patient at the Skilled  Nursing Facility level of care (*CSW will initial, date and re-position this form in  chart as items are completed):  Yes   Patient/family provided with [facility] Clinical Social Work Department's list of facilities offering this level of care within the geographic area requested by the patient (or if unable, by the patient's family).  Yes   Patient/family informed of their freedom to choose among providers that offer the needed level of care, that participate in Medicare, Medicaid or managed care program needed by the patient, have an available bed and are willing to accept the patient.  Yes   Patient/family informed of [facility]'s ownership interest in [facility] and [facility], [facility], as well as of the fact that they are under no obligation to receive care at these facilities.  PASRR submitted to EDS on 12/04/15     PASRR number received on 12/04/15     Existing PASRR number confirmed on       FL2 transmitted to all facilities in geographic area requested by pt/family on 12/04/15     FL2 transmitted to all facilities within larger geographic area on       Patient informed that his/her managed care company has contracts with or will negotiate with certain facilities, including the following:        Yes   Patient/family informed of bed offers received.  Patient chooses bed at  [facility](AHCC)     Physician recommends and patient chooses bed at  [facility](SNF)    Patient to be transferred to  [facility](AHCC) on 12/05/15.  Patient to be transferred to facility by  (EMS)     Patient family notified on 12/05/15 of transfer.  Name of family member notified:  [name]     PHYSICIAN       Additional Comment:    _______________________________________________ York SpanielMonica  Karmelo Bass, LCSW 12/05/2015, 12:18 PM

## 2015-12-06 ENCOUNTER — Telehealth: Payer: Self-pay

## 2015-12-06 NOTE — Telephone Encounter (Signed)
[facility]. Appointment for B12 injection cancelled. Facility and daughter to call when patient is discharged home for a follow up visit with primary care provider.

## 2015-12-07 ENCOUNTER — Ambulatory Visit: Payer: PPO

## 2015-12-08 LAB — CULTURE, BLOOD (ROUTINE X 2)
CULTURE: NO GROWTH
Culture: NO GROWTH

## 2015-12-09 DIAGNOSIS — I10 Essential (primary) hypertension: Secondary | ICD-10-CM | POA: Diagnosis not present

## 2015-12-09 DIAGNOSIS — R569 Unspecified convulsions: Secondary | ICD-10-CM | POA: Diagnosis not present

## 2015-12-09 DIAGNOSIS — F17200 Nicotine dependence, unspecified, uncomplicated: Secondary | ICD-10-CM | POA: Diagnosis not present

## 2015-12-09 DIAGNOSIS — F10230 Alcohol dependence with withdrawal, uncomplicated: Secondary | ICD-10-CM | POA: Diagnosis not present

## 2015-12-12 ENCOUNTER — Telehealth: Payer: Self-pay

## 2015-12-12 NOTE — Telephone Encounter (Signed)
I ordered a CT of chest w/ contrast- I don't understand why insurance would approve something I didn't order. I am CC'ing Melissa Tuck on this to see if there was something I need to do otherwise.  Rosanne SackKasey- Please call Shawna OrleansMelanie and tell her we are working on this and I ordered one with contrast  Thanks!

## 2015-12-12 NOTE — Telephone Encounter (Signed)
Forgot to CC [facility]

## 2015-12-12 NOTE — Telephone Encounter (Signed)
No worries.  Thanks!

## 2015-12-12 NOTE — Telephone Encounter (Signed)
Sorry Lyla Sonarrie, I entered [phone] instead of [phone]. I am getting this fixed now.

## 2015-12-12 NOTE — Telephone Encounter (Signed)
Melanie called from the imaging center and ststed that Ms. Heather Rodgers was coming for a CT Chest w contrast but her insurance approved the CT of the chest without contrast. Which one do you want her to have? If it is the CT of the Chest w/o contrast there needs to be a new order. Please advise/tvw

## 2015-12-13 ENCOUNTER — Ambulatory Visit: Admit: 2015-12-13 | Payer: PPO

## 2015-12-13 ENCOUNTER — Ambulatory Visit: Payer: PPO | Admitting: Neurology

## 2015-12-13 ENCOUNTER — Other Ambulatory Visit: Payer: Self-pay | Admitting: Internal Medicine

## 2015-12-13 DIAGNOSIS — R059 Cough, unspecified: Secondary | ICD-10-CM

## 2015-12-13 DIAGNOSIS — R05 Cough: Secondary | ICD-10-CM

## 2015-12-14 ENCOUNTER — Inpatient Hospital Stay: Payer: PPO

## 2015-12-25 DIAGNOSIS — Z87891 Personal history of nicotine dependence: Secondary | ICD-10-CM | POA: Diagnosis not present

## 2015-12-25 DIAGNOSIS — E569 Vitamin deficiency, unspecified: Secondary | ICD-10-CM | POA: Diagnosis not present

## 2015-12-25 DIAGNOSIS — S0990XS Unspecified injury of head, sequela: Secondary | ICD-10-CM | POA: Diagnosis not present

## 2015-12-25 DIAGNOSIS — G894 Chronic pain syndrome: Secondary | ICD-10-CM | POA: Diagnosis not present

## 2015-12-25 DIAGNOSIS — Z658 Other specified problems related to psychosocial circumstances: Secondary | ICD-10-CM | POA: Diagnosis not present

## 2015-12-25 DIAGNOSIS — I10 Essential (primary) hypertension: Secondary | ICD-10-CM | POA: Diagnosis not present

## 2015-12-25 DIAGNOSIS — M6281 Muscle weakness (generalized): Secondary | ICD-10-CM | POA: Diagnosis not present

## 2015-12-25 DIAGNOSIS — D513 Other dietary vitamin B12 deficiency anemia: Secondary | ICD-10-CM | POA: Diagnosis not present

## 2015-12-25 DIAGNOSIS — R339 Retention of urine, unspecified: Secondary | ICD-10-CM | POA: Diagnosis not present

## 2015-12-25 DIAGNOSIS — R2689 Other abnormalities of gait and mobility: Secondary | ICD-10-CM | POA: Diagnosis not present

## 2015-12-28 DIAGNOSIS — J449 Chronic obstructive pulmonary disease, unspecified: Secondary | ICD-10-CM | POA: Diagnosis not present

## 2015-12-28 DIAGNOSIS — G894 Chronic pain syndrome: Secondary | ICD-10-CM | POA: Diagnosis not present

## 2015-12-28 DIAGNOSIS — D513 Other dietary vitamin B12 deficiency anemia: Secondary | ICD-10-CM | POA: Diagnosis not present

## 2015-12-28 DIAGNOSIS — Z9181 History of falling: Secondary | ICD-10-CM | POA: Diagnosis not present

## 2015-12-28 DIAGNOSIS — F1096 Alcohol use, unspecified with alcohol-induced persisting amnestic disorder: Secondary | ICD-10-CM | POA: Diagnosis not present

## 2015-12-28 DIAGNOSIS — Z87891 Personal history of nicotine dependence: Secondary | ICD-10-CM | POA: Diagnosis not present

## 2015-12-28 DIAGNOSIS — I10 Essential (primary) hypertension: Secondary | ICD-10-CM | POA: Diagnosis not present

## 2015-12-28 DIAGNOSIS — M6281 Muscle weakness (generalized): Secondary | ICD-10-CM | POA: Diagnosis not present

## 2015-12-29 ENCOUNTER — Ambulatory Visit: Admission: RE | Admit: 2015-12-29 | Payer: PPO | Source: Ambulatory Visit

## 2016-01-02 ENCOUNTER — Encounter: Payer: Self-pay | Admitting: Nurse Practitioner

## 2016-01-02 ENCOUNTER — Ambulatory Visit (INDEPENDENT_AMBULATORY_CARE_PROVIDER_SITE_OTHER): Payer: PPO | Admitting: Nurse Practitioner

## 2016-01-02 VITALS — BP 134/78 | HR 77 | Resp 12 | Ht 62.0 in | Wt 141.0 lb

## 2016-01-02 DIAGNOSIS — F1096 Alcohol use, unspecified with alcohol-induced persisting amnestic disorder: Secondary | ICD-10-CM | POA: Diagnosis not present

## 2016-01-02 DIAGNOSIS — J449 Chronic obstructive pulmonary disease, unspecified: Secondary | ICD-10-CM | POA: Diagnosis not present

## 2016-01-02 DIAGNOSIS — Z09 Encounter for follow-up examination after completed treatment for conditions other than malignant neoplasm: Secondary | ICD-10-CM

## 2016-01-02 DIAGNOSIS — D513 Other dietary vitamin B12 deficiency anemia: Secondary | ICD-10-CM | POA: Diagnosis not present

## 2016-01-02 DIAGNOSIS — I10 Essential (primary) hypertension: Secondary | ICD-10-CM | POA: Diagnosis not present

## 2016-01-02 DIAGNOSIS — Z87891 Personal history of nicotine dependence: Secondary | ICD-10-CM | POA: Diagnosis not present

## 2016-01-02 DIAGNOSIS — G894 Chronic pain syndrome: Secondary | ICD-10-CM | POA: Diagnosis not present

## 2016-01-02 DIAGNOSIS — M6281 Muscle weakness (generalized): Secondary | ICD-10-CM | POA: Diagnosis not present

## 2016-01-02 DIAGNOSIS — Z9181 History of falling: Secondary | ICD-10-CM | POA: Diagnosis not present

## 2016-01-02 MED ORDER — NICOTINE 21 MG/24HR TD PT24: 21.0000 mg | MEDICATED_PATCH | Freq: Every day | TRANSDERMAL | Status: DC

## 2016-01-02 NOTE — Progress Notes (Signed)
Patient ID: Heather Rodgers, female    DOB: 1946-04-10  Age: 70 y.o. MRN: [id number]  CC: Hospitalization Follow-up   HPI Heather Rodgers presents for CC of Hospitalization FU.   1) patient was admitted on 12/01/2015 and discharged on 12/05/2015.  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  Patient was advised to stop the Mobic, drinking, and smoking  Imaging:  MRI brain without contrast impression: Left parietal scalp hematoma related to trauma, but no acute intracranial abnormality Chest x-ray impression: Cardiomegaly, no evidence of overt congestive heart failure, no focal pulmonary infiltrate CT without contrast head impression: Cresenteric hematoma about the left posterior parietal calvarium without associated radiopaque foreign body, displaced calvarial fracture or acute intracranial process, sequela of microvascular ischemic disease as above  Patient did have a positive C. difficile test later in rehabilitation. Patient reports she was treated with Flagyl and her diarrhea has resolved. Patient has not been drinking or smoking since leaving rehabilitation. Patient has PT coming twice a week to home.  Patient reports this is the best she has felt in 2 years  History Heather Rodgers has a past medical history of Arthritis; Hypertension; Kidney stones; and Anemia.   She has no past surgical history on file.   Her family history includes Arthritis in her father; Heart disease in her father and mother; Hypertension in her father and mother.She reports that she has been smoking Cigarettes.  She started smoking about 20 years ago. She has been smoking about 1.00 pack per day. She has never used smokeless tobacco. She reports that she drinks alcohol. She reports that she does not use illicit drugs.  Outpatient Prescriptions Prior to Visit  Medication Sig Dispense Refill  . acetaminophen (TYLENOL) 325 MG tablet Take 2  tablets (650 mg total) by mouth every 6 (six) hours as needed for fever. 30 tablet 0  . aspirin EC 81 MG tablet Take 1 tablet by mouth daily.    . Cholecalciferol (D 5000) 5000 UNITS TABS Take 1 tablet by mouth daily.    . feeding supplement, ENSURE ENLIVE, (ENSURE ENLIVE) LIQD Take 237 mLs by mouth 3 (three) times daily. 237 mL 12  . folic acid (FOLVITE) 1 MG tablet Take 1 mg by mouth daily.    Marland Kitchen gabapentin (NEURONTIN) 300 MG capsule Take 1 capsule (300 mg total) by mouth at bedtime. 30 capsule 0  . losartan (COZAAR) 100 MG tablet Take 1 tablet (100 mg total) by mouth daily. 30 tablet 2  . thiamine 100 MG tablet Take 1 tablet (100 mg total) by mouth daily. 30 tablet 0  . hydrALAZINE (APRESOLINE) 50 MG tablet Take 1 tablet (50 mg total) by mouth every 8 (eight) hours. 90 tablet 0  . meclizine (ANTIVERT) 25 MG tablet Take 25 mg by mouth 3 (three) times daily as needed for dizziness.    . nicotine (NICODERM CQ - DOSED IN MG/24 HOURS) 21 mg/24hr patch Place 1 patch (21 mg total) onto the skin daily. 28 patch 0   No facility-administered medications prior to visit.    ROS Review of Systems  Constitutional: Negative for fever, chills, diaphoresis and fatigue.  Respiratory: Negative for chest tightness, shortness of breath and wheezing.   Cardiovascular: Negative for chest pain, palpitations and leg swelling.  Gastrointestinal: Negative for nausea, vomiting and diarrhea.  Skin: Negative for rash.  Neurological: Negative for dizziness, weakness, numbness and headaches.  Psychiatric/Behavioral: Negative for confusion and agitation. The patient is not nervous/anxious.  Objective:  BP 134/78 mmHg  Pulse 77  Resp 12  Ht  (1.575 m)  Wt 141 lb (63.957 kg)  BMI 25.78 kg/m2  SpO2 97%  Physical Exam  Constitutional: She is oriented to person, place, and time. She appears well-developed and well-nourished. No distress.  HENT:  Head: Normocephalic and atraumatic.  Right Ear: External ear  normal.  Left Ear: External ear normal.  Mouth/Throat: Oropharynx is clear and moist.  Eyes: Right eye exhibits no discharge. Left eye exhibits no discharge. No scleral icterus.  Neck: Normal range of motion. Neck supple.  Cardiovascular: Normal rate, regular rhythm and normal heart sounds.  Exam reveals no gallop and no friction rub.   No murmur heard. Pulmonary/Chest: Effort normal and breath sounds normal. No respiratory distress. She has no wheezes. She has no rales. She exhibits no tenderness.  Abdominal: Soft. Bowel sounds are normal. She exhibits no distension and no mass. There is no tenderness. There is no rebound and no guarding.  Lymphadenopathy:    She has no cervical adenopathy.  Neurological: She is alert and oriented to person, place, and time. No cranial nerve deficit. She exhibits normal muscle tone. Coordination normal.  Skin: Skin is warm and dry. No rash noted. She is not diaphoretic.  Psychiatric: She has a normal mood and affect. Her behavior is normal. Judgment and thought content normal.  Affect is greatly improved      Assessment & Plan:   There are no diagnoses linked to this encounter. I have discontinued Ms. Bradby's meclizine and hydrALAZINE. I am also having her maintain her aspirin EC, Cholecalciferol, losartan, gabapentin, folic acid, feeding supplement (ENSURE ENLIVE), acetaminophen, thiamine, and nicotine.  Meds ordered this encounter  Medications  . nicotine (NICODERM CQ - DOSED IN MG/24 HOURS) 21 mg/24hr patch    Sig: Place 1 patch (21 mg total) onto the skin daily.    Dispense:  28 patch    Refill:  0    Order Specific Question:  Supervising Provider    Answer:  Sherlene Shams [[id number]]     Follow-up: No Follow-up on file.

## 2016-01-07 ENCOUNTER — Encounter: Payer: Self-pay | Admitting: Nurse Practitioner

## 2016-01-07 DIAGNOSIS — Z09 Encounter for follow-up examination after completed treatment for conditions other than malignant neoplasm: Secondary | ICD-10-CM | POA: Insufficient documentation

## 2016-01-07 NOTE — Assessment & Plan Note (Addendum)
Patient reports she feels great currently She is following all the recommendations from the hospital She has stopped smoking with help with nicotine patches She stopped alcohol intake due to detox at rehabilitation Diarrhea has resolved Patient has continued thiamine 100 mg tablets We'll follow as needed  I have personally reviewed the discharge paperwork from the hospital and images.

## 2016-01-12 ENCOUNTER — Ambulatory Visit: Payer: PPO | Admitting: Nurse Practitioner

## 2016-01-30 ENCOUNTER — Encounter: Payer: Self-pay | Admitting: Neurology

## 2016-01-30 ENCOUNTER — Ambulatory Visit (INDEPENDENT_AMBULATORY_CARE_PROVIDER_SITE_OTHER): Payer: PPO | Admitting: Neurology

## 2016-01-30 VITALS — BP 130/74 | HR 79 | Ht 62.0 in | Wt 142.0 lb

## 2016-01-30 DIAGNOSIS — M5010 Cervical disc disorder with radiculopathy, unspecified cervical region: Secondary | ICD-10-CM | POA: Diagnosis not present

## 2016-01-30 NOTE — Progress Notes (Addendum)
NEUROLOGY CONSULTATION NOTE  Heather Rodgers MRN: [id number] DOB: 1946-01-16  Referring provider: Naomie Dean, NP Primary care provider: Naomie Dean, NP  Reason for consult:  Left arm and hand weakness  HISTORY OF PRESENT ILLNESS: Heather Rodgers is a 70 year old right-handed female with chronic thrombocytopenia with anemia and B12 deficiency and recently stopped tobacco and chronic alcohol use who presents for left arm and hand weakness and numbness.  MRI of brain from January reviewed.  For about 2 months, she reports weak grip in her left hand.  She reports a shooting pain from her left shoulder down to the first 3 digits of her left hand.  There is also some associated tingling and discomfort as well.  She denies neck pain.  She reports that the discomfort constant but more noticeable when she wakes up during the night in bed.  She denies any neck or shoulder injury.  Recent MRI of brain from 12/01/15 was unremarkable.  PAST MEDICAL HISTORY: Past Medical History  Diagnosis Date  . Arthritis   . Hypertension   . Kidney stones   . Anemia     PAST SURGICAL HISTORY: Past Surgical History  Procedure Laterality Date  . No past surgeries      MEDICATIONS: Current Outpatient Prescriptions on File Prior to Visit  Medication Sig Dispense Refill  . aspirin EC 81 MG tablet Take 1 tablet by mouth daily.    . Cholecalciferol (D 5000) 5000 UNITS TABS Take 1 tablet by mouth daily.    . folic acid (FOLVITE) 1 MG tablet Take 1 mg by mouth daily.    Marland Kitchen gabapentin (NEURONTIN) 300 MG capsule Take 1 capsule (300 mg total) by mouth at bedtime. 30 capsule 0  . losartan (COZAAR) 100 MG tablet Take 1 tablet (100 mg total) by mouth daily. 30 tablet 2  . thiamine 100 MG tablet Take 1 tablet (100 mg total) by mouth daily. 30 tablet 0   No current facility-administered medications on file prior to visit.    ALLERGIES: Allergies  Allergen Reactions  . Penicillins Hives    FAMILY  HISTORY: Family History  Problem Relation Age of Onset  . Heart disease Mother   . Hypertension Mother   . Arthritis Father   . Heart disease Father   . Hypertension Father     SOCIAL HISTORY: Social History   Social History  . Marital Status: Widowed    Spouse Name: N/A  . Number of Children: N/A  . Years of Education: N/A   Occupational History  . Not on file.   Social History Main Topics  . Smoking status: Former Smoker -- 1.00 packs/day    Types: Cigarettes    Start date: 09/19/1995    Quit date: 12/01/2015  . Smokeless tobacco: Never Used     Comment: Has patches  . Alcohol Use: No     Comment: none since late December 2016  . Drug Use: No  . Sexual Activity: No   Other Topics Concern  . Not on file   Social History Narrative   Widower   Employed as a Nurse, learning disability   Caffeine- 1 cup of coffee daily and no soda or tea       REVIEW OF SYSTEMS: Constitutional: No fevers, chills, or sweats, no generalized fatigue, change in appetite Eyes: No visual changes, double vision, eye pain Ear, nose and throat: No hearing loss, ear pain, nasal congestion, sore throat Cardiovascular: No chest pain, palpitations Respiratory:  No shortness of breath at rest or with exertion, wheezes GastrointestinaI: No nausea, vomiting, diarrhea, abdominal pain, fecal incontinence Genitourinary:  No dysuria, urinary retention or frequency Musculoskeletal:  No neck pain, back pain Integumentary: No rash, pruritus, skin lesions Neurological: as above Psychiatric: No depression, insomnia, anxiety Endocrine: No palpitations, fatigue, diaphoresis, mood swings, change in appetite, change in weight, increased thirst Hematologic/Lymphatic:  No anemia, purpura, petechiae. Allergic/Immunologic: no itchy/runny eyes, nasal congestion, recent allergic reactions, rashes  PHYSICAL EXAM: Filed Vitals:   01/30/16 1406  BP: 130/74  Pulse: 79   General: No acute distress.  Head:   Normocephalic/atraumatic Eyes:  fundi unremarkable, without vessel changes, exudates, hemorrhages or papilledema. Neck: supple, no paraspinal tenderness, full range of motion Back: No paraspinal tenderness Heart: regular rate and rhythm Lungs: Clear to auscultation bilaterally. Vascular: No carotid bruits. Neurological Exam: Mental status: alert and oriented to person, place, and time, recent and remote memory intact, fund of knowledge intact, attention and concentration intact, speech fluent and not dysarthric, language intact. Cranial nerves: CN I: not tested CN II: pupils equal, round and reactive to light, visual fields intact, fundi unremarkable, without vessel changes, exudates, hemorrhages or papilledema. CN III, IV, VI:  full range of motion, no nystagmus, no ptosis CN V: facial sensation intact CN VII: upper and lower face symmetric CN VIII: hearing intact CN IX, X: gag intact, uvula midline CN XI: sternocleidomastoid and trapezius muscles intact CN XII: tongue midline Bulk & Tone: normal, no fasciculations. Motor:  5-/5 left tricep and hand grip.  Otherwise 5/5 throughout  Sensation:  Hyperesthesia of first 3 digits of left hand. Vibration sensation intact. Deep Tendon Reflexes:  2+ throughout, toes downgoing.  Finger to nose testing:  Without dysmetria.  Heel to shin:  Without dysmetria.  Gait:  Normal station and stride.  Able to turn and tandem walk. Romberg negative.  IMPRESSION: Possible left cervical radiculopathy vs carpal tunnel syndrome.  PLAN: 1.  NCV-EMG of left upper extremity 2.  Consider MRI of cervical spine 3.  Follow up  Thank you for allowing me to take part in the care of this patient.  Shon MilletAdam Timothee Gali, DO  CC: Naomie Deanarrie Doss, NP

## 2016-01-30 NOTE — Patient Instructions (Signed)
1.  We will set you up for a nerve test of the left upper extremity.  Depending on results, may need to get MRI of the cervical spine (neck). 2.  In meantime, try [facility] and wear it at night to see if the symptoms are improved.

## 2016-01-31 ENCOUNTER — Ambulatory Visit (INDEPENDENT_AMBULATORY_CARE_PROVIDER_SITE_OTHER): Payer: PPO | Admitting: Neurology

## 2016-01-31 DIAGNOSIS — G5602 Carpal tunnel syndrome, left upper limb: Secondary | ICD-10-CM

## 2016-01-31 DIAGNOSIS — M5010 Cervical disc disorder with radiculopathy, unspecified cervical region: Secondary | ICD-10-CM

## 2016-01-31 NOTE — Procedures (Addendum)
[facility]  [address], [address]  [place], [place] [address] Tel: [phone] Fax:  [phone] Test Date:  01/31/2016  Patient: Heather Rodgers DOB: [birth year] Physician: Nita Sickleonika Dalene Robards, DO  Sex: Female Height: 5\' 2"  Ref Phys: Shon MilletJaffe, Adam  ID#: [id number] Temp: 33.6C Technician: Heather Rodgers   Patient Complaints: This is a 70 year old female referred for evaluation of left hand paresthesias, over the first 3 digits.  NCV & EMG Findings: Extensive electrodiagnostic testing of the left upper extremity shows: 1. Left median sensory response is absent. Left ulnar sensory response is within normal limits. 2. Left median motor response shows prolonged latency (6.0 ms) and reduced amplitude (3.2 mV).  The left ulnar motor nerve showed decreased conduction velocity (A Elbow-B Elbow, 45 m/s).   3. 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  Impression: 1. Left median neuropathy at or distal to the wrist, consistent with the clinical diagnosis of carpal tunnel syndrome. Overall, these findings are severe in degree electrically. 2. 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   ___________________________ Nita Sickleonika Jabir Dahlem, DO    Nerve Conduction Studies Anti Sensory Summary Table   Site NR Peak (ms) Norm Peak (ms) P-T Amp (V) Norm P-T Amp  Left Median Anti Sensory (2nd Digit)  Wrist NR  <3.8  >10  Left Ulnar Anti Sensory (5th Digit)  Wrist    3.1 <3.2 9.4 >5   Motor Summary Table   Site NR Onset (ms) Norm Onset (ms) O-P Amp (mV) Norm O-P Amp Site1 Site2 Delta-0 (ms) Dist (cm) Vel (m/s) Norm Vel (m/s)  Left Median Motor (Abd Poll Brev)  Wrist    6.0 <4.0 3.2 >5 Elbow Wrist 3.8 21.0 55 >50  Elbow    9.8  3.0         Left Ulnar Motor (Abd Dig Minimi)  Wrist    2.7 <3.1 7.3 >7 B Elbow Wrist 3.1 20.0 65 >50  B Elbow    5.8  6.6  A Elbow B Elbow 2.2  10.0 45 >50  A Elbow    8.0  5.7          EMG   Side Muscle Ins Act Fibs Psw Fasc Number Recrt Dur Dur. Amp Amp. Poly Poly. Comment  Left 1stDorInt Nml Nml Nml Nml Nml Nml Nml Nml Nml Nml Nml Nml N/A  Left Abd Poll Brev Nml Nml Nml Nml 1- Rapid Some 1+ Some 1+ Nml Nml N/A  Left Ext Indicis Nml Nml Nml Nml Nml Nml Nml Nml Nml Nml Nml Nml N/A  Left PronatorTeres Nml Nml Nml Nml Nml Nml Nml Nml Nml Nml Nml Nml N/A  Left Biceps Nml Nml Nml Nml Nml Nml Nml Nml Nml Nml Nml Nml N/A  Left Triceps Nml Nml Nml Nml Nml Nml Nml Nml Nml Nml Nml Nml N/A  Left Deltoid Nml Nml Nml Nml Nml Nml Nml Nml Nml Nml Nml Nml N/A  Left ABD Dig Min Nml Nml Nml Nml Nml Nml Nml Nml Nml Nml Nml Nml N/A  Left FlexDigProf 4,5 Nml Nml Nml Nml Nml Nml Nml Nml Nml Nml Nml Nml N/A      Waveforms:

## 2016-02-01 ENCOUNTER — Telehealth: Payer: Self-pay

## 2016-02-01 ENCOUNTER — Other Ambulatory Visit: Payer: Self-pay | Admitting: Nurse Practitioner

## 2016-02-01 DIAGNOSIS — G5602 Carpal tunnel syndrome, left upper limb: Secondary | ICD-10-CM

## 2016-02-01 NOTE — Telephone Encounter (Signed)
-----   Message from Drema DallasAdam R Jaffe, DO sent at 02/01/2016  7:48 AM EST ----- EMG revealed carpal tunnel syndrome, which correlates with symptoms of hand weakness and pain.  I advise referral to hand surgeon.  The findings are severe and since she reports some weakness in hand, it should be treated surgically.

## 2016-02-01 NOTE — Telephone Encounter (Signed)
Referral placed for hand surgeon. Thank you for the results of the EMG. Thank you all for your help! -Lyla SonCarrie

## 2016-02-01 NOTE — Telephone Encounter (Signed)
Message relayed to patient. Verbalized understanding. Requested that PCP receive results. Would like her to refer to hand surgeon. Sent results to PCP. Will forward this as well.

## 2016-02-03 ENCOUNTER — Encounter: Payer: Self-pay | Admitting: Nurse Practitioner

## 2016-02-07 ENCOUNTER — Telehealth: Payer: Self-pay

## 2016-02-07 DIAGNOSIS — R29898 Other symptoms and signs involving the musculoskeletal system: Secondary | ICD-10-CM

## 2016-02-07 NOTE — Telephone Encounter (Signed)
Voicemail is full. Will try to call again later.

## 2016-02-07 NOTE — Telephone Encounter (Signed)
Attempted to reach again. VM full.

## 2016-02-07 NOTE — Telephone Encounter (Signed)
-----   Message from Drema DallasAdam R Jaffe, DO sent at 02/06/2016  7:30 AM EDT ----- We referred patient to hand surgeon for carpal tunnel.  However, I would like to check MRI cervical spine w/o to rule of radiculopathy/left upper extremity weakness.

## 2016-02-07 NOTE — Telephone Encounter (Signed)
Order placed. Prior certification initated. Will call pt after 8 am and make her aware. [facility] [phone] [address] [place]

## 2016-02-08 NOTE — Telephone Encounter (Signed)
Attempted to reach again. No answer, no vm setup.

## 2016-02-08 NOTE — Telephone Encounter (Signed)
Attempted to reach. No vm set up

## 2016-02-09 NOTE — Telephone Encounter (Signed)
Attempted to reach pt. No answer.

## 2016-02-28 DIAGNOSIS — G5602 Carpal tunnel syndrome, left upper limb: Secondary | ICD-10-CM | POA: Diagnosis not present

## 2016-03-14 DIAGNOSIS — G5602 Carpal tunnel syndrome, left upper limb: Secondary | ICD-10-CM | POA: Diagnosis not present

## 2016-03-16 ENCOUNTER — Other Ambulatory Visit: Payer: Self-pay | Admitting: Nurse Practitioner

## 2016-03-27 DIAGNOSIS — Z4789 Encounter for other orthopedic aftercare: Secondary | ICD-10-CM | POA: Diagnosis not present

## 2016-06-08 ENCOUNTER — Telehealth: Payer: Self-pay | Admitting: *Deleted

## 2016-06-08 MED ORDER — FOLIC ACID 1 MG PO TABS: 1.0000 mg | ORAL_TABLET | Freq: Every day | ORAL | Status: DC

## 2016-06-08 NOTE — Telephone Encounter (Signed)
Patient requested a Rx refill for folic acid  [name]

## 2016-06-08 NOTE — Telephone Encounter (Signed)
rx sent, thanks.

## 2016-08-16 ENCOUNTER — Telehealth: Payer: Self-pay | Admitting: Nurse Practitioner

## 2016-08-16 MED ORDER — GABAPENTIN 300 MG PO CAPS: 300.0000 mg | ORAL_CAPSULE | Freq: Every day | ORAL | 0 refills | Status: DC

## 2016-08-16 NOTE — Telephone Encounter (Signed)
Pt requesting a refill of her gabapentin 300 mg. She says that she does not have any more refills.

## 2016-08-16 NOTE — Telephone Encounter (Signed)
Sent. thanks

## 2016-08-18 ENCOUNTER — Ambulatory Visit: Payer: PPO

## 2016-08-21 ENCOUNTER — Ambulatory Visit: Payer: PPO

## 2016-08-22 ENCOUNTER — Ambulatory Visit (INDEPENDENT_AMBULATORY_CARE_PROVIDER_SITE_OTHER): Payer: PPO

## 2016-08-22 DIAGNOSIS — Z23 Encounter for immunization: Secondary | ICD-10-CM

## 2016-08-24 ENCOUNTER — Encounter: Payer: Self-pay | Admitting: Family

## 2016-08-24 ENCOUNTER — Ambulatory Visit (INDEPENDENT_AMBULATORY_CARE_PROVIDER_SITE_OTHER): Payer: PPO | Admitting: Family

## 2016-08-24 VITALS — BP 132/66 | HR 71 | Temp 97.5°F | Ht 62.25 in | Wt 149.8 lb

## 2016-08-24 DIAGNOSIS — H40033 Anatomical narrow angle, bilateral: Secondary | ICD-10-CM | POA: Diagnosis not present

## 2016-08-24 DIAGNOSIS — H18413 Arcus senilis, bilateral: Secondary | ICD-10-CM | POA: Diagnosis not present

## 2016-08-24 DIAGNOSIS — M545 Low back pain, unspecified: Secondary | ICD-10-CM | POA: Insufficient documentation

## 2016-08-24 DIAGNOSIS — H02833 Dermatochalasis of right eye, unspecified eyelid: Secondary | ICD-10-CM | POA: Diagnosis not present

## 2016-08-24 DIAGNOSIS — H2513 Age-related nuclear cataract, bilateral: Secondary | ICD-10-CM | POA: Diagnosis not present

## 2016-08-24 NOTE — Patient Instructions (Signed)
Let's treat conservatively as we discussed.   Over-the-counter medications you may try for arthritic pain include:   ThermaCare patches   Capsaicin cream   Icy hot  Home exercises as we discussed below/handout.  If conservative treatment doesn't yield results,  consult to Sports Medicine/Orthopedics for further evaluation, and imaging.   If there is no improvement in your symptoms, or if there is any worsening of symptoms, or if you have any additional concerns, please return for re-evaluation; or, if we are closed, consider going to the Emergency Room for evaluation if symptoms urgent.    Low Back Sprain With Rehab A sprain is an injury in which a ligament is torn. The ligaments of the lower back are vulnerable to sprains. However, they are strong and require great force to be injured. These ligaments are important for stabilizing the spinal column. Sprains are classified into three categories. Grade 1 sprains cause pain, but the tendon is not lengthened. Grade 2 sprains include a lengthened ligament, due to the ligament being stretched or partially ruptured. With grade 2 sprains there is still function, although the function may be decreased. Grade 3 sprains involve a complete tear of the tendon or muscle, and function is usually impaired. SYMPTOMS   Severe pain in the lower back.  Sometimes, a feeling of a "pop," "snap," or tear, at the time of injury.  Tenderness and sometimes swelling at the injury site.  Uncommonly, bruising (contusion) within 48 hours of injury.  Muscle spasms in the back. CAUSES  Low back sprains occur when a force is placed on the ligaments that is greater than they can handle. Common causes of injury include:  Performing a stressful act while off-balance.  Repetitive stressful activities that involve movement of the lower back.  Direct hit (trauma) to the lower back. RISK INCREASES WITH:  Contact sports (football, wrestling).  Collisions (major  skiing accidents).  Sports that require throwing or lifting (baseball, weightlifting).  Sports involving twisting of the spine (gymnastics, diving, tennis, golf).  Poor strength and flexibility.  Inadequate protection.  Previous back injury or surgery (especially fusion). PREVENTION  Wear properly fitted and padded protective equipment.  Warm up and stretch properly before activity.  Allow for adequate recovery between workouts.  Maintain physical fitness:  Strength, flexibility, and endurance.  Cardiovascular fitness.  Maintain a healthy body weight. PROGNOSIS  If treated properly, low back sprains usually heal with non-surgical treatment. The length of time for healing depends on the severity of the injury.  RELATED COMPLICATIONS   Recurring symptoms, resulting in a chronic problem.  Chronic inflammation and pain in the low back.  Delayed healing or resolution of symptoms, especially if activity is resumed too soon.  Prolonged impairment.  Unstable or arthritic joints of the low back. TREATMENT  Treatment first involves the use of ice and medicine, to reduce pain and inflammation. The use of strengthening and stretching exercises may help reduce pain with activity. These exercises may be performed at home or with a therapist. Severe injuries may require referral to a therapist for further evaluation and treatment, such as ultrasound. Your caregiver may advise that you wear a back brace or corset, to help reduce pain and discomfort. Often, prolonged bed rest results in greater harm then benefit. Corticosteroid injections may be recommended. However, these should be reserved for the most serious cases. It is important to avoid using your back when lifting objects. At night, sleep on your back on a firm mattress, with a pillow placed  under your knees. If non-surgical treatment is unsuccessful, surgery may be needed.  MEDICATION   If pain medicine is needed, nonsteroidal  anti-inflammatory medicines (aspirin and ibuprofen), or other minor pain relievers (acetaminophen), are often advised.  Do not take pain medicine for 7 days before surgery.  Prescription pain relievers may be given, if your caregiver thinks they are needed. Use only as directed and only as much as you need.  Ointments applied to the skin may be helpful.  Corticosteroid injections may be given by your caregiver. These injections should be reserved for the most serious cases, because they may only be given a certain number of times. HEAT AND COLD  Cold treatment (icing) should be applied for 10 to 15 minutes every 2 to 3 hours for inflammation and pain, and immediately after activity that aggravates your symptoms. Use ice packs or an ice massage.  Heat treatment may be used before performing stretching and strengthening activities prescribed by your caregiver, physical therapist, or athletic trainer. Use a heat pack or a warm water soak. SEEK MEDICAL CARE IF:   Symptoms get worse or do not improve in 2 to 4 weeks, despite treatment.  You develop numbness or weakness in either leg.  You lose bowel or bladder function.  Any of the following occur after surgery: fever, increased pain, swelling, redness, drainage of fluids, or bleeding in the affected area.  New, unexplained symptoms develop. (Drugs used in treatment may produce side effects.) EXERCISES  RANGE OF MOTION (ROM) AND STRETCHING EXERCISES - Low Back Sprain Most people with lower back pain will find that their symptoms get worse with excessive bending forward (flexion) or arching at the lower back (extension). The exercises that will help resolve your symptoms will focus on the opposite motion.  Your physician, physical therapist or athletic trainer will help you determine which exercises will be most helpful to resolve your lower back pain. Do not complete any exercises without first consulting with your caregiver. Discontinue any  exercises which make your symptoms worse, until you speak to your caregiver. If you have pain, numbness or tingling which travels down into your buttocks, leg or foot, the goal of the therapy is for these symptoms to move closer to your back and eventually resolve. Sometimes, these leg symptoms will get better, but your lower back pain may worsen. This is often an indication of progress in your rehabilitation. Be very alert to any changes in your symptoms and the activities in which you participated in the 24 hours prior to the change. Sharing this information with your caregiver will allow him or her to most efficiently treat your condition. These exercises may help you when beginning to rehabilitate your injury. Your symptoms may resolve with or without further involvement from your physician, physical therapist or athletic trainer. While completing these exercises, remember:   Restoring tissue flexibility helps normal motion to return to the joints. This allows healthier, less painful movement and activity.  An effective stretch should be held for at least 30 seconds.  A stretch should never be painful. You should only feel a gentle lengthening or release in the stretched tissue. FLEXION RANGE OF MOTION AND STRETCHING EXERCISES: STRETCH - Flexion, Single Knee to Chest   Lie on a firm bed or floor with both legs extended in front of you.  Keeping one leg in contact with the floor, bring your opposite knee to your chest. Hold your leg in place by either grabbing behind your thigh or at your  knee.  Pull until you feel a gentle stretch in your low back. Hold __________ seconds.  Slowly release your grasp and repeat the exercise with the opposite side. Repeat __________ times. Complete this exercise __________ times per day.  STRETCH - Flexion, Double Knee to Chest  Lie on a firm bed or floor with both legs extended in front of you.  Keeping one leg in contact with the floor, bring your  opposite knee to your chest.  Tense your stomach muscles to support your back and then lift your other knee to your chest. Hold your legs in place by either grabbing behind your thighs or at your knees.  Pull both knees toward your chest until you feel a gentle stretch in your low back. Hold __________ seconds.  Tense your stomach muscles and slowly return one leg at a time to the floor. Repeat __________ times. Complete this exercise __________ times per day.  STRETCH - Low Trunk Rotation  Lie on a firm bed or floor. Keeping your legs in front of you, bend your knees so they are both pointed toward the ceiling and your feet are flat on the floor.  Extend your arms out to the side. This will stabilize your upper body by keeping your shoulders in contact with the floor.  Gently and slowly drop both knees together to one side until you feel a gentle stretch in your low back. Hold for __________ seconds.  Tense your stomach muscles to support your lower back as you bring your knees back to the starting position. Repeat the exercise to the other side. Repeat __________ times. Complete this exercise __________ times per day  EXTENSION RANGE OF MOTION AND FLEXIBILITY EXERCISES: STRETCH - Extension, Prone on Elbows   Lie on your stomach on the floor, a bed will be too soft. Place your palms about shoulder width apart and at the height of your head.  Place your elbows under your shoulders. If this is too painful, stack pillows under your chest.  Allow your body to relax so that your hips drop lower and make contact more completely with the floor.  Hold this position for __________ seconds.  Slowly return to lying flat on the floor. Repeat __________ times. Complete this exercise __________ times per day.  RANGE OF MOTION - Extension, Prone Press Ups  Lie on your stomach on the floor, a bed will be too soft. Place your palms about shoulder width apart and at the height of your  head.  Keeping your back as relaxed as possible, slowly straighten your elbows while keeping your hips on the floor. You may adjust the placement of your hands to maximize your comfort. As you gain motion, your hands will come more underneath your shoulders.  Hold this position __________ seconds.  Slowly return to lying flat on the floor. Repeat __________ times. Complete this exercise __________ times per day.  RANGE OF MOTION- Quadruped, Neutral Spine   Assume a hands and knees position on a firm surface. Keep your hands under your shoulders and your knees under your hips. You may place padding under your knees for comfort.  Drop your head and point your tailbone toward the ground below you. This will round out your lower back like an angry cat. Hold this position for __________ seconds.  Slowly lift your head and release your tail bone so that your back sags into a large arch, like an old horse.  Hold this position for __________ seconds.  Repeat this until  you feel limber in your low back.  Now, find your "sweet spot." This will be the most comfortable position somewhere between the two previous positions. This is your neutral spine. Once you have found this position, tense your stomach muscles to support your low back.  Hold this position for __________ seconds. Repeat __________ times. Complete this exercise __________ times per day.  STRENGTHENING EXERCISES - Low Back Sprain These exercises may help you when beginning to rehabilitate your injury. These exercises should be done near your "sweet spot." This is the neutral, low-back arch, somewhere between fully rounded and fully arched, that is your least painful position. When performed in this safe range of motion, these exercises can be used for people who have either a flexion or extension based injury. These exercises may resolve your symptoms with or without further involvement from your physician, physical therapist or athletic  trainer. While completing these exercises, remember:   Muscles can gain both the endurance and the strength needed for everyday activities through controlled exercises.  Complete these exercises as instructed by your physician, physical therapist or athletic trainer. Increase the resistance and repetitions only as guided.  You may experience muscle soreness or fatigue, but the pain or discomfort you are trying to eliminate should never worsen during these exercises. If this pain does worsen, stop and make certain you are following the directions exactly. If the pain is still present after adjustments, discontinue the exercise until you can discuss the trouble with your caregiver. STRENGTHENING - Deep Abdominals, Pelvic Tilt   Lie on a firm bed or floor. Keeping your legs in front of you, bend your knees so they are both pointed toward the ceiling and your feet are flat on the floor.  Tense your lower abdominal muscles to press your low back into the floor. This motion will rotate your pelvis so that your tail bone is scooping upwards rather than pointing at your feet or into the floor. With a gentle tension and even breathing, hold this position for __________ seconds. Repeat __________ times. Complete this exercise __________ times per day.  STRENGTHENING - Abdominals, Crunches   Lie on a firm bed or floor. Keeping your legs in front of you, bend your knees so they are both pointed toward the ceiling and your feet are flat on the floor. Cross your arms over your chest.  Slightly tip your chin down without bending your neck.  Tense your abdominals and slowly lift your trunk high enough to just clear your shoulder blades. Lifting higher can put excessive stress on the lower back and does not further strengthen your abdominal muscles.  Control your return to the starting position. Repeat __________ times. Complete this exercise __________ times per day.  STRENGTHENING - Quadruped, Opposite  UE/LE Lift   Assume a hands and knees position on a firm surface. Keep your hands under your shoulders and your knees under your hips. You may place padding under your knees for comfort.  Find your neutral spine and gently tense your abdominal muscles so that you can maintain this position. Your shoulders and hips should form a rectangle that is parallel with the floor and is not twisted.  Keeping your trunk steady, lift your right hand no higher than your shoulder and then your left leg no higher than your hip. Make sure you are not holding your breath. Hold this position for __________ seconds.  Continuing to keep your abdominal muscles tense and your back steady, slowly return to your starting  position. Repeat with the opposite arm and leg. Repeat __________ times. Complete this exercise __________ times per day.  STRENGTHENING - Abdominals and Quadriceps, Straight Leg Raise   Lie on a firm bed or floor with both legs extended in front of you.  Keeping one leg in contact with the floor, bend the other knee so that your foot can rest flat on the floor.  Find your neutral spine, and tense your abdominal muscles to maintain your spinal position throughout the exercise.  Slowly lift your straight leg off the floor about 6 inches for a count of 15, making sure to not hold your breath.  Still keeping your neutral spine, slowly lower your leg all the way to the floor. Repeat this exercise with each leg __________ times. Complete this exercise __________ times per day. POSTURE AND BODY MECHANICS CONSIDERATIONS - Low Back Sprain Keeping correct posture when sitting, standing or completing your activities will reduce the stress put on different body tissues, allowing injured tissues a chance to heal and limiting painful experiences. The following are general guidelines for improved posture. Your physician or physical therapist will provide you with any instructions specific to your needs. While  reading these guidelines, remember:  The exercises prescribed by your provider will help you have the flexibility and strength to maintain correct postures.  The correct posture provides the best environment for your joints to work. All of your joints have less wear and tear when properly supported by a spine with good posture. This means you will experience a healthier, less painful body.  Correct posture must be practiced with all of your activities, especially prolonged sitting and standing. Correct posture is as important when doing repetitive low-stress activities (typing) as it is when doing a single heavy-load activity (lifting). RESTING POSITIONS Consider which positions are most painful for you when choosing a resting position. If you have pain with flexion-based activities (sitting, bending, stooping, squatting), choose a position that allows you to rest in a less flexed posture. You would want to avoid curling into a fetal position on your side. If your pain worsens with extension-based activities (prolonged standing, working overhead), avoid resting in an extended position such as sleeping on your stomach. Most people will find more comfort when they rest with their spine in a more neutral position, neither too rounded nor too arched. Lying on a non-sagging bed on your side with a pillow between your knees, or on your back with a pillow under your knees will often provide some relief. Keep in mind, being in any one position for a prolonged period of time, no matter how correct your posture, can still lead to stiffness. PROPER SITTING POSTURE In order to minimize stress and discomfort on your spine, you must sit with correct posture. Sitting with good posture should be effortless for a healthy body. Returning to good posture is a gradual process. Many people can work toward this most comfortably by using various supports until they have the flexibility and strength to maintain this posture on  their own. When sitting with proper posture, your ears will fall over your shoulders and your shoulders will fall over your hips. You should use the back of the chair to support your upper back. Your lower back will be in a neutral position, just slightly arched. You may place a small pillow or folded towel at the base of your lower back for  support.  When working at a desk, create an environment that supports good, upright posture.  Without extra support, muscles tire, which leads to excessive strain on joints and other tissues. Keep these recommendations in mind: CHAIR:  A chair should be able to slide under your desk when your back makes contact with the back of the chair. This allows you to work closely.  The chair's height should allow your eyes to be level with the upper part of your monitor and your hands to be slightly lower than your elbows. BODY POSITION  Your feet should make contact with the floor. If this is not possible, use a foot rest.  Keep your ears over your shoulders. This will reduce stress on your neck and low back. INCORRECT SITTING POSTURES  If you are feeling tired and unable to assume a healthy sitting posture, do not slouch or slump. This puts excessive strain on your back tissues, causing more damage and pain. Healthier options include:  Using more support, like a lumbar pillow.  Switching tasks to something that requires you to be upright or walking.  Talking a brief walk.  Lying down to rest in a neutral-spine position. PROLONGED STANDING WHILE SLIGHTLY LEANING FORWARD  When completing a task that requires you to lean forward while standing in one place for a long time, place either foot up on a stationary 2-4 inch high object to help maintain the best posture. When both feet are on the ground, the lower back tends to lose its slight inward curve. If this curve flattens (or becomes too large), then the back and your other joints will experience too much stress,  tire more quickly, and can cause pain. CORRECT STANDING POSTURES Proper standing posture should be assumed with all daily activities, even if they only take a few moments, like when brushing your teeth. As in sitting, your ears should fall over your shoulders and your shoulders should fall over your hips. You should keep a slight tension in your abdominal muscles to brace your spine. Your tailbone should point down to the ground, not behind your body, resulting in an over-extended swayback posture.  INCORRECT STANDING POSTURES  Common incorrect standing postures include a forward head, locked knees and/or an excessive swayback. WALKING Walk with an upright posture. Your ears, shoulders and hips should all line-up. PROLONGED ACTIVITY IN A FLEXED POSITION When completing a task that requires you to bend forward at your waist or lean over a low surface, try to find a way to stabilize 3 out of 4 of your limbs. You can place a hand or elbow on your thigh or rest a knee on the surface you are reaching across. This will provide you more stability, so that your muscles do not tire as quickly. By keeping your knees relaxed, or slightly bent, you will also reduce stress across your lower back. CORRECT LIFTING TECHNIQUES DO :  Assume a wide stance. This will provide you more stability and the opportunity to get as close as possible to the object which you are lifting.  Tense your abdominals to brace your spine. Bend at the knees and hips. Keeping your back locked in a neutral-spine position, lift using your leg muscles. Lift with your legs, keeping your back straight.  Test the weight of unknown objects before attempting to lift them.  Try to keep your elbows locked down at your sides in order get the best strength from your shoulders when carrying an object.  Always ask for help when lifting heavy or awkward objects. INCORRECT LIFTING TECHNIQUES DO NOT:   Lock your knees  when lifting, even if it is a  small object.  Bend and twist. Pivot at your feet or move your feet when needing to change directions.  Assume that you can safely pick up even a paperclip without proper posture.   This information is not intended to replace advice given to you by your health care provider. Make sure you discuss any questions you have with your health care provider.   Document Released: 11/12/2005 Document Revised: 12/03/2014 Document Reviewed: 02/24/2009 [employer] Interactive Patient Education [employer]

## 2016-08-24 NOTE — Assessment & Plan Note (Signed)
Chronic. No sciatic symptoms. No h/o cancer. 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 Accompanied with LE weakness. Patient and I agreed on conservative therapy at this time. Referral to PT. OTC capsaicin.

## 2016-08-24 NOTE — Progress Notes (Signed)
Pre visit review using our clinic review tool, if applicable. No additional management support is needed unless otherwise documented below in the visit note. 

## 2016-08-24 NOTE — Progress Notes (Signed)
Subjective:    Patient ID: Heather Rodgers, female    DOB: [birth year], 70 y.o.   MRN: [id number]  Heather Rodgers is a 70 y.o. female who presents today for an acute visit.    HPI: Patient here with chief complaint of acute low back pain, intermittent, over last several months, unchanged. She is during physical therapy which she had 11/2015 after hospitalization for anemia, subsequent C.diff. LE strength has improved but she would like to be able to get to [facility].  No recent falls. Has stopped alcohol and smoking. No numbness, tingling in LE. Would like added support for low back, feels stiff and gets better when she is moving. Pain when bending over to get things. No hematuria, dysuria, flank pain.   H/o kidney stone No history of cancer    HISTORY:  Past Medical History:  Diagnosis Date  . Anemia   . Arthritis   . Hypertension   . Kidney stones    Past Surgical History:  Procedure Laterality Date  . NO PAST SURGERIES     Family History  Problem Relation Age of Onset  . Heart disease Mother   . Hypertension Mother   . Arthritis Father   . Heart disease Father   . Hypertension Father     Allergies: Penicillins Current Outpatient Prescriptions on File Prior to Visit  Medication Sig Dispense Refill  . aspirin EC 81 MG tablet Take 1 tablet by mouth daily.    . Cholecalciferol (D 5000) 5000 UNITS TABS Take 1 tablet by mouth daily.    . folic acid (FOLVITE) 1 MG tablet Take 1 tablet (1 mg total) by mouth daily. 30 tablet 6  . gabapentin (NEURONTIN) 300 MG capsule Take 1 capsule (300 mg total) by mouth at bedtime. 30 capsule 0  . losartan (COZAAR) 100 MG tablet Take 1 tablet (100 mg total) by mouth daily. 90 tablet 1  . thiamine 100 MG tablet Take 1 tablet (100 mg total) by mouth daily. 30 tablet 0   No current facility-administered medications on file prior to visit.     Social History  Substance Use Topics  . Smoking status: Former Smoker    Packs/day: 1.00    Types:  Cigarettes    Start date: 09/19/1995    Quit date: 12/01/2015  . Smokeless tobacco: Never Used     Comment: Has patches  . Alcohol use No     Comment: none since late December 2016    Review of Systems  Constitutional: Negative for chills and fever.  Respiratory: Negative for cough.   Cardiovascular: Negative for chest pain and palpitations.  Gastrointestinal: Negative for nausea and vomiting.  Genitourinary: Negative for dysuria, flank pain and frequency.  Musculoskeletal: Positive for back pain.  Neurological: Negative for tremors, weakness, numbness and headaches.      Objective:    BP 132/66   Pulse 71   Temp 97.5 F (36.4 C) (Oral)   Ht 5' 2.25" (1.581 m)   Wt 149 lb 12.8 oz (67.9 kg)   SpO2 99%   BMI 27.18 kg/m    Physical Exam  Constitutional: She appears well-developed and well-nourished.  Eyes: Conjunctivae are normal.  Cardiovascular: Normal rate, regular rhythm, normal heart sounds and normal pulses.   Pulmonary/Chest: Effort normal and breath sounds normal. She has no wheezes. She has no rhonchi. She has no rales.  Abdominal: There is no CVA tenderness.  Musculoskeletal:       Lumbar back: She exhibits  decreased range of motion. She exhibits no tenderness, no bony tenderness, no swelling, no edema, no pain and no spasm.  Slight reduction in range of motion with flexion, tension, lateral side bends. No bony tenderness. No pain, numbness, tingling elicited with single leg raise bilaterally.   Neurological: She is alert. She has normal strength. No sensory deficit.  Reflex Scores:      Patellar reflexes are 2+ on the right side and 2+ on the left side. Sensation intact bilateral lower extremities. Strength bilateral lower extremities 4/5.   Skin: Skin is warm and dry.  Psychiatric: She has a normal mood and affect. Her speech is normal and behavior is normal. Thought content normal.  Vitals reviewed.      Assessment & Plan:    Problem List Items  Addressed This Visit      Other   Low back pain - Primary    Chronic. No sciatic symptoms. No h/o cancer. 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 Accompanied with LE weakness. Patient and I agreed on conservative therapy at this time. Referral to PT. OTC capsaicin.        Relevant Orders   Ambulatory referral to Physical Therapy    Other Visit Diagnoses   None.      I am having Ms. Schooley maintain her aspirin EC, Cholecalciferol, thiamine, losartan, folic acid, and gabapentin.   No orders of the defined types were placed in this encounter.   Return precautions given.   Risks, benefits, and alternatives of the medications and treatment plan prescribed today were discussed, and patient expressed understanding.   Education regarding symptom management and diagnosis given to patient on AVS.  Continue to follow with Rennie PlowmanMargaret Arnett, FNP for routine health maintenance.   Heather Rodgers and I agreed with plan.   Rennie PlowmanMargaret Arnett, FNP

## 2016-09-07 DIAGNOSIS — H40033 Anatomical narrow angle, bilateral: Secondary | ICD-10-CM | POA: Diagnosis not present

## 2016-09-07 DIAGNOSIS — H2513 Age-related nuclear cataract, bilateral: Secondary | ICD-10-CM | POA: Diagnosis not present

## 2016-09-07 DIAGNOSIS — H40031 Anatomical narrow angle, right eye: Secondary | ICD-10-CM | POA: Diagnosis not present

## 2016-09-07 DIAGNOSIS — H2511 Age-related nuclear cataract, right eye: Secondary | ICD-10-CM | POA: Diagnosis not present

## 2016-09-13 DIAGNOSIS — H35033 Hypertensive retinopathy, bilateral: Secondary | ICD-10-CM | POA: Diagnosis not present

## 2016-09-13 DIAGNOSIS — H5203 Hypermetropia, bilateral: Secondary | ICD-10-CM | POA: Diagnosis not present

## 2016-09-13 DIAGNOSIS — I10 Essential (primary) hypertension: Secondary | ICD-10-CM | POA: Diagnosis not present

## 2016-09-13 DIAGNOSIS — H40033 Anatomical narrow angle, bilateral: Secondary | ICD-10-CM | POA: Diagnosis not present

## 2016-09-13 DIAGNOSIS — H2513 Age-related nuclear cataract, bilateral: Secondary | ICD-10-CM | POA: Diagnosis not present

## 2016-09-13 DIAGNOSIS — H52223 Regular astigmatism, bilateral: Secondary | ICD-10-CM | POA: Diagnosis not present

## 2016-09-17 ENCOUNTER — Other Ambulatory Visit: Payer: Self-pay | Admitting: Nurse Practitioner

## 2016-10-02 DIAGNOSIS — H40032 Anatomical narrow angle, left eye: Secondary | ICD-10-CM | POA: Diagnosis not present

## 2016-10-09 DIAGNOSIS — H2513 Age-related nuclear cataract, bilateral: Secondary | ICD-10-CM | POA: Diagnosis not present

## 2016-10-09 DIAGNOSIS — H52223 Regular astigmatism, bilateral: Secondary | ICD-10-CM | POA: Diagnosis not present

## 2016-10-09 DIAGNOSIS — I10 Essential (primary) hypertension: Secondary | ICD-10-CM | POA: Diagnosis not present

## 2016-10-09 DIAGNOSIS — H40033 Anatomical narrow angle, bilateral: Secondary | ICD-10-CM | POA: Diagnosis not present

## 2016-10-09 DIAGNOSIS — H35033 Hypertensive retinopathy, bilateral: Secondary | ICD-10-CM | POA: Diagnosis not present

## 2016-10-09 DIAGNOSIS — H5203 Hypermetropia, bilateral: Secondary | ICD-10-CM | POA: Diagnosis not present

## 2016-10-29 ENCOUNTER — Encounter: Payer: PPO | Admitting: Family

## 2016-10-29 DIAGNOSIS — H35033 Hypertensive retinopathy, bilateral: Secondary | ICD-10-CM | POA: Diagnosis not present

## 2016-10-29 DIAGNOSIS — H47391 Other disorders of optic disc, right eye: Secondary | ICD-10-CM | POA: Diagnosis not present

## 2016-10-29 DIAGNOSIS — H43821 Vitreomacular adhesion, right eye: Secondary | ICD-10-CM | POA: Diagnosis not present

## 2016-11-12 DIAGNOSIS — H35033 Hypertensive retinopathy, bilateral: Secondary | ICD-10-CM | POA: Diagnosis not present

## 2016-11-12 DIAGNOSIS — I10 Essential (primary) hypertension: Secondary | ICD-10-CM | POA: Diagnosis not present

## 2016-11-12 DIAGNOSIS — H5203 Hypermetropia, bilateral: Secondary | ICD-10-CM | POA: Diagnosis not present

## 2016-11-12 DIAGNOSIS — H59031 Cystoid macular edema following cataract surgery, right eye: Secondary | ICD-10-CM | POA: Diagnosis not present

## 2016-11-12 DIAGNOSIS — H40033 Anatomical narrow angle, bilateral: Secondary | ICD-10-CM | POA: Diagnosis not present

## 2016-11-12 DIAGNOSIS — H2511 Age-related nuclear cataract, right eye: Secondary | ICD-10-CM | POA: Diagnosis not present

## 2016-11-12 DIAGNOSIS — H52223 Regular astigmatism, bilateral: Secondary | ICD-10-CM | POA: Diagnosis not present

## 2016-11-13 DIAGNOSIS — H2512 Age-related nuclear cataract, left eye: Secondary | ICD-10-CM | POA: Diagnosis not present

## 2016-11-30 DIAGNOSIS — H5203 Hypermetropia, bilateral: Secondary | ICD-10-CM | POA: Diagnosis not present

## 2016-11-30 DIAGNOSIS — H52223 Regular astigmatism, bilateral: Secondary | ICD-10-CM | POA: Diagnosis not present

## 2016-11-30 DIAGNOSIS — H35033 Hypertensive retinopathy, bilateral: Secondary | ICD-10-CM | POA: Diagnosis not present

## 2016-11-30 DIAGNOSIS — I10 Essential (primary) hypertension: Secondary | ICD-10-CM | POA: Diagnosis not present

## 2016-11-30 DIAGNOSIS — H40033 Anatomical narrow angle, bilateral: Secondary | ICD-10-CM | POA: Diagnosis not present

## 2016-11-30 DIAGNOSIS — H59031 Cystoid macular edema following cataract surgery, right eye: Secondary | ICD-10-CM | POA: Diagnosis not present

## 2016-11-30 DIAGNOSIS — H2512 Age-related nuclear cataract, left eye: Secondary | ICD-10-CM | POA: Diagnosis not present

## 2016-11-30 DIAGNOSIS — H25812 Combined forms of age-related cataract, left eye: Secondary | ICD-10-CM | POA: Diagnosis not present

## 2016-12-25 DIAGNOSIS — H43823 Vitreomacular adhesion, bilateral: Secondary | ICD-10-CM | POA: Diagnosis not present

## 2016-12-27 DIAGNOSIS — H35033 Hypertensive retinopathy, bilateral: Secondary | ICD-10-CM | POA: Diagnosis not present

## 2016-12-27 DIAGNOSIS — H3023 Posterior cyclitis, bilateral: Secondary | ICD-10-CM | POA: Diagnosis not present

## 2016-12-27 DIAGNOSIS — H43823 Vitreomacular adhesion, bilateral: Secondary | ICD-10-CM | POA: Diagnosis not present

## 2016-12-27 DIAGNOSIS — H59033 Cystoid macular edema following cataract surgery, bilateral: Secondary | ICD-10-CM | POA: Diagnosis not present

## 2017-01-01 NOTE — Progress Notes (Signed)
Subjective:    Patient ID: Heather Rodgers, female    DOB: 11-14-1946, 71 y.o.   MRN: [id number]  CC: Heather Rodgers is a 71 y.o. female who presents today for physical exam.    HPI: Arthritis- takes PRN gabapentin for pain in feet, hands. Sometimes numbness in toes.   Sober- continues to take folate and thiamine.   HTN- doesn't check at home; Denies exertional chest pain or pressure, numbness or tingling radiating to left arm or jaw, palpitations, dizziness, frequent headaches, changes in vision, or shortness of breath.      Colorectal Cancer Screening: UTD 2 years ago, normal. Unable to see results. Per patient, on 10 year schedule Breast Cancer Screening: Mammogram due Cervical Cancer Screening: approx 3 years ago at 67 years. No vaginal bleeding, abdominal distention.  Bone Health screening/DEXA for 65+: No DEXA has been done Lung Cancer Screening: Former smoker of 30 years.        Tetanus - UTD        Pneumococcal - Candidate for; unsure which PNA vaccine was given 3 years ago at 67 Labs: Screening labs today. Exercise: plans to join this week.  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  Regular dental exams: In need of dental exam. Wears seat belt: Yes. Skin : no new lesions.   HISTORY:  Past Medical History:  Diagnosis Date  . Alcohol abuse 2017   rehab 11/2015  . Anemia   . Arthritis   . Colitis 2017   c.difficile  . Hypertension   . Kidney stones     Past Surgical History:  Procedure Laterality Date  . NO PAST SURGERIES     Family History  Problem Relation Age of Onset  . Heart disease Mother   . Hypertension Mother   . Arthritis Father   . Heart disease Father   . Hypertension Father       ALLERGIES: Penicillins  Current Outpatient Prescriptions on File Prior to Visit  Medication Sig Dispense Refill  . aspirin EC 81 MG tablet Take 1 tablet by mouth daily.    . Cholecalciferol (D 5000) 5000  UNITS TABS Take 1 tablet by mouth daily.    . folic acid (FOLVITE) 1 MG tablet Take 1 tablet (1 mg total) by mouth daily. 30 tablet 6  . gabapentin (NEURONTIN) 300 MG capsule Take 1 capsule (300 mg total) by mouth at bedtime. 30 capsule 0  . losartan (COZAAR) 100 MG tablet TAKE ONE (1) TABLET EACH DAY 90 tablet 1  . thiamine 100 MG tablet Take 1 tablet (100 mg total) by mouth daily. 30 tablet 0   No current facility-administered medications on file prior to visit.     Social History  Substance Use Topics  . Smoking status: Former Smoker    Packs/day: 1.00    Types: Cigarettes    Start date: 09/19/1995    Quit date: 12/01/2015  . Smokeless tobacco: Never Used     Comment: Has patches  . Alcohol use No     Comment: sober since late December 2016    Review of Systems  Constitutional: Negative for chills, fever and unexpected weight change.  HENT: Negative for congestion.   Respiratory: Negative for cough.   Cardiovascular: Negative for chest pain, palpitations and leg swelling.  Gastrointestinal: Negative for abdominal distention, nausea and vomiting.  Genitourinary: Negative for vaginal bleeding.  Musculoskeletal: Negative for arthralgias and myalgias.  Skin: Negative for rash.  Neurological:  Negative for headaches.  Hematological: Negative for adenopathy.  Psychiatric/Behavioral: Negative for confusion.      Objective:    BP (!) 150/68   Pulse 69   Temp 98.1 F (36.7 C) (Oral)   Ht 5' 2.25" (1.581 m)   Wt 154 lb 3.2 oz (69.9 kg)   SpO2 98%   BMI 27.98 kg/m   BP Readings from Last 3 Encounters:  01/02/17 (!) 150/68  08/24/16 132/66  01/30/16 130/74   Wt Readings from Last 3 Encounters:  01/02/17 154 lb 3.2 oz (69.9 kg)  08/24/16 149 lb 12.8 oz (67.9 kg)  01/30/16 142 lb (64.4 kg)    Physical Exam  Constitutional: She appears well-developed and well-nourished.  Eyes: Conjunctivae are normal.  Neck: No thyroid mass and no thyromegaly present.  Cardiovascular:  Normal rate, regular rhythm, normal heart sounds and normal pulses.   Pulmonary/Chest: Effort normal and breath sounds normal. She has no wheezes. She has no rhonchi. She has no rales. Right breast exhibits no inverted nipple, no mass, no nipple discharge, no skin change and no tenderness. Left breast exhibits no inverted nipple, no mass, no nipple discharge, no skin change and no tenderness. Breasts are symmetrical.  CBE performed.   Lymphadenopathy:       Head (right side): No submental, no submandibular, no tonsillar, no preauricular, no posterior auricular and no occipital adenopathy present.       Head (left side): No submental, no submandibular, no tonsillar, no preauricular, no posterior auricular and no occipital adenopathy present.    She has no cervical adenopathy.       Right cervical: No superficial cervical, no deep cervical and no posterior cervical adenopathy present.      Left cervical: No superficial cervical, no deep cervical and no posterior cervical adenopathy present.    She has no axillary adenopathy.  Neurological: She is alert.  Skin: Skin is warm and dry.  Psychiatric: She has a normal mood and affect. Her speech is normal and behavior is normal. Thought content normal.  Vitals reviewed.      Assessment & Plan:   Problem List Items Addressed This Visit      Cardiovascular and Mediastinum   HTN (hypertension)    Suspect elevated from recent weight gain. Start low dose amlodipine. Will keep BP log. Hope can come off once patient starts exercising.Pending cmp.      Relevant Medications   amLODipine (NORVASC) 2.5 MG tablet     Musculoskeletal and Integument   Arthritis, degenerative    Stable. On prn gabapentin.         Other   Routine physical examination - Primary    Up-to-date on colonoscopy. Mammogram due. Unsure exactly if pap was at 71 years of age. I also do not have records of this. Patient prefers to do pap at next visit. She understands to make f/u  appt for pap smear. She's not had a DEXA scan, ordered. Former smoker and meets criteria for CT lung scan. Up-to-date on tetanus. Given prevnar today as don't know which pneumonia vaccine was given 3 years ago. Labs today including anemia and urine ( h/o of proteinuria). Encouraged exercise. Congratulated patient on sobriety.       Relevant Medications   pneumococcal 13-valent conjugate vaccine (PREVNAR 13) injection 0.5 mL   Other Relevant Orders   CBC with Differential/Platelet   Comprehensive metabolic panel   Hemoglobin A1c   Lipid panel   TSH   VITAMIN D 25 Hydroxy (Vit-D  Deficiency, Fractures)   MM DIGITAL SCREENING BILATERAL   DG Bone Density   CT CHEST LUNG CANCER SCREENING LOW DOSE WO CONTRAST   B12 and Folate Panel   IBC panel   Urinalysis, Routine w reflex microscopic   Microalbumin / creatinine urine ratio       I am having Ms. Hoeg start on amLODipine. I am also having her maintain her aspirin EC, Cholecalciferol, thiamine, folic acid, gabapentin, and losartan. We will continue to administer pneumococcal 13-valent conjugate vaccine.   Meds ordered this encounter  Medications  . amLODipine (NORVASC) 2.5 MG tablet    Sig: Take 1 tablet (2.5 mg total) by mouth daily.    Dispense:  90 tablet    Refill:  3    Order Specific Question:   Supervising Provider    Answer:   Duncan Dull L [[id number]]  . pneumococcal 13-valent conjugate vaccine (PREVNAR 13) injection 0.5 mL    Return precautions given.   Risks, benefits, and alternatives of the medications and treatment plan prescribed today were discussed, and patient expressed understanding.   Education regarding symptom management and diagnosis given to patient on AVS.   Continue to follow with [name], FNP for routine health maintenance.   Heather Rodgers and I agreed with plan.   [name], FNP

## 2017-01-02 ENCOUNTER — Encounter: Payer: Self-pay | Admitting: Family

## 2017-01-02 ENCOUNTER — Ambulatory Visit (INDEPENDENT_AMBULATORY_CARE_PROVIDER_SITE_OTHER): Payer: PPO | Admitting: Family

## 2017-01-02 VITALS — BP 150/68 | HR 69 | Temp 98.1°F | Ht 62.25 in | Wt 154.2 lb

## 2017-01-02 DIAGNOSIS — M1990 Unspecified osteoarthritis, unspecified site: Secondary | ICD-10-CM

## 2017-01-02 DIAGNOSIS — Z0000 Encounter for general adult medical examination without abnormal findings: Secondary | ICD-10-CM | POA: Diagnosis not present

## 2017-01-02 DIAGNOSIS — I10 Essential (primary) hypertension: Secondary | ICD-10-CM

## 2017-01-02 LAB — URINALYSIS, ROUTINE W REFLEX MICROSCOPIC
Bilirubin Urine: NEGATIVE
HGB URINE DIPSTICK: NEGATIVE
KETONES UR: NEGATIVE
Leukocytes, UA: NEGATIVE
NITRITE: NEGATIVE
PH: 7 (ref 5.0–8.0)
RBC / HPF: NONE SEEN (ref 0–?)
Specific Gravity, Urine: 1.01 (ref 1.000–1.030)
Urine Glucose: NEGATIVE
Urobilinogen, UA: 0.2 (ref 0.0–1.0)

## 2017-01-02 LAB — CBC WITH DIFFERENTIAL/PLATELET
BASOS PCT: 0.8 % (ref 0.0–3.0)
Basophils Absolute: 0.1 10*3/uL (ref 0.0–0.1)
EOS ABS: 0.1 10*3/uL (ref 0.0–0.7)
EOS PCT: 0.7 % (ref 0.0–5.0)
HEMATOCRIT: 38.4 % (ref 36.0–46.0)
HEMOGLOBIN: 12.9 g/dL (ref 12.0–15.0)
LYMPHS PCT: 23 % (ref 12.0–46.0)
Lymphs Abs: 2.1 10*3/uL (ref 0.7–4.0)
MCHC: 33.7 g/dL (ref 30.0–36.0)
MCV: 92.6 fl (ref 78.0–100.0)
Monocytes Absolute: 0.7 10*3/uL (ref 0.1–1.0)
Monocytes Relative: 7 % (ref 3.0–12.0)
Neutro Abs: 6.4 10*3/uL (ref 1.4–7.7)
Neutrophils Relative %: 68.5 % (ref 43.0–77.0)
Platelets: 247 10*3/uL (ref 150.0–400.0)
RBC: 4.14 Mil/uL (ref 3.87–5.11)
RDW: 14.9 % (ref 11.5–15.5)
WBC: 9.3 10*3/uL (ref 4.0–10.5)

## 2017-01-02 LAB — B12 AND FOLATE PANEL: VITAMIN B 12: 334 pg/mL (ref 211–911)

## 2017-01-02 LAB — TSH: TSH: 3.45 u[IU]/mL (ref 0.35–4.50)

## 2017-01-02 LAB — LIPID PANEL
CHOLESTEROL: 175 mg/dL (ref 0–200)
HDL: 53.8 mg/dL (ref >39.00)
LDL Cholesterol: 107 mg/dL — ABNORMAL HIGH (ref 0–99)
NonHDL: 121.05
Total CHOL/HDL Ratio: 3
Triglycerides: 69 mg/dL (ref 0.0–149.0)
VLDL: 13.8 mg/dL (ref 0.0–40.0)

## 2017-01-02 LAB — COMPREHENSIVE METABOLIC PANEL
ALBUMIN: 4.3 g/dL (ref 3.5–5.2)
ALK PHOS: 58 U/L (ref 39–117)
ALT: 9 U/L (ref 0–35)
AST: 10 U/L (ref 0–37)
BUN: 16 mg/dL (ref 6–23)
CALCIUM: 9.7 mg/dL (ref 8.4–10.5)
CHLORIDE: 106 meq/L (ref 96–112)
CO2: 28 mEq/L (ref 19–32)
CREATININE: 0.68 mg/dL (ref 0.40–1.20)
GFR: 91.23 mL/min (ref >60.00)
Glucose, Bld: 86 mg/dL (ref 70–99)
Potassium: 4.7 mEq/L (ref 3.5–5.1)
Sodium: 138 mEq/L (ref 135–145)
Total Bilirubin: 0.6 mg/dL (ref 0.2–1.2)
Total Protein: 7.5 g/dL (ref 6.0–8.3)

## 2017-01-02 LAB — HEMOGLOBIN A1C: HEMOGLOBIN A1C: 6 % (ref 4.6–6.5)

## 2017-01-02 LAB — MICROALBUMIN / CREATININE URINE RATIO
Creatinine,U: 55.9 mg/dL
MICROALB UR: 18.2 mg/dL — AB (ref 0.0–1.9)
Microalb Creat Ratio: 32.6 mg/g — ABNORMAL HIGH (ref 0.0–30.0)

## 2017-01-02 LAB — IBC PANEL
IRON: 90 ug/dL (ref 42–145)
SATURATION RATIOS: 19.8 % — AB (ref 20.0–50.0)
TRANSFERRIN: 325 mg/dL (ref 212.0–360.0)

## 2017-01-02 LAB — VITAMIN D 25 HYDROXY (VIT D DEFICIENCY, FRACTURES): VITD: 67.71 ng/mL (ref 30.00–100.00)

## 2017-01-02 MED ORDER — AMLODIPINE BESYLATE 2.5 MG PO TABS: 2.5000 mg | ORAL_TABLET | Freq: Every day | ORAL | 3 refills | Status: DC

## 2017-01-02 MED ORDER — PNEUMOCOCCAL 13-VAL CONJ VACC IM SUSP
0.5000 mL | Freq: Once | INTRAMUSCULAR | Status: AC
  Administered 2017-01-02: 0.5 mL via INTRAMUSCULAR

## 2017-01-02 NOTE — Assessment & Plan Note (Addendum)
Suspect elevated from recent weight gain. Start low dose amlodipine. Will keep BP log. Hope can come off once patient starts exercising.Pending cmp.

## 2017-01-02 NOTE — Assessment & Plan Note (Signed)
Up-to-date on colonoscopy. Mammogram due. Unsure exactly if pap was at 71 years of age. I also do not have records of this. Patient prefers to do pap at next visit. She understands to make f/u appt for pap smear. She's not had a DEXA scan, ordered. Former smoker and meets criteria for CT lung scan. Up-to-date on tetanus. Given prevnar today as don't know which pneumonia vaccine was given 3 years ago. Labs today including anemia and urine ( h/o of proteinuria). Encouraged exercise. Congratulated patient on sobriety.

## 2017-01-02 NOTE — Assessment & Plan Note (Signed)
Stable. On prn gabapentin.

## 2017-01-02 NOTE — Progress Notes (Signed)
Pre visit review using our clinic review tool, if applicable. No additional management support is needed unless otherwise documented below in the visit note. 

## 2017-01-02 NOTE — Patient Instructions (Addendum)
Labs today  Suspect elevated from recent weight gain. Start low dose amlodipine.  Please check BP 3-4 times per week and call if persistently higher than 140/90.  We placed a referral. Mammogram this year. I asked that you call one the below locations and schedule this when it is convenient for you.   If you have dense breasts, you may ask for 3D mammogram over the traditional 2D mammogram as new evidence suggest 3D is superior. Please note that NOT all insurance companies cover 3D and you may have to pay a higher copay. You may call your insurance company to further clarify your benefits.   Options for [place]  [place], [place]  * [facility] [place], [place] * Note if you ask for 3D mammogram at this location, you must request [place], [place] location*       Health Maintenance, Female Introduction Adopting a healthy lifestyle and getting preventive care can go a long way to promote health and wellness. Talk with your health care provider about what schedule of regular examinations is right for you. This is a good chance for you to check in with your provider about disease prevention and staying healthy. In between checkups, there are plenty of things you can do on your own. Experts have done a lot of research about which lifestyle changes and preventive measures are most likely to keep you healthy. Ask your health care provider for more information. Weight and diet Eat a healthy diet  Be sure to include plenty of vegetables, fruits, low-fat dairy products, and lean protein.  Do not eat a lot of foods high in solid fats, added sugars, or salt.  Get regular exercise. This is one of the most important things you can do for your health.  Most adults should exercise for at least 150 minutes each week. The exercise should  increase your heart rate and make you sweat (moderate-intensity exercise).  Most adults should also do strengthening exercises at least twice a week. This is in addition to the moderate-intensity exercise. Maintain a healthy weight  Body mass index (BMI) is a measurement that can be used to identify possible weight problems. It estimates body fat based on height and weight. Your health care provider can help determine your BMI and help you achieve or maintain a healthy weight.  For females 67 years of age and older:  A BMI below 18.5 is considered underweight.  A BMI of 18.5 to 24.9 is normal.  A BMI of 25 to 29.9 is considered overweight.  A BMI of 30 and above is considered obese. Watch levels of cholesterol and blood lipids  You should start having your blood tested for lipids and cholesterol at 69 years of age, then have this test every 5 years.  You may need to have your cholesterol levels checked more often if:  Your lipid or cholesterol levels are high.  You are older than 69 years of age.  You are at high risk for heart disease. Cancer screening Lung Cancer  Lung cancer screening is recommended for adults 48-35 years old who are at high risk for lung cancer because of a history of smoking.  A yearly low-dose CT scan of the lungs is recommended for people who:  Currently smoke.  Have quit within the past 15 years.  Have at least a 30-pack-year  history of smoking. A pack year is smoking an average of one pack of cigarettes a day for 1 year.  Yearly screening should continue until it has been 15 years since you quit.  Yearly screening should stop if you develop a health problem that would prevent you from having lung cancer treatment. Breast Cancer  Practice breast self-awareness. This means understanding how your breasts normally appear and feel.  It also means doing regular breast self-exams. Let your health care provider know about any changes, no matter how  small.  If you are in your 20s or 30s, you should have a clinical breast exam (CBE) by a health care provider every 1-3 years as part of a regular health exam.  If you are 28 or older, have a CBE every year. Also consider having a breast X-ray (mammogram) every year.  If you have a family history of breast cancer, talk to your health care provider about genetic screening.  If you are at high risk for breast cancer, talk to your health care provider about having an MRI and a mammogram every year.  Breast cancer gene (BRCA) assessment is recommended for women who have family members with BRCA-related cancers. BRCA-related cancers include:  Breast.  Ovarian.  Tubal.  Peritoneal cancers.  Results of the assessment will determine the need for genetic counseling and BRCA1 and BRCA2 testing. Cervical Cancer  Your health care provider may recommend that you be screened regularly for cancer of the pelvic organs (ovaries, uterus, and vagina). This screening involves a pelvic examination, including checking for microscopic changes to the surface of your cervix (Pap test). You may be encouraged to have this screening done every 3 years, beginning at age 21.  For women ages 83-65, health care providers may recommend pelvic exams and Pap testing every 3 years, or they may recommend the Pap and pelvic exam, combined with testing for human papilloma virus (HPV), every 5 years. Some types of HPV increase your risk of cervical cancer. Testing for HPV may also be done on women of any age with unclear Pap test results.  Other health care providers may not recommend any screening for nonpregnant women who are considered low risk for pelvic cancer and who do not have symptoms. Ask your health care provider if a screening pelvic exam is right for you.  If you have had past treatment for cervical cancer or a condition that could lead to cancer, you need Pap tests and screening for cancer for at least 20 years  after your treatment. If Pap tests have been discontinued, your risk factors (such as having a new sexual partner) need to be reassessed to determine if screening should resume. Some women have medical problems that increase the chance of getting cervical cancer. In these cases, your health care provider may recommend more frequent screening and Pap tests. Colorectal Cancer  This type of cancer can be detected and often prevented.  Routine colorectal cancer screening usually begins at 69 years of age and continues through 69 years of age.  Your health care provider may recommend screening at an earlier age if you have risk factors for colon cancer.  Your health care provider may also recommend using home test kits to check for hidden blood in the stool.  A small camera at the end of a tube can be used to examine your colon directly (sigmoidoscopy or colonoscopy). This is done to check for the earliest forms of colorectal cancer.  Routine screening usually begins at  age 54.  Direct examination of the colon should be repeated every 5-10 years through 69 years of age. However, you may need to be screened more often if early forms of precancerous polyps or small growths are found. Skin Cancer  Check your skin from head to toe regularly.  Tell your health care provider about any new moles or changes in moles, especially if there is a change in a mole's shape or color.  Also tell your health care provider if you have a mole that is larger than the size of a pencil eraser.  Always use sunscreen. Apply sunscreen liberally and repeatedly throughout the day.  Protect yourself by wearing long sleeves, pants, a wide-brimmed hat, and sunglasses whenever you are outside. Heart disease, diabetes, and high blood pressure  High blood pressure causes heart disease and increases the risk of stroke. High blood pressure is more likely to develop in:  People who have blood pressure in the high end of the  normal range (130-139/85-89 mm Hg).  People who are overweight or obese.  People who are African American.  If you are 104-93 years of age, have your blood pressure checked every 3-5 years. If you are 41 years of age or older, have your blood pressure checked every year. You should have your blood pressure measured twice-once when you are at a hospital or clinic, and once when you are not at a hospital or clinic. Record the average of the two measurements. To check your blood pressure when you are not at a hospital or clinic, you can use:  An automated blood pressure machine at a pharmacy.  A home blood pressure monitor.  If you are between 26 years and 64 years old, ask your health care provider if you should take aspirin to prevent strokes.  Have regular diabetes screenings. This involves taking a blood sample to check your fasting blood sugar level.  If you are at a normal weight and have a low risk for diabetes, have this test once every three years after 69 years of age.  If you are overweight and have a high risk for diabetes, consider being tested at a younger age or more often. Preventing infection Hepatitis B  If you have a higher risk for hepatitis B, you should be screened for this virus. You are considered at high risk for hepatitis B if:  You were born in a country where hepatitis B is common. Ask your health care provider which countries are considered high risk.  Your parents were born in a high-risk country, and you have not been immunized against hepatitis B (hepatitis B vaccine).  You have HIV or AIDS.  You use needles to inject street drugs.  You live with someone who has hepatitis B.  You have had sex with someone who has hepatitis B.  You get hemodialysis treatment.  You take certain medicines for conditions, including cancer, organ transplantation, and autoimmune conditions. Hepatitis C  Blood testing is recommended for:  Everyone born from [birth year] through  1963.  Anyone with known risk factors for hepatitis C. Sexually transmitted infections (STIs)  You should be screened for sexually transmitted infections (STIs) including gonorrhea and chlamydia if:  You are sexually active and are younger than 69 years of age.  You are older than 69 years of age and your health care provider tells you that you are at risk for this type of infection.  Your sexual activity has changed since you were last screened and you  are at an increased risk for chlamydia or gonorrhea. Ask your health care provider if you are at risk.  If you do not have HIV, but are at risk, it may be recommended that you take a prescription medicine daily to prevent HIV infection. This is called pre-exposure prophylaxis (PrEP). You are considered at risk if:  You are sexually active and do not regularly use condoms or know the HIV status of your partner(s).  You take drugs by injection.  You are sexually active with a partner who has HIV. Talk with your health care provider about whether you are at high risk of being infected with HIV. If you choose to begin PrEP, you should first be tested for HIV. You should then be tested every 3 months for as long as you are taking PrEP. Pregnancy  If you are premenopausal and you may become pregnant, ask your health care provider about preconception counseling.  If you may become pregnant, take 400 to 800 micrograms (mcg) of folic acid every day.  If you want to prevent pregnancy, talk to your health care provider about birth control (contraception). Osteoporosis and menopause  Osteoporosis is a disease in which the bones lose minerals and strength with aging. This can result in serious bone fractures. Your risk for osteoporosis can be identified using a bone density scan.  If you are 73 years of age or older, or if you are at risk for osteoporosis and fractures, ask your health care provider if you should be screened.  Ask your health  care provider whether you should take a calcium or vitamin D supplement to lower your risk for osteoporosis.  Menopause may have certain physical symptoms and risks.  Hormone replacement therapy may reduce some of these symptoms and risks. Talk to your health care provider about whether hormone replacement therapy is right for you. Follow these instructions at home:  Schedule regular health, dental, and eye exams.  Stay current with your immunizations.  Do not use any tobacco products including cigarettes, chewing tobacco, or electronic cigarettes.  If you are pregnant, do not drink alcohol.  If you are breastfeeding, limit how much and how often you drink alcohol.  Limit alcohol intake to no more than 1 drink per day for nonpregnant women. One drink equals 12 ounces of beer, 5 ounces of wine, or 1 ounces of hard liquor.  Do not use street drugs.  Do not share needles.  Ask your health care provider for help if you need support or information about quitting drugs.  Tell your health care provider if you often feel depressed.  Tell your health care provider if you have ever been abused or do not feel safe at home. This information is not intended to replace advice given to you by your health care provider. Make sure you discuss any questions you have with your health care provider. Document Released: 05/28/2011 Document Revised: 04/19/2016 Document Reviewed: 08/16/2015  2017 [employer] [id number]

## 2017-01-03 ENCOUNTER — Encounter: Payer: Self-pay | Admitting: *Deleted

## 2017-01-03 ENCOUNTER — Telehealth: Payer: Self-pay | Admitting: *Deleted

## 2017-01-03 DIAGNOSIS — Z87891 Personal history of nicotine dependence: Secondary | ICD-10-CM

## 2017-01-03 NOTE — Telephone Encounter (Signed)
Received referral for initial lung cancer screening scan. Contacted patient and obtained smoking history,(former, quit 12/01/15, 47 pack year) as well as answering questions related to screening process. Patient denies signs of lung cancer such as weight loss or hemoptysis. Patient denies comorbidity that would prevent curative treatment if lung cancer were found. Patient is tentatively scheduled for shared decision making visit and CT scan on 01/29/17 at 1:30pm, pending insurance approval from business office.

## 2017-01-10 ENCOUNTER — Other Ambulatory Visit: Payer: Self-pay | Admitting: Family

## 2017-01-10 DIAGNOSIS — R801 Persistent proteinuria, unspecified: Secondary | ICD-10-CM

## 2017-01-10 DIAGNOSIS — E785 Hyperlipidemia, unspecified: Secondary | ICD-10-CM

## 2017-01-10 MED ORDER — PRAVASTATIN SODIUM 40 MG PO TABS: 40.0000 mg | ORAL_TABLET | Freq: Every day | ORAL | 2 refills | Status: DC

## 2017-01-17 DIAGNOSIS — H59033 Cystoid macular edema following cataract surgery, bilateral: Secondary | ICD-10-CM | POA: Diagnosis not present

## 2017-01-29 ENCOUNTER — Ambulatory Visit: Payer: PPO

## 2017-01-29 ENCOUNTER — Inpatient Hospital Stay: Payer: PPO | Attending: Oncology | Admitting: Oncology

## 2017-01-29 ENCOUNTER — Encounter: Payer: Self-pay | Admitting: Oncology

## 2017-01-29 ENCOUNTER — Ambulatory Visit
Admission: RE | Admit: 2017-01-29 | Discharge: 2017-01-29 | Disposition: A | Discharge status: Home / Self Care | Payer: PPO | Source: Ambulatory Visit | Attending: Oncology | Admitting: Oncology

## 2017-01-29 DIAGNOSIS — Z87891 Personal history of nicotine dependence: Secondary | ICD-10-CM | POA: Insufficient documentation

## 2017-01-29 DIAGNOSIS — I700 Atherosclerosis of aorta: Secondary | ICD-10-CM | POA: Insufficient documentation

## 2017-01-29 DIAGNOSIS — K7460 Unspecified cirrhosis of liver: Secondary | ICD-10-CM | POA: Insufficient documentation

## 2017-01-29 DIAGNOSIS — Z122 Encounter for screening for malignant neoplasm of respiratory organs: Secondary | ICD-10-CM | POA: Insufficient documentation

## 2017-01-29 DIAGNOSIS — I2510 Atherosclerotic heart disease of native coronary artery without angina pectoris: Secondary | ICD-10-CM | POA: Insufficient documentation

## 2017-01-29 NOTE — Progress Notes (Signed)
Personal history of tobacco use, presenting hazards to health In accordance with CMS guidelines, patient has met eligibility criteria including age, absence of signs or symptoms of lung cancer.  Social History  Substance Use Topics  . Smoking status: Former Smoker    Packs/day: 1.00    Years: 47.00    Types: Cigarettes    Start date: 09/19/1995    Quit date: 12/01/2015  . Smokeless tobacco: Never Used     Comment: Has patches  . 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 This includes one or more decision aids, includes benefits and harms of screening, follow-up diagnostic testing, over-diagnosis, false positive rate, and total radiation exposure.  Counseling on the importance of adherence to annual lung cancer LDCT screening, impact of co-morbidities, and ability or willingness to undergo diagnosis and treatment is imperative for compliance of the program.  Counseling on the importance of continued smoking cessation for former smokers; the importance of smoking cessation for current smokers, and information about tobacco cessation interventions have been given to patient including [address] and [address] quit  programs.  Written order for lung cancer screening with LDCT has been given to the patient and any and all questions have been answered to the best of my abilities.   Yearly follow up will be coordinated by Burgess Estelle, Thoracic Navigator.   Lucendia Herrlich, NP  01/29/17 2:25 PM

## 2017-01-29 NOTE — Assessment & Plan Note (Signed)
In accordance with CMS guidelines, patient has met eligibility criteria including age, absence of signs or symptoms of lung cancer.  Social History  Substance Use Topics  . Smoking status: Former Smoker    Packs/day: 1.00    Years: 47.00    Types: Cigarettes    Start date: 09/19/1995    Quit date: 12/01/2015  . Smokeless tobacco: Never Used     Comment: Has patches  . 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 This includes one or more decision aids, includes benefits and harms of screening, follow-up diagnostic testing, over-diagnosis, false positive rate, and total radiation exposure.  Counseling on the importance of adherence to annual lung cancer LDCT screening, impact of co-morbidities, and ability or willingness to undergo diagnosis and treatment is imperative for compliance of the program.  Counseling on the importance of continued smoking cessation for former smokers; the importance of smoking cessation for current smokers, and information about tobacco cessation interventions have been given to patient including [place] and [address] quit [place] programs.  Written order for lung cancer screening with LDCT has been given to the patient and any and all questions have been answered to the best of my abilities.   Yearly follow up will be coordinated by Burgess Estelle, Thoracic Navigator.

## 2017-01-31 ENCOUNTER — Encounter: Payer: Self-pay | Admitting: *Deleted

## 2017-02-08 ENCOUNTER — Telehealth: Payer: Self-pay | Admitting: *Deleted

## 2017-02-08 NOTE — Telephone Encounter (Signed)
Patient requested lab/test results-CT Scan  Pt contact  [phone]

## 2017-02-08 NOTE — Telephone Encounter (Signed)
Results have not been interpreted by margaret.

## 2017-02-14 DIAGNOSIS — H59031 Cystoid macular edema following cataract surgery, right eye: Secondary | ICD-10-CM | POA: Diagnosis not present

## 2017-02-14 DIAGNOSIS — H3021 Posterior cyclitis, right eye: Secondary | ICD-10-CM | POA: Diagnosis not present

## 2017-02-14 DIAGNOSIS — H35033 Hypertensive retinopathy, bilateral: Secondary | ICD-10-CM | POA: Diagnosis not present

## 2017-02-24 ENCOUNTER — Telehealth: Payer: Self-pay | Admitting: Family

## 2017-02-24 NOTE — Telephone Encounter (Signed)
Call pt  Glad she had CT chest and came back benign.  It did show cholesterol build up and also moderate fatty liver disease.   Please advise f/u appt so we can discuss these findings. Not urgent, and likely been there for some time.

## 2017-02-26 NOTE — Telephone Encounter (Signed)
Called patient and was unable to leave VM

## 2017-02-27 ENCOUNTER — Other Ambulatory Visit: Payer: Self-pay | Admitting: Family

## 2017-02-27 NOTE — Telephone Encounter (Signed)
Refilled: 05/2016 by Dr.Cook Last OV: 01/02/17 Last Labs: 01/02/17 Future OV: 04/01/17 Please advise?

## 2017-03-04 NOTE — Telephone Encounter (Signed)
Called patient and was unable to leave VM

## 2017-03-04 NOTE — Telephone Encounter (Signed)
Closing note due to patient not returning call back. 

## 2017-03-06 NOTE — Telephone Encounter (Signed)
Please try again to contact pt-  She had called in prior to my reading results  If cannot reach, may mail letter with results of CT chest along with my note in first note with results.   * be sure to change any short hand prior to mailing*

## 2017-03-07 NOTE — Telephone Encounter (Signed)
Patient was informed of results.  Patient understood and no questions, comments, or concerns at this time.  

## 2017-03-11 DIAGNOSIS — H3021 Posterior cyclitis, right eye: Secondary | ICD-10-CM | POA: Diagnosis not present

## 2017-03-22 ENCOUNTER — Other Ambulatory Visit: Payer: Self-pay | Admitting: Family

## 2017-04-01 ENCOUNTER — Ambulatory Visit: Payer: Self-pay | Admitting: Family

## 2017-04-01 DIAGNOSIS — H348122 Central retinal vein occlusion, left eye, stable: Secondary | ICD-10-CM | POA: Diagnosis not present

## 2017-04-01 DIAGNOSIS — H59031 Cystoid macular edema following cataract surgery, right eye: Secondary | ICD-10-CM | POA: Diagnosis not present

## 2017-04-01 DIAGNOSIS — H43821 Vitreomacular adhesion, right eye: Secondary | ICD-10-CM | POA: Diagnosis not present

## 2017-04-01 DIAGNOSIS — H353131 Nonexudative age-related macular degeneration, bilateral, early dry stage: Secondary | ICD-10-CM | POA: Diagnosis not present

## 2017-04-02 ENCOUNTER — Telehealth: Payer: Self-pay | Admitting: *Deleted

## 2017-04-02 MED ORDER — LOSARTAN POTASSIUM 100 MG PO TABS: 100.0000 mg | ORAL_TABLET | Freq: Every day | ORAL | 0 refills | Status: DC

## 2017-04-02 NOTE — Telephone Encounter (Signed)
[name] requested medication refills for amlodipine and losartan

## 2017-04-02 NOTE — Telephone Encounter (Signed)
Refill faxed on cozaar to [facility] .   Amlodpine has refills at pharmacy.

## 2017-04-10 ENCOUNTER — Other Ambulatory Visit (HOSPITAL_COMMUNITY)
Admission: RE | Admit: 2017-04-10 | Discharge: 2017-04-10 | Disposition: A | Discharge status: Home / Self Care | Payer: PPO | Source: Ambulatory Visit | Attending: Family | Admitting: Family

## 2017-04-10 ENCOUNTER — Encounter: Payer: Self-pay | Admitting: Family

## 2017-04-10 ENCOUNTER — Ambulatory Visit (INDEPENDENT_AMBULATORY_CARE_PROVIDER_SITE_OTHER): Payer: PPO | Admitting: Family

## 2017-04-10 VITALS — BP 144/82 | HR 68 | Temp 98.0°F | Ht 62.25 in | Wt 151.2 lb

## 2017-04-10 DIAGNOSIS — E785 Hyperlipidemia, unspecified: Secondary | ICD-10-CM

## 2017-04-10 DIAGNOSIS — Z124 Encounter for screening for malignant neoplasm of cervix: Secondary | ICD-10-CM | POA: Diagnosis not present

## 2017-04-10 DIAGNOSIS — I10 Essential (primary) hypertension: Secondary | ICD-10-CM

## 2017-04-10 DIAGNOSIS — K7030 Alcoholic cirrhosis of liver without ascites: Secondary | ICD-10-CM | POA: Diagnosis not present

## 2017-04-10 DIAGNOSIS — K7460 Unspecified cirrhosis of liver: Secondary | ICD-10-CM | POA: Insufficient documentation

## 2017-04-10 MED ORDER — LOSARTAN POTASSIUM 100 MG PO TABS: 100.0000 mg | ORAL_TABLET | Freq: Every day | ORAL | 1 refills | Status: DC

## 2017-04-10 MED ORDER — GABAPENTIN 300 MG PO CAPS
300.0000 mg | ORAL_CAPSULE | Freq: Every day | ORAL | 1 refills | Status: DC
Start: 2017-04-10 — End: 2020-02-15

## 2017-04-10 MED ORDER — PRAVASTATIN SODIUM 40 MG PO TABS: 40.0000 mg | ORAL_TABLET | Freq: Every day | ORAL | 2 refills | Status: DC

## 2017-04-10 MED ORDER — THIAMINE HCL 100 MG PO TABS
100.0000 mg | ORAL_TABLET | Freq: Every day | ORAL | 2 refills | Status: DC
Start: 2017-04-10 — End: 2017-08-06

## 2017-04-10 MED ORDER — AMLODIPINE BESYLATE 5 MG PO TABS: 5.0000 mg | ORAL_TABLET | Freq: Every day | ORAL | 1 refills | Status: DC

## 2017-04-10 NOTE — Assessment & Plan Note (Signed)
Stable. Refilled as requested.

## 2017-04-10 NOTE — Assessment & Plan Note (Signed)
Slightly elevated today. Will increase Norvasc. Follow-up in 3 months.

## 2017-04-10 NOTE — Progress Notes (Signed)
Pre visit review using our clinic review tool, if applicable. No additional management support is needed unless otherwise documented below in the visit note. 

## 2017-04-10 NOTE — Patient Instructions (Signed)
Increase norvasc to 5mg  daily  Referral to GI  Please ensure you keep appt with urology due to cyst seen on kidney  Follow up 3-6 months.

## 2017-04-10 NOTE — Assessment & Plan Note (Signed)
Chronic. 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 Referral placed.

## 2017-04-10 NOTE — Progress Notes (Signed)
Subjective:    Patient ID: Heather Rodgers, female    DOB: [birth year], 71 y.o.   MRN: [id number]  CC: [name] is a 72 y.o. female who presents today for follow up.   HPI: Follow  up  HTN- compliant with medication. SBP 140's at home.  Denies exertional chest pain or pressure, numbness or tingling radiating to left arm or jaw, palpitations, dizziness, frequent headaches, changes in vision, or shortness of breath.    Cirrhosis- has never seen GI ; sober one year.    CT chest shows renal cyst- has an appointment with urologist next month.   Would like Pap smear today as was not done during routine physical per patient preference.     HISTORY:  Past Medical History:  Diagnosis Date  . Alcohol abuse 2017   rehab 11/2015  . Anemia   . Arthritis   . Colitis 2017   c.difficile  . Hypertension   . Kidney stones    Past Surgical History:  Procedure Laterality Date  . NO PAST SURGERIES     Family History  Problem Relation Age of Onset  . Heart disease Mother   . Hypertension Mother   . Arthritis Father   . Heart disease Father   . Hypertension Father     Allergies: Penicillins Current Outpatient Prescriptions on File Prior to Visit  Medication Sig Dispense Refill  . aspirin EC 81 MG tablet Take 1 tablet by mouth daily.    . Cholecalciferol (D 5000) 5000 UNITS TABS Take 1 tablet by mouth daily.     No current facility-administered medications on file prior to visit.     Social History  Substance Use Topics  . Smoking status: Former Smoker    Packs/day: 1.00    Years: 47.00    Types: Cigarettes    Start date: 09/19/1995    Quit date: 12/01/2015  . Smokeless tobacco: Never Used     Comment: Has patches  . Alcohol use No     Comment: sober since late December 2016    Review of Systems  Constitutional: Negative for chills and fever.  Respiratory: Negative for cough.   Cardiovascular: Negative for chest pain and palpitations.    Gastrointestinal: Negative for abdominal distention, nausea and vomiting.  Genitourinary: Negative for vaginal bleeding, vaginal discharge and vaginal pain.      Objective:    BP (!) 144/82   Pulse 68   Temp 98 F (36.7 C) (Oral)   Ht 5' 2.25" (1.581 m)   Wt 151 lb 3.2 oz (68.6 kg)   SpO2 96%   BMI 27.43 kg/m  BP Readings from Last 3 Encounters:  04/10/17 (!) 144/82  01/02/17 (!) 150/68  08/24/16 132/66   Wt Readings from Last 3 Encounters:  04/10/17 151 lb 3.2 oz (68.6 kg)  01/29/17 150 lb (68 kg)  01/02/17 154 lb 3.2 oz (69.9 kg)    Physical Exam  Constitutional: She appears well-developed and well-nourished.  Eyes: Conjunctivae are normal.  Cardiovascular: Normal rate, regular rhythm, normal heart sounds and normal pulses.   Pulmonary/Chest: Effort normal and breath sounds normal. She has no wheezes. She has no rhonchi. She has no rales.  Genitourinary: Uterus is not enlarged. Cervix exhibits no motion tenderness. Right adnexum displays no mass, no tenderness and no fullness. Left adnexum displays no mass, no tenderness and no fullness. There is bleeding in the vagina. No erythema or tenderness in the vagina. No foreign body in the  vagina. No vaginal discharge found.  Genitourinary Comments: Pap performed.   Neurological: She is alert.  Skin: Skin is warm and dry.  Psychiatric: She has a normal mood and affect. Her speech is normal and behavior is normal. Thought content normal.  Vitals reviewed.      Assessment & Plan:   Problem List Items Addressed This Visit      Cardiovascular and Mediastinum   HTN (hypertension)    Slightly elevated today. Will increase Norvasc. Follow-up in 3 months.      Relevant Medications   amLODipine (NORVASC) 5 MG tablet   losartan (COZAAR) 100 MG tablet   pravastatin (PRAVACHOL) 40 MG tablet   thiamine 100 MG tablet   gabapentin (NEURONTIN) 300 MG capsule     Digestive   Cirrhosis (HCC)    Chronic. Suspect from prior alcohol  use. Patient has been sober for one year however has never seen GI. Referral placed.      Relevant Orders   Ambulatory referral to Gastroenterology     Other   HLD (hyperlipidemia)   Relevant Medications   amLODipine (NORVASC) 5 MG tablet   losartan (COZAAR) 100 MG tablet   pravastatin (PRAVACHOL) 40 MG tablet   Encounter for Papanicolaou smear of cervix - Primary    Pap smear performed.       Relevant Orders   Cytology - PAP       I have discontinued Ms. Dutt's folic acid. I have also changed her amLODipine. Additionally, I am having her maintain her aspirin EC, Cholecalciferol, losartan, pravastatin, thiamine, and gabapentin.   Meds ordered this encounter  Medications  . amLODipine (NORVASC) 5 MG tablet    Sig: Take 1 tablet (5 mg total) by mouth daily.    Dispense:  90 tablet    Refill:  1  . losartan (COZAAR) 100 MG tablet    Sig: Take 1 tablet (100 mg total) by mouth daily.    Dispense:  90 tablet    Refill:  1  . pravastatin (PRAVACHOL) 40 MG tablet    Sig: Take 1 tablet (40 mg total) by mouth daily.    Dispense:  90 tablet    Refill:  2  . thiamine 100 MG tablet    Sig: Take 1 tablet (100 mg total) by mouth daily.    Dispense:  90 tablet    Refill:  2  . gabapentin (NEURONTIN) 300 MG capsule    Sig: Take 1 capsule (300 mg total) by mouth at bedtime.    Dispense:  90 capsule    Refill:  1    Return precautions given.   Risks, benefits, and alternatives of the medications and treatment plan prescribed today were discussed, and patient expressed understanding.   Education regarding symptom management and diagnosis given to patient on AVS.  Continue to follow with Allegra GranaArnett, Zephaniah Lubrano G, FNP for routine health maintenance.   Heather StackBrenda Joyce Wright Killmer and I agreed with plan.   Rennie PlowmanMargaret Lyliana Dicenso, FNP

## 2017-04-10 NOTE — Assessment & Plan Note (Signed)
Pap smear performed

## 2017-04-11 LAB — CYTOLOGY - PAP
Diagnosis: NEGATIVE
HPV (WINDOPATH): NOT DETECTED

## 2017-04-15 ENCOUNTER — Encounter: Payer: Self-pay | Admitting: Gastroenterology

## 2017-04-15 ENCOUNTER — Encounter: Payer: Self-pay | Admitting: *Deleted

## 2017-05-01 DIAGNOSIS — I10 Essential (primary) hypertension: Secondary | ICD-10-CM | POA: Diagnosis not present

## 2017-05-01 DIAGNOSIS — R809 Proteinuria, unspecified: Secondary | ICD-10-CM | POA: Diagnosis not present

## 2017-05-27 DIAGNOSIS — H59031 Cystoid macular edema following cataract surgery, right eye: Secondary | ICD-10-CM | POA: Diagnosis not present

## 2017-05-27 DIAGNOSIS — H353131 Nonexudative age-related macular degeneration, bilateral, early dry stage: Secondary | ICD-10-CM | POA: Diagnosis not present

## 2017-05-27 DIAGNOSIS — H34812 Central retinal vein occlusion, left eye, with macular edema: Secondary | ICD-10-CM | POA: Diagnosis not present

## 2017-05-27 DIAGNOSIS — H43821 Vitreomacular adhesion, right eye: Secondary | ICD-10-CM | POA: Diagnosis not present

## 2017-06-03 DIAGNOSIS — R809 Proteinuria, unspecified: Secondary | ICD-10-CM | POA: Diagnosis not present

## 2017-06-03 DIAGNOSIS — I10 Essential (primary) hypertension: Secondary | ICD-10-CM | POA: Diagnosis not present

## 2017-07-01 DIAGNOSIS — H34812 Central retinal vein occlusion, left eye, with macular edema: Secondary | ICD-10-CM | POA: Diagnosis not present

## 2017-07-01 DIAGNOSIS — H35033 Hypertensive retinopathy, bilateral: Secondary | ICD-10-CM | POA: Diagnosis not present

## 2017-07-01 DIAGNOSIS — H43821 Vitreomacular adhesion, right eye: Secondary | ICD-10-CM | POA: Diagnosis not present

## 2017-07-01 DIAGNOSIS — H31091 Other chorioretinal scars, right eye: Secondary | ICD-10-CM | POA: Diagnosis not present

## 2017-07-09 ENCOUNTER — Ambulatory Visit: Payer: Self-pay | Admitting: Family

## 2017-07-16 ENCOUNTER — Ambulatory Visit: Payer: Self-pay | Admitting: Family

## 2017-08-04 IMAGING — MR MR BRAIN/IAC WO/W
8 of 11 series · 35 of 48 positions shown · IV contrast (multihance)
Comparison: None.

CLINICAL DATA: 69-year-old female with dizziness and bilateral
hearing loss for 2 months. Left hand numbness. Initial encounter.

EXAM:
MRI HEAD WITHOUT AND WITH CONTRAST
TECHNIQUE: Multiplanar, multiecho pulse sequences of the brain and surrounding
structures were obtained without and with intravenous contrast.
CONTRAST:  13mL MULTIHANCE GADOBENATE DIMEGLUMINE 529 MG/ML IV SOLN

[Series 2: T1 · sagittal · 5.0mm · 0.45mm/px · 3 of 27 slices shown (1 of 3)]
[im 1/27]
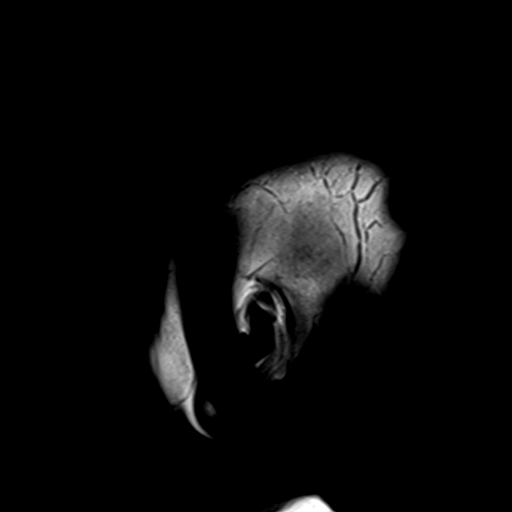
[im 14/27]
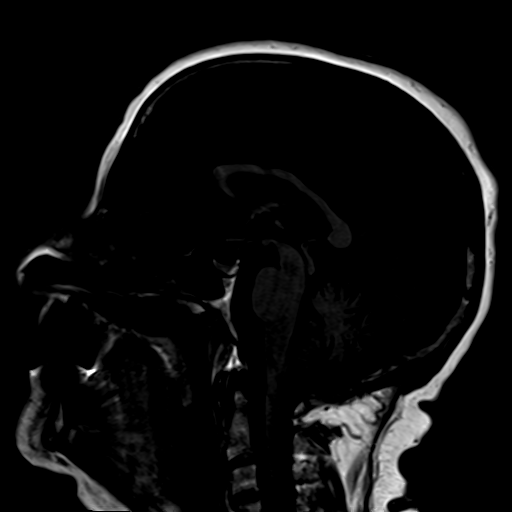
[im 27/27]
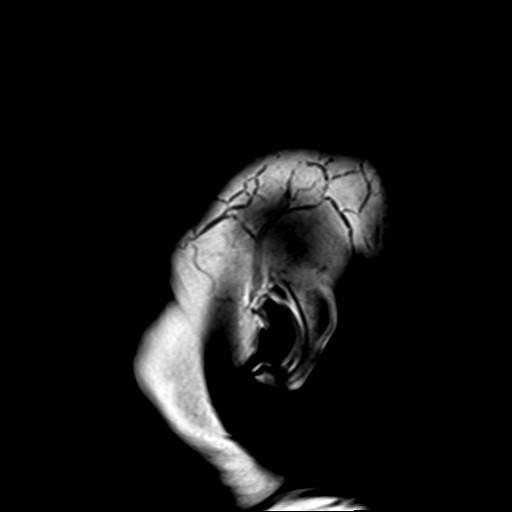

[Series 4: DWI · axial · 3.0mm · 1.80mm/px · z∈[-75,+84]mm · 7 of 42 slices shown (1 of 2)]
[im 1/42]
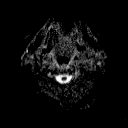
[im 7/42]
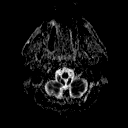
[im 14/42]
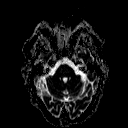
[im 21/42]
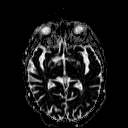
[im 28/42]
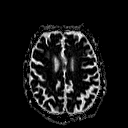
[im 35/42]
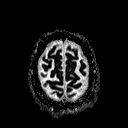
[im 42/42]
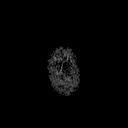

[Series 5: T2 · axial · 5.0mm · 0.60mm/px · z∈[-78,+87]mm · 4 of 27 slices shown (1 of 2)]
[im 1/27]
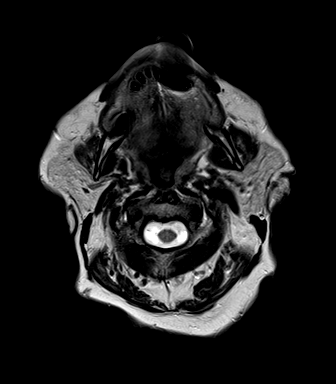
[im 9/27]
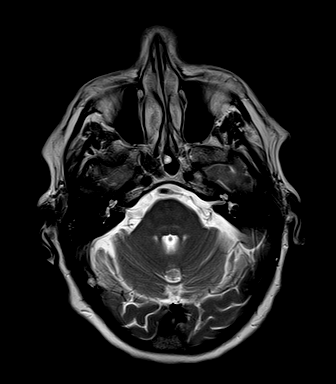
[im 18/27]
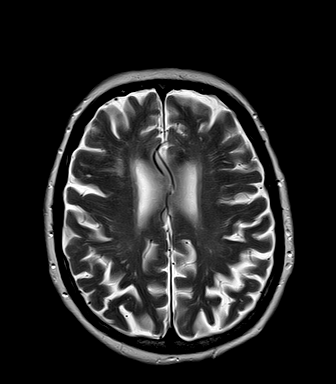
[im 27/27]
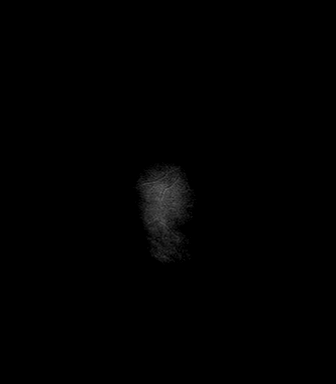

[Series 6: FLAIR · axial · 5.0mm · 0.45mm/px · z∈[-78,+87]mm · 4 of 27 slices shown]
[im 1/27]
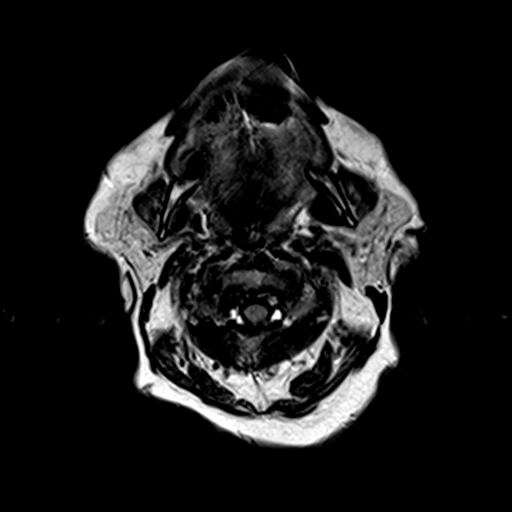
[im 9/27]
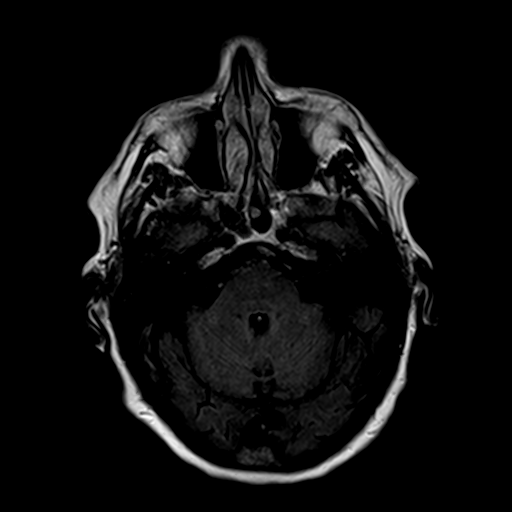
[im 18/27]
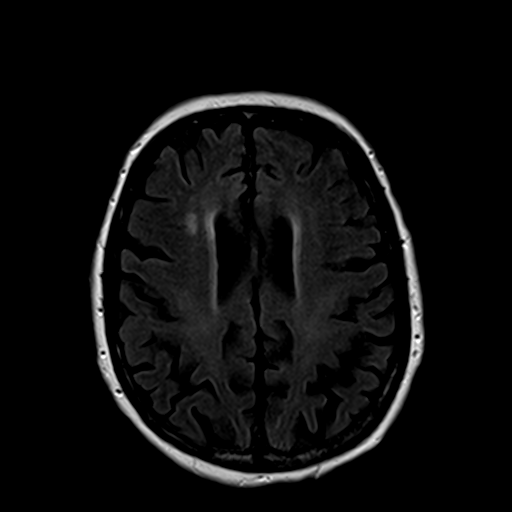
[im 27/27]
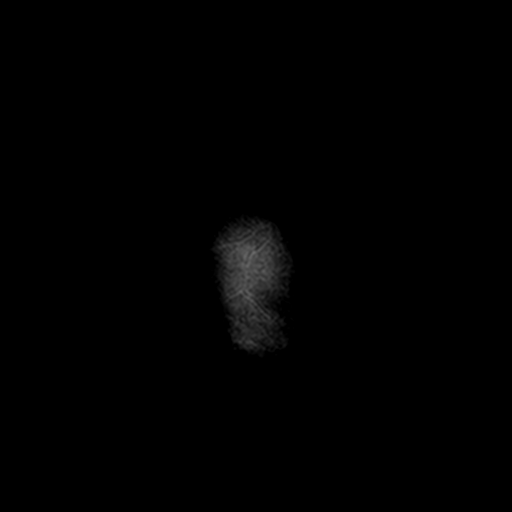

[Series 7: T1 · coronal · 3.0mm · 0.78mm/px · 2 of 11 slices shown (2 of 3)]
[im 1/11]
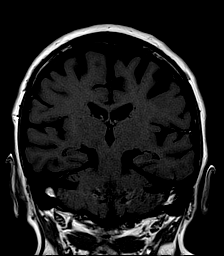
[im 11/11]
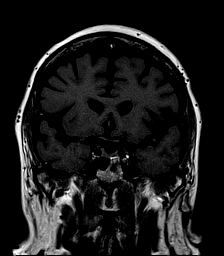

[Series 8: T2 · axial · 1.0mm · 0.35mm/px · z∈[-71,-32]mm · 6 of 40 slices shown (2 of 2)]
[im 1/40]
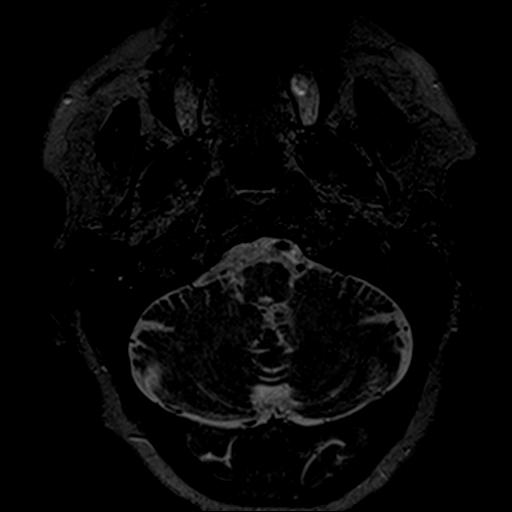
[im 8/40]
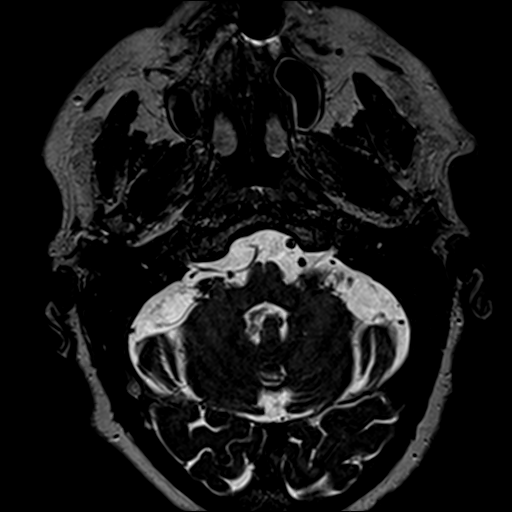
[im 16/40]
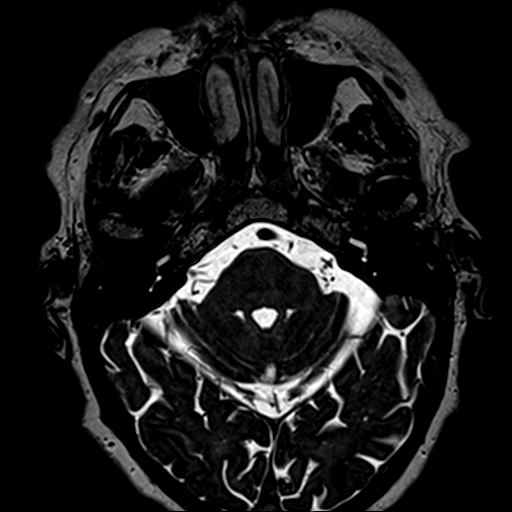
[im 24/40]
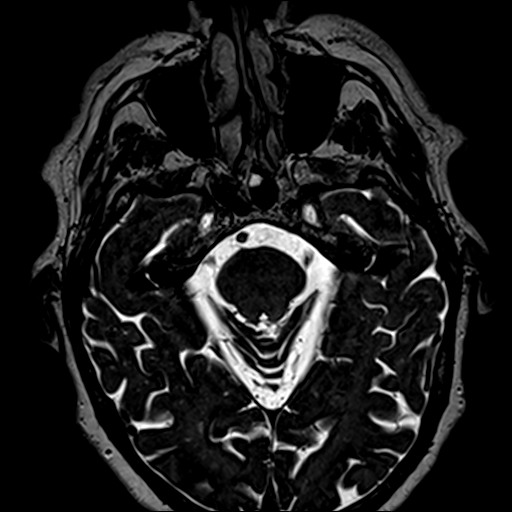
[im 32/40]
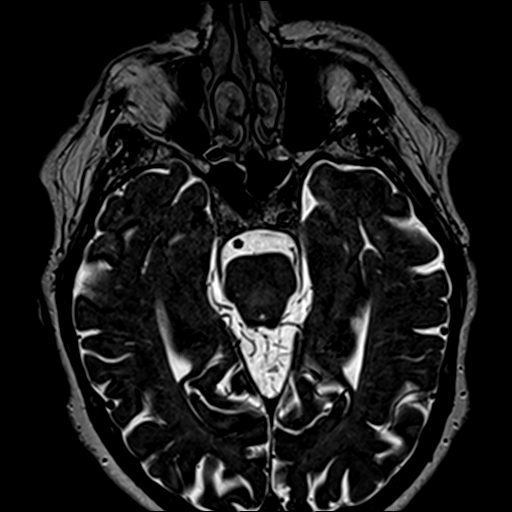
[im 40/40]
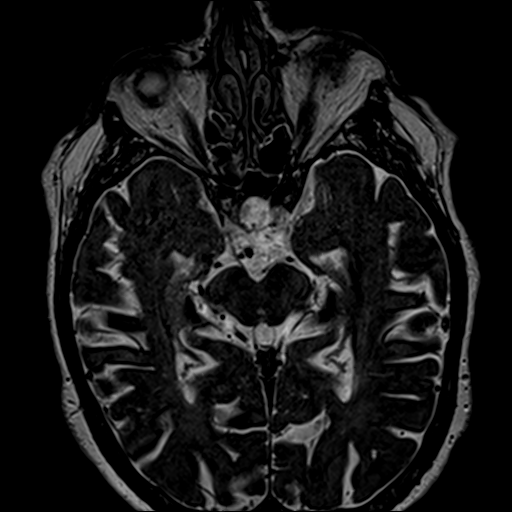

[Series 9: T1 · axial · 3.0mm · 0.39mm/px · z∈[-69,-33]mm · 2 of 12 slices shown (3 of 3)]
[im 1/12]
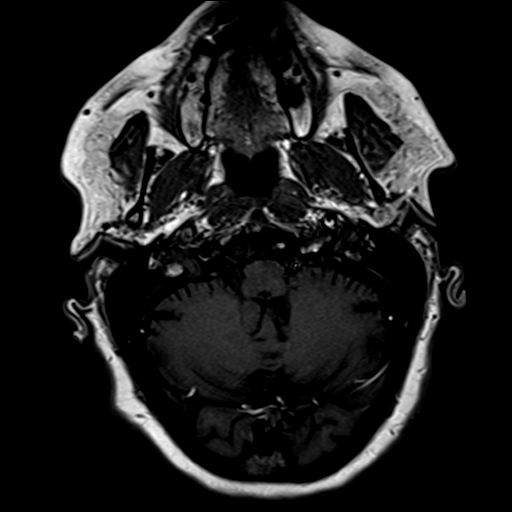
[im 12/12]
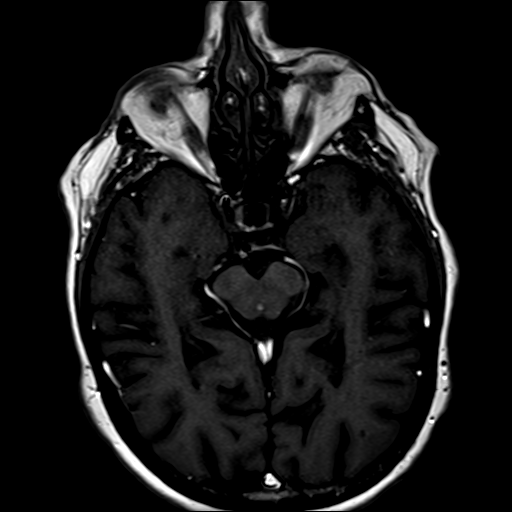

[Series 100: DWI · axial · 3.0mm · 1.80mm/px · z∈[-71,+84]mm · 7 of 42 slices shown (2 of 2)]
[im 1/42]
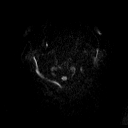
[im 7/42]
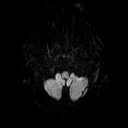
[im 14/42]
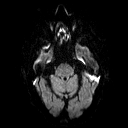
[im 21/42]
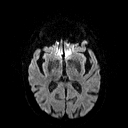
[im 28/42]
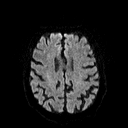
[im 35/42]
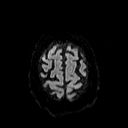
[im 42/42]
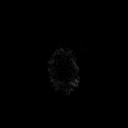

[35 of 48 positions shown; findings below may reference images not displayed]

FINDINGS: Cerebral volume is within normal limits for age. No restricted
diffusion to suggest acute infarction. No midline shift, mass
effect, evidence of mass lesion, ventriculomegaly, extra-axial
collection or acute intracranial hemorrhage. Cervicomedullary
junction and pituitary are within normal limits. Major intracranial
vascular flow voids are within normal limits.

Minimal to mild for age scattered cerebral white matter T2 and FLAIR
hyperintensity. T2 and FLAIR hyperintense focus in the left
lentiform nuclei which most resembles a chronic lacunar (series 6,
image 16). No cortical encephalomalacia. Otherwise the deep gray
matter nuclei, brainstem, and cerebellum are within normal limits.
No abnormal enhancement identified.

Negative visualized cervical spine. Visualized bone marrow signal is
within normal limits. Negative orbit and scalp soft tissues. Mild
bilateral paranasal sinus mucosal thickening.

Dedicated internal auditory canal imaging. Cerebellopontine angles
are within normal limits. Visible internal auditory structures
appear normal. Preserved T2 signal in the bilateral cochlea and
vestibular structures. Trace right mastoid fluid. Normal
stylomastoid foramina. No abnormal enhancement identified. Negative
visualized parotid glands. No skullbase abnormality identified.
IMPRESSION: 1. Negative IAC imaging.
2. Minimal to mild signal changes in the brain compatible with
chronic small vessel disease.

## 2017-08-05 DIAGNOSIS — H524 Presbyopia: Secondary | ICD-10-CM | POA: Diagnosis not present

## 2017-08-05 DIAGNOSIS — Z961 Presence of intraocular lens: Secondary | ICD-10-CM | POA: Diagnosis not present

## 2017-08-05 DIAGNOSIS — H52223 Regular astigmatism, bilateral: Secondary | ICD-10-CM | POA: Diagnosis not present

## 2017-08-05 DIAGNOSIS — H5203 Hypermetropia, bilateral: Secondary | ICD-10-CM | POA: Diagnosis not present

## 2017-08-05 DIAGNOSIS — H40033 Anatomical narrow angle, bilateral: Secondary | ICD-10-CM | POA: Diagnosis not present

## 2017-08-05 DIAGNOSIS — H59033 Cystoid macular edema following cataract surgery, bilateral: Secondary | ICD-10-CM | POA: Diagnosis not present

## 2017-08-06 ENCOUNTER — Ambulatory Visit (INDEPENDENT_AMBULATORY_CARE_PROVIDER_SITE_OTHER): Payer: PPO

## 2017-08-06 VITALS — BP 122/62 | HR 62 | Temp 97.9°F | Resp 14 | Ht 62.25 in | Wt 147.8 lb

## 2017-08-06 DIAGNOSIS — E2839 Other primary ovarian failure: Secondary | ICD-10-CM

## 2017-08-06 DIAGNOSIS — Z1231 Encounter for screening mammogram for malignant neoplasm of breast: Secondary | ICD-10-CM

## 2017-08-06 DIAGNOSIS — Z1239 Encounter for other screening for malignant neoplasm of breast: Secondary | ICD-10-CM

## 2017-08-06 DIAGNOSIS — Z0000 Encounter for general adult medical examination without abnormal findings: Secondary | ICD-10-CM

## 2017-08-06 DIAGNOSIS — Z961 Presence of intraocular lens: Secondary | ICD-10-CM | POA: Diagnosis not present

## 2017-08-06 NOTE — Progress Notes (Signed)
Subjective:   Heather Rodgers is a 71 y.o. female who presents for an Initial Medicare Annual Wellness Visit.  Review of Systems    No ROS.  Medicare Wellness Visit. Additional risk factors are reflected in the social history.  Cardiac Risk Factors include: advanced age (>5755men, 46>65 women);hypertension;smoking/ tobacco exposure     Objective:    Today's Vitals   08/06/17 0834  BP: 122/62  Pulse: 62  Resp: 14  Temp: 97.9 F (36.6 C)  TempSrc: Oral  SpO2: 98%  Weight: 147 lb 12.8 oz (67 kg)  Height: 5' 2.25" (1.581 m)   Body mass index is 26.82 kg/m.   Current Medications (verified) Outpatient Encounter Prescriptions as of 08/06/2017  Medication Sig  . amLODipine (NORVASC) 5 MG tablet Take 1 tablet (5 mg total) by mouth daily.  Marland Kitchen. aspirin EC 81 MG tablet Take 1 tablet by mouth daily.  . Cholecalciferol (D 5000) 5000 UNITS TABS Take 1 tablet by mouth daily.  Marland Kitchen. gabapentin (NEURONTIN) 300 MG capsule Take 1 capsule (300 mg total) by mouth at bedtime.  . Glucosamine-MSM-Hyaluronic Acd (JOINT HEALTH PO) Take 1 tablet by mouth.  . losartan (COZAAR) 100 MG tablet Take 1 tablet (100 mg total) by mouth daily.  . pravastatin (PRAVACHOL) 40 MG tablet Take 1 tablet (40 mg total) by mouth daily.  . [DISCONTINUED] thiamine 100 MG tablet Take 1 tablet (100 mg total) by mouth daily.   No facility-administered encounter medications on file as of 08/06/2017.     Allergies (verified) Penicillins   History: Past Medical History:  Diagnosis Date  . Alcohol abuse 2017   rehab 11/2015  . Anemia   . Arthritis   . Colitis 2017   c.difficile  . Hypertension   . Kidney stones    Past Surgical History:  Procedure Laterality Date  . NO PAST SURGERIES     Family History  Problem Relation Age of Onset  . Heart disease Mother   . Hypertension Mother   . Arthritis Father   . Heart disease Father   . Hypertension Father    Social History   Occupational History  . Not on  file.   Social History Main Topics  . Smoking status: Current Every Day Smoker    Packs/day: 1.50    Years: 47.00    Types: Cigarettes    Start date: 09/19/1995    Last attempt to quit: 12/01/2015  . Smokeless tobacco: Never Used  . Alcohol use No     Comment: sober since late December 2016  . Drug use: No  . Sexual activity: No    Tobacco Counseling Ready to quit: Yes Counseling given: Yes   Activities of Daily Living In your present state of health, do you have any difficulty performing the following activities: 08/06/2017  Hearing? N  Vision? N  Difficulty concentrating or making decisions? N  Walking or climbing stairs? N  Dressing or bathing? N  Doing errands, shopping? N  Preparing Food and eating ? N  Using the Toilet? N  In the past six months, have you accidently leaked urine? N  Do you have problems with loss of bowel control? N  Managing your Medications? N  Managing your Finances? N  Housekeeping or managing your Housekeeping? N  Some recent data might be hidden    Immunizations and Health Maintenance Immunization History  Administered Date(s) Administered  . Influenza, High Dose Seasonal PF 08/22/2016  . Influenza-Unspecified 09/21/2015  . Pneumococcal Conjugate-13 01/02/2017  .  Pneumococcal-Unspecified 04/05/2014  . Td 05/10/2014   Health Maintenance Due  Topic Date Due  . DEXA SCAN  03/07/2013  . MAMMOGRAM  03/01/2016    Patient Care Team: Allegra Grana, FNP as PCP - General (Family Medicine)  Indicate any recent Medical Services you may have received from other than [facility] providers in the past year (date may be approximate).     Assessment:   This is a routine wellness examination for Heather Rodgers. The goal of the wellness visit is to assist the patient how to close the gaps in care and create a preventative care plan for the patient.   The roster of all physicians providing medical care to patient is listed in the Snapshot section of the  chart.  Taking calcium VIT D as appropriate/Osteoporosis risk reviewed.    Safety issues reviewed; Smoke and carbon monoxide detectors in the home. No firearms in the home.  Wears seatbelts when driving or riding with others. Patient does wear sunscreen or protective clothing when in direct sunlight. No violence in the home.  Patient is alert, normal appearance, oriented to person/place/and time.  Correctly identified the president of the [place], recall of 3/3 words, and performing simple calculations. Displays appropriate judgement and can read correct time from watch face.   No new identified risk were noted.  No failures at ADL's or IADL's.   BMI- discussed the importance of a healthy diet, water intake and the benefits of aerobic exercise. Educational material provided.   24 hour diet recall: Breakfast: cereal Lunch: grilled chicken salad Dinner: peanut butter nabs Snack: fudge pop  Daily fluid intake: 2 cups of caffeine, 8 cups of water  Sleep patterns- wakes every 2 hours and goes back to sleep without issues.  Dexa Scan and Mammogram ordered; follow as directed.  Educational material provided.  Patient Concerns: Stop smoking.  No longer using the patches.  Deferred to PCP for follow up.  Hearing/Vision screen Hearing Screening Comments: Patient is able to hear conversational tones without difficulty.  No issues reported.   Vision Screening Comments: Followed by Dr. Larence Penning Wears corrective lenses when reading Last OV 07/2017 Cataract extraction, bilateral Visual acuity not assessed per patient preference since they have regular follow up with the ophthalmologist  Dietary issues and exercise activities discussed: Current Exercise Habits: Home exercise routine, Type of exercise: treadmill, Time (Minutes): 45, Frequency (Times/Week): 6, Weekly Exercise (Minutes/Week): 270, Intensity: Moderate  Goals    . Quit smoking / using tobacco      Depression Screen PHQ 2/9 Scores  08/06/2017 01/02/2017 08/24/2016 11/16/2015  PHQ - 2 Score 0 0 0 1    Fall Risk Fall Risk  08/06/2017 01/02/2017 08/24/2016 11/16/2015  Falls in the past year? No No No Yes  Number falls in past yr: - - - 2 or more  Injury with Fall? - - - No  Risk for fall due to : - - - History of fall(s)    Cognitive Function: MMSE - Mini Mental State Exam 08/06/2017  Orientation to time 5  Orientation to Place 5  Registration 3  Attention/ Calculation 5  Recall 3  Language- name 2 objects 2  Language- repeat 1  Language- follow 3 step command 3  Language- read & follow direction 1  Write a sentence 1  Copy design 1  Total score 30        Screening Tests Health Maintenance  Topic Date Due  . DEXA SCAN  03/07/2013  . MAMMOGRAM  03/01/2016  .  TETANUS/TDAP  05/10/2024  . COLONOSCOPY  07/12/2024  . Hepatitis C Screening  Completed  . PNA vac Low Risk Adult  Completed      Plan:    End of life planning; Advance aging; Advanced directives discussed. Copy of current HCPOA/Living Will requested.    I have personally reviewed and noted the following in the patient's chart:   . Medical and social history . Use of alcohol, tobacco or illicit drugs  . Current medications and supplements . Functional ability and status . Nutritional status . Physical activity . Advanced directives . List of other physicians . Hospitalizations, surgeries, and ER visits in previous 12 months . Vitals . Screenings to include cognitive, depression, and falls . Referrals and appointments  In addition, I have reviewed and discussed with patient certain preventive protocols, quality metrics, and best practice recommendations. A written personalized care plan for preventive services as well as general preventive health recommendations were provided to patient.     Ashok Pall, LPN   1/61/0960    Agree with plan. [name], NP

## 2017-08-06 NOTE — Patient Instructions (Addendum)
Heather Rodgers , Thank you for taking time to come for your Medicare Wellness Visit. I appreciate your ongoing commitment to your health goals. Please review the following plan we discussed and let me know if I can assist you in the future.   Follow up with Heather Rodgers as needed.    Bring a copy of your Health Care Power of Attorney and/or Living Will to be scanned into chart.  Dexa Scan and Mammogram ordered; follow as directed.  Have a great day!  These are the goals we discussed: Goals    . Quit smoking / using tobacco       This is a list of the screening recommended for you and due dates:  Health Maintenance  Topic Date Due  . DEXA scan (bone density measurement)  03/07/2013  . Mammogram  03/01/2016  . Tetanus Vaccine  05/10/2024  . Colon Cancer Screening  07/12/2024  .  Hepatitis C: One time screening is recommended by Center for Disease Control  (CDC) for  adults born from [birth year] through 1963.   Completed  . Pneumonia vaccines  Completed     Bone Densitometry Bone densitometry is an imaging test that uses a special X-ray to measure the amount of calcium and other minerals in your bones (bone density). This test is also known as a bone mineral density test or dual-energy X-ray absorptiometry (DXA). The test can measure bone density at your hip and your spine. It is similar to having a regular X-ray. You may have this test to:  Diagnose a condition that causes weak or thin bones (osteoporosis).  Predict your risk of a broken bone (fracture).  Determine how well osteoporosis treatment is working.  Tell a health care provider about:  Any allergies you have.  All medicines you are taking, including vitamins, herbs, eye drops, creams, and over-the-counter medicines.  Any problems you or family members have had with anesthetic medicines.  Any blood disorders you have.  Any surgeries you have had.  Any medical conditions you have.  Possibility of pregnancy.  Any  other medical test you had within the previous 14 days that used contrast material. What are the risks? Generally, this is a safe procedure. However, problems can occur and may include the following:  This test exposes you to a very small amount of radiation.  The risks of radiation exposure may be greater to unborn children.  What happens before the procedure?  Do not take any calcium supplements for 24 hours before having the test. You can otherwise eat and drink what you usually do.  Take off all metal jewelry, eyeglasses, dental appliances, and any other metal objects. What happens during the procedure?  You may lie on an exam table. There will be an X-ray generator below you and an imaging device above you.  Other devices, such as boxes or braces, may be used to position your body properly for the scan.  You will need to lie still while the machine slowly scans your body.  The images will show up on a computer monitor. What happens after the procedure? You may need more testing at a later time. This information is not intended to replace advice given to you by your health care provider. Make sure you discuss any questions you have with your health care provider. Document Released: 12/04/2004 Document Revised: 04/19/2016 Document Reviewed: 04/22/2014 Elsevier Interactive Patient Education  2018 [employer].   Mammogram A mammogram is an X-ray of the breasts that is  done to check for abnormal changes. This procedure can screen for and detect any changes that may suggest breast cancer. A mammogram can also identify other changes and variations in the breast, such as:  Inflammation of the breast tissue (mastitis).  An infected area that contains a collection of pus (abscess).  A fluid-filled sac (cyst).  Fibrocystic changes. This is when breast tissue becomes denser, which can make the tissue feel rope-like or uneven under the skin.  Tumors that are not cancerous  (benign).  Tell a health care provider about:  Any allergies you have.  If you have breast implants.  If you have had previous breast disease, biopsy, or surgery.  If you are breastfeeding.  Any possibility that you could be pregnant, if this applies.  If you are younger than age 69.  If you have a family history of breast cancer. What are the risks? Generally, this is a safe procedure. However, problems may occur, including:  Exposure to radiation. Radiation levels are very low with this test.  The results being misinterpreted.  The need for further tests.  The inability of the mammogram to detect certain cancers.  What happens before the procedure?  Schedule your test about 1-2 weeks after your menstrual period. This is usually when your breasts are the least tender.  If you have had a mammogram done at a different facility in the past, get the mammogram X-rays or have them sent to your current exam facility in order to compare them.  Wash your breasts and under your arms the day of the test.  Do not wear deodorants, perfumes, lotions, or powders anywhere on your body on the day of the test.  Remove any jewelry from your neck.  Wear clothes that you can change into and out of easily. What happens during the procedure?  You will undress from the waist up and put on a gown.  You will stand in front of the X-ray machine.  Each breast will be placed between two plastic or glass plates. The plates will compress your breast for a few seconds. Try to stay as relaxed as possible during the procedure. This does not cause any harm to your breasts and any discomfort you feel will be very brief.  X-rays will be taken from different angles of each breast. The procedure may vary among health care providers and hospitals. What happens after the procedure?  The mammogram will be examined by a specialist (radiologist).  You may need to repeat certain parts of the test,  depending on the quality of the images. This is commonly done if the radiologist needs a better view of the breast tissue.  Ask when your test results will be ready. Make sure you get your test results.  You may resume your normal activities. This information is not intended to replace advice given to you by your health care provider. Make sure you discuss any questions you have with your health care provider. Document Released: 11/09/2000 Document Revised: 04/16/2016 Document Reviewed: 01/21/2015 Elsevier Interactive Patient Education  2017 [employer]   Steps to Quit Smoking Smoking tobacco can be harmful to your health and can affect almost every organ in your body. Smoking puts you, and those around you, at risk for developing many serious chronic diseases. Quitting smoking is difficult, but it is one of the best things that you can do for your health. It is never too late to quit. What are the benefits of quitting smoking? When you quit  smoking, you lower your risk of developing serious diseases and conditions, such as:  Lung cancer or lung disease, such as COPD.  Heart disease.  Stroke.  Heart attack.  Infertility.  Osteoporosis and bone fractures.  Additionally, symptoms such as coughing, wheezing, and shortness of breath may get better when you quit. You may also find that you get sick less often because your body is stronger at fighting off colds and infections. If you are pregnant, quitting smoking can help to reduce your chances of having a baby of low birth weight. How do I get ready to quit? When you decide to quit smoking, create a plan to make sure that you are successful. Before you quit:  Pick a date to quit. Set a date within the next two weeks to give you time to prepare.  Write down the reasons why you are quitting. Keep this list in places where you will see it often, such as on your bathroom mirror or in your car or wallet.  Identify the people, places,  things, and activities that make you want to smoke (triggers) and avoid them. Make sure to take these actions: ? Throw away all cigarettes at home, at work, and in your car. ? Throw away smoking accessories, such as Set designer. ? Clean your car and make sure to empty the ashtray. ? Clean your home, including curtains and carpets.  Tell your family, friends, and coworkers that you are quitting. Support from your loved ones can make quitting easier.  Talk with your health care provider about your options for quitting smoking.  Find out what treatment options are covered by your health insurance.  What strategies can I use to quit smoking? Talk with your healthcare provider about different strategies to quit smoking. Some strategies include:  Quitting smoking altogether instead of gradually lessening how much you smoke over a period of time. Research shows that quitting "cold [place]" is more successful than gradually quitting.  Attending in-person counseling to help you build problem-solving skills. You are more likely to have success in quitting if you attend several counseling sessions. Even short sessions of 10 minutes can be effective.  Finding resources and support systems that can help you to quit smoking and remain smoke-free after you quit. These resources are most helpful when you use them often. They can include: ? Online chats with a Veterinary surgeon. ? Telephone quitlines. ? Automotive engineer. ? Support groups or group counseling. ? Text messaging programs. ? Mobile phone applications.  Taking medicines to help you quit smoking. (If you are pregnant or breastfeeding, talk with your health care provider first.) Some medicines contain nicotine and some do not. Both types of medicines help with cravings, but the medicines that include nicotine help to relieve withdrawal symptoms. Your health care provider may recommend: ? Nicotine patches, gum, or lozenges. ? Nicotine  inhalers or sprays. ? Non-nicotine medicine that is taken by mouth.  Talk with your health care provider about combining strategies, such as taking medicines while you are also receiving in-person counseling. Using these two strategies together makes you more likely to succeed in quitting than if you used either strategy on its own. If you are pregnant or breastfeeding, talk with your health care provider about finding counseling or other support strategies to quit smoking. Do not take medicine to help you quit smoking unless told to do so by your health care provider. What things can I do to make it easier to quit? Quitting smoking  might feel overwhelming at first, but there is a lot that you can do to make it easier. Take these important actions:  Reach out to your family and friends and ask that they support and encourage you during this time. Call telephone quitlines, reach out to support groups, or work with a counselor for support.  Ask people who smoke to avoid smoking around you.  Avoid places that trigger you to smoke, such as bars, parties, or smoke-break areas at work.  Spend time around people who do not smoke.  Lessen stress in your life, because stress can be a smoking trigger for some people. To lessen stress, try: ? Exercising regularly. ? Deep-breathing exercises. ? Yoga. ? Meditating. ? Performing a body scan. This involves closing your eyes, scanning your body from head to toe, and noticing which parts of your body are particularly tense. Purposefully relax the muscles in those areas.  Download or purchase mobile phone or tablet apps (applications) that can help you stick to your quit plan by providing reminders, tips, and encouragement. There are many free apps, such as QuitGuide from the [employer] developer for Disease Control and Prevention). You can find other support for quitting smoking (smoking cessation) through smokefree.gov and other websites.  How will I feel when I  quit smoking? Within the first 24 hours of quitting smoking, you may start to feel some withdrawal symptoms. These symptoms are usually most noticeable 2-3 days after quitting, but they usually do not last beyond 2-3 weeks. Changes or symptoms that you might experience include:  Mood swings.  Restlessness, anxiety, or irritation.  Difficulty concentrating.  Dizziness.  Strong cravings for sugary foods in addition to nicotine.  Mild weight gain.  Constipation.  Nausea.  Coughing or a sore throat.  Changes in how your medicines work in your body.  A depressed mood.  Difficulty sleeping (insomnia).  After the first 2-3 weeks of quitting, you may start to notice more positive results, such as:  Improved sense of smell and taste.  Decreased coughing and sore throat.  Slower heart rate.  Lower blood pressure.  Clearer skin.  The ability to breathe more easily.  Fewer sick days.  Quitting smoking is very challenging for most people. Do not get discouraged if you are not successful the first time. Some people need to make many attempts to quit before they achieve long-term success. Do your best to stick to your quit plan, and talk with your health care provider if you have any questions or concerns. This information is not intended to replace advice given to you by your health care provider. Make sure you discuss any questions you have with your health care provider. Document Released: 11/06/2001 Document Revised: 07/10/2016 Document Reviewed: 03/29/2015 Elsevier Interactive Patient Education  2017 [employer].

## 2017-08-07 ENCOUNTER — Telehealth: Payer: Self-pay | Admitting: Family

## 2017-08-07 NOTE — Telephone Encounter (Signed)
Call pt  She may make an appt to discuss smoking; she brought this up with denisa

## 2017-08-07 NOTE — Telephone Encounter (Signed)
Patient will be seen at [id number]

## 2017-08-13 ENCOUNTER — Ambulatory Visit (INDEPENDENT_AMBULATORY_CARE_PROVIDER_SITE_OTHER): Payer: PPO | Admitting: Family

## 2017-08-13 ENCOUNTER — Encounter: Payer: Self-pay | Admitting: Family

## 2017-08-13 VITALS — BP 116/68 | HR 64 | Temp 98.2°F | Ht 62.25 in | Wt 147.6 lb

## 2017-08-13 DIAGNOSIS — I10 Essential (primary) hypertension: Secondary | ICD-10-CM

## 2017-08-13 DIAGNOSIS — Z23 Encounter for immunization: Secondary | ICD-10-CM | POA: Diagnosis not present

## 2017-08-13 DIAGNOSIS — Z720 Tobacco use: Secondary | ICD-10-CM | POA: Diagnosis not present

## 2017-08-13 LAB — BASIC METABOLIC PANEL
BUN: 11 mg/dL (ref 6–23)
CHLORIDE: 103 meq/L (ref 96–112)
CO2: 28 mEq/L (ref 19–32)
CREATININE: 0.74 mg/dL (ref 0.40–1.20)
Calcium: 10 mg/dL (ref 8.4–10.5)
GFR: 82.6 mL/min (ref >60.00)
Glucose, Bld: 90 mg/dL (ref 70–99)
Potassium: 4.3 mEq/L (ref 3.5–5.1)
Sodium: 137 mEq/L (ref 135–145)

## 2017-08-13 MED ORDER — NICOTINE 14 MG/24HR TD PT24: 14.0000 mg | MEDICATED_PATCH | Freq: Every day | TRANSDERMAL | 5 refills | Status: DC

## 2017-08-13 NOTE — Progress Notes (Signed)
Subjective:    Patient ID: Heather Rodgers, female    DOB: May 28, 1946, 71 y.o.   MRN: [id number]  CC: Benjamin Merrihew is a 71 y.o. female who presents today for follow up.   HPI: Here to discuss quitting smoking.  CT chest lung screen Category 2S 01/2017.  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   Smokes one pack every 3 days. Cut back to one per day. 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   Of note: Discussed at length my mention of  vaginal bleeding during CPE. We jointly agree charting error on my behalf. She has no vaginal bleeding or pelvic pain.        HISTORY:  Past Medical History:  Diagnosis Date  . Alcohol abuse 2017   rehab 11/2015  . Anemia   . Arthritis   . Colitis 2017   c.difficile  . Hypertension   . Kidney stones    Past Surgical History:  Procedure Laterality Date  . NO PAST SURGERIES     Family History  Problem Relation Age of Onset  . Heart disease Mother   . Hypertension Mother   . Arthritis Father   . Heart disease Father   . Hypertension Father     Allergies: Penicillins Current Outpatient Prescriptions on File Prior to Visit  Medication Sig Dispense Refill  . amLODipine (NORVASC) 5 MG tablet Take 1 tablet (5 mg total) by mouth daily. 90 tablet 1  . aspirin EC 81 MG tablet Take 1 tablet by mouth daily.    . Cholecalciferol (D 5000) 5000 UNITS TABS Take 1 tablet by mouth daily.    Marland Kitchen gabapentin (NEURONTIN) 300 MG capsule Take 1 capsule (300 mg total) by mouth at bedtime. 90 capsule 1  . Glucosamine-MSM-Hyaluronic Acd (JOINT HEALTH PO) Take 1 tablet by mouth.    . losartan (COZAAR) 100 MG tablet Take 1 tablet (100 mg total) by mouth daily. 90 tablet 1  . pravastatin (PRAVACHOL) 40 MG tablet Take 1 tablet (40 mg total) by mouth daily. 90 tablet 2   No current facility-administered medications on file prior to visit.     Social History    Substance Use Topics  . Smoking status: Current Every Day Smoker    Packs/day: 1.50    Years: 47.00    Types: Cigarettes    Start date: 09/19/1995    Last attempt to quit: 12/01/2015  . Smokeless tobacco: Never Used  . Alcohol use No     Comment: sober since late December 2016    Review of Systems  Constitutional: Negative for chills and fever.  Respiratory: Negative for cough.   Cardiovascular: Negative for chest pain and palpitations.  Gastrointestinal: Negative for nausea and vomiting.      Objective:    BP 116/68   Pulse 64   Temp 98.2 F (36.8 C) (Oral)   Ht 5' 2.25" (1.581 m)   Wt 147 lb 9.6 oz (67 kg)   SpO2 96%   BMI 26.78 kg/m  BP Readings from Last 3 Encounters:  08/13/17 116/68  08/06/17 122/62  04/10/17 (!) 144/82   Wt Readings from Last 3 Encounters:  08/13/17 147 lb 9.6 oz (67 kg)  08/06/17 147 lb 12.8 oz (67 kg)  04/10/17 151 lb 3.2 oz (68.6 kg)    Physical Exam  Constitutional: She appears well-developed and well-nourished.  Eyes: Conjunctivae are normal.  Cardiovascular: Normal rate, regular rhythm, normal heart sounds and normal pulses.   Pulmonary/Chest: Effort normal and breath sounds normal. She has no wheezes. She has no rhonchi. She has no rales.  Neurological: She is alert.  Skin: Skin is warm and dry.  Psychiatric: She has a normal mood and affect. Her speech is normal and behavior is normal. Thought content normal.  Vitals reviewed.      Assessment & Plan:   Problem List Items Addressed This Visit      Cardiovascular and Mediastinum   HTN (hypertension) - Primary    At goal. Will continue current regimen.       Relevant Orders   Basic metabolic panel     Other   Current tobacco use    Trial of nicotine patches. Congratulated patient on sobriety. Will follow.       Relevant Medications   nicotine (NICODERM CQ - DOSED IN MG/24 HOURS) 14 mg/24hr patch    Other Visit Diagnoses    Encounter for immunization       Relevant  Orders   Flu vaccine HIGH DOSE PF (Completed)       I am having Ms. Dobrowski start on nicotine. I am also having her maintain her aspirin EC, Cholecalciferol, amLODipine, losartan, pravastatin, gabapentin, and Glucosamine-MSM-Hyaluronic Acd (JOINT HEALTH PO).   Meds ordered this encounter  Medications  . nicotine (NICODERM CQ - DOSED IN MG/24 HOURS) 14 mg/24hr patch    Sig: Place 1 patch (14 mg total) onto the skin daily.    Dispense:  30 patch    Refill:  5    Order Specific Question:   Supervising Provider    Answer:   Sherlene Shams [[id number]]    Return precautions given.   Risks, benefits, and alternatives of the medications and treatment plan prescribed today were discussed, and patient expressed understanding.   Education regarding symptom management and diagnosis given to patient on AVS.  Continue to follow with Allegra Grana, FNP for routine health maintenance.   Heather Rodgers and I agreed with plan.   [name], FNP

## 2017-08-13 NOTE — Assessment & Plan Note (Signed)
At goal. Will continue current regimen.  

## 2017-08-13 NOTE — Assessment & Plan Note (Addendum)
Trial of nicotine patches. Congratulated patient on sobriety. Will follow.

## 2017-08-13 NOTE — Patient Instructions (Addendum)
Labs today  Let's try the nicotine patch and GOOD LUCK!   Steps to Quit Smoking Smoking tobacco can be bad for your health. It can also affect almost every organ in your body. Smoking puts you and people around you at risk for many serious long-lasting (chronic) diseases. Quitting smoking is hard, but it is one of the best things that you can do for your health. It is never too late to quit. What are the benefits of quitting smoking? When you quit smoking, you lower your risk for getting serious diseases and conditions. They can include:  Lung cancer or lung disease.  Heart disease.  Stroke.  Heart attack.  Not being able to have children (infertility).  Weak bones (osteoporosis) and broken bones (fractures).  If you have coughing, wheezing, and shortness of breath, those symptoms may get better when you quit. You may also get sick less often. If you are pregnant, quitting smoking can help to lower your chances of having a baby of low birth weight. What can I do to help me quit smoking? Talk with your doctor about what can help you quit smoking. Some things you can do (strategies) include:  Quitting smoking totally, instead of slowly cutting back how much you smoke over a period of time.  Going to in-person counseling. You are more likely to quit if you go to many counseling sessions.  Using resources and support systems, such as: ? Agricultural engineer with a Veterinary surgeon. ? Phone quitlines. ? Automotive engineer. ? Support groups or group counseling. ? Text messaging programs. ? Mobile phone apps or applications.  Taking medicines. Some of these medicines may have nicotine in them. If you are pregnant or breastfeeding, do not take any medicines to quit smoking unless your doctor says it is okay. Talk with your doctor about counseling or other things that can help you.  Talk with your doctor about using more than one strategy at the same time, such as taking medicines while you  are also going to in-person counseling. This can help make quitting easier. What things can I do to make it easier to quit? Quitting smoking might feel very hard at first, but there is a lot that you can do to make it easier. Take these steps:  Talk to your family and friends. Ask them to support and encourage you.  Call phone quitlines, reach out to support groups, or work with a Veterinary surgeon.  Ask people who smoke to not smoke around you.  Avoid places that make you want (trigger) to smoke, such as: ? Bars. ? Parties. ? Smoke-break areas at work.  Spend time with people who do not smoke.  Lower the stress in your life. Stress can make you want to smoke. Try these things to help your stress: ? Getting regular exercise. ? Deep-breathing exercises. ? Yoga. ? Meditating. ? Doing a body scan. To do this, close your eyes, focus on one area of your body at a time from head to toe, and notice which parts of your body are tense. Try to relax the muscles in those areas.  Download or buy apps on your mobile phone or tablet that can help you stick to your quit plan. There are many free apps, such as QuitGuide from the [employer] developer for Disease Control and Prevention). You can find more support from smokefree.gov and other websites.  This information is not intended to replace advice given to you by your health care provider. Make sure you discuss  any questions you have with your health care provider. Document Released: 09/08/2009 Document Revised: 07/10/2016 Document Reviewed: 03/29/2015 Elsevier Interactive Patient Education  2018 [employer].

## 2017-08-19 DIAGNOSIS — H31091 Other chorioretinal scars, right eye: Secondary | ICD-10-CM | POA: Diagnosis not present

## 2017-08-19 DIAGNOSIS — H34812 Central retinal vein occlusion, left eye, with macular edema: Secondary | ICD-10-CM | POA: Diagnosis not present

## 2017-08-19 DIAGNOSIS — H353131 Nonexudative age-related macular degeneration, bilateral, early dry stage: Secondary | ICD-10-CM | POA: Diagnosis not present

## 2017-08-19 DIAGNOSIS — H35033 Hypertensive retinopathy, bilateral: Secondary | ICD-10-CM | POA: Diagnosis not present

## 2017-09-03 DIAGNOSIS — E2839 Other primary ovarian failure: Secondary | ICD-10-CM | POA: Diagnosis not present

## 2017-09-03 DIAGNOSIS — Z1231 Encounter for screening mammogram for malignant neoplasm of breast: Secondary | ICD-10-CM | POA: Diagnosis not present

## 2017-09-03 DIAGNOSIS — Z780 Asymptomatic menopausal state: Secondary | ICD-10-CM | POA: Diagnosis not present

## 2017-09-05 ENCOUNTER — Telehealth: Payer: Self-pay | Admitting: Family

## 2017-09-05 NOTE — Telephone Encounter (Signed)
Patient was informed of results.  Patient understood and no questions, comments, or concerns at this time.  

## 2017-09-05 NOTE — Telephone Encounter (Signed)
Call Patient. Please let her know that her mammogram was normal. No suspicious masses, calcifications. She may repeat mammogram in one year

## 2017-09-14 IMAGING — CR DG CHEST 2V
1 series · 2 of 2 positions shown · non-contrast
Comparison: No priors.

CLINICAL DATA: 69-year-old female with chronic cough, slightly
blood-tinged. History of COPD. Current smoker.

EXAM:
CHEST  2 VIEW

[Series 1: dg chest 2 view · 0.14mm/px · 2 of 2 slices shown]
[im 1/2]
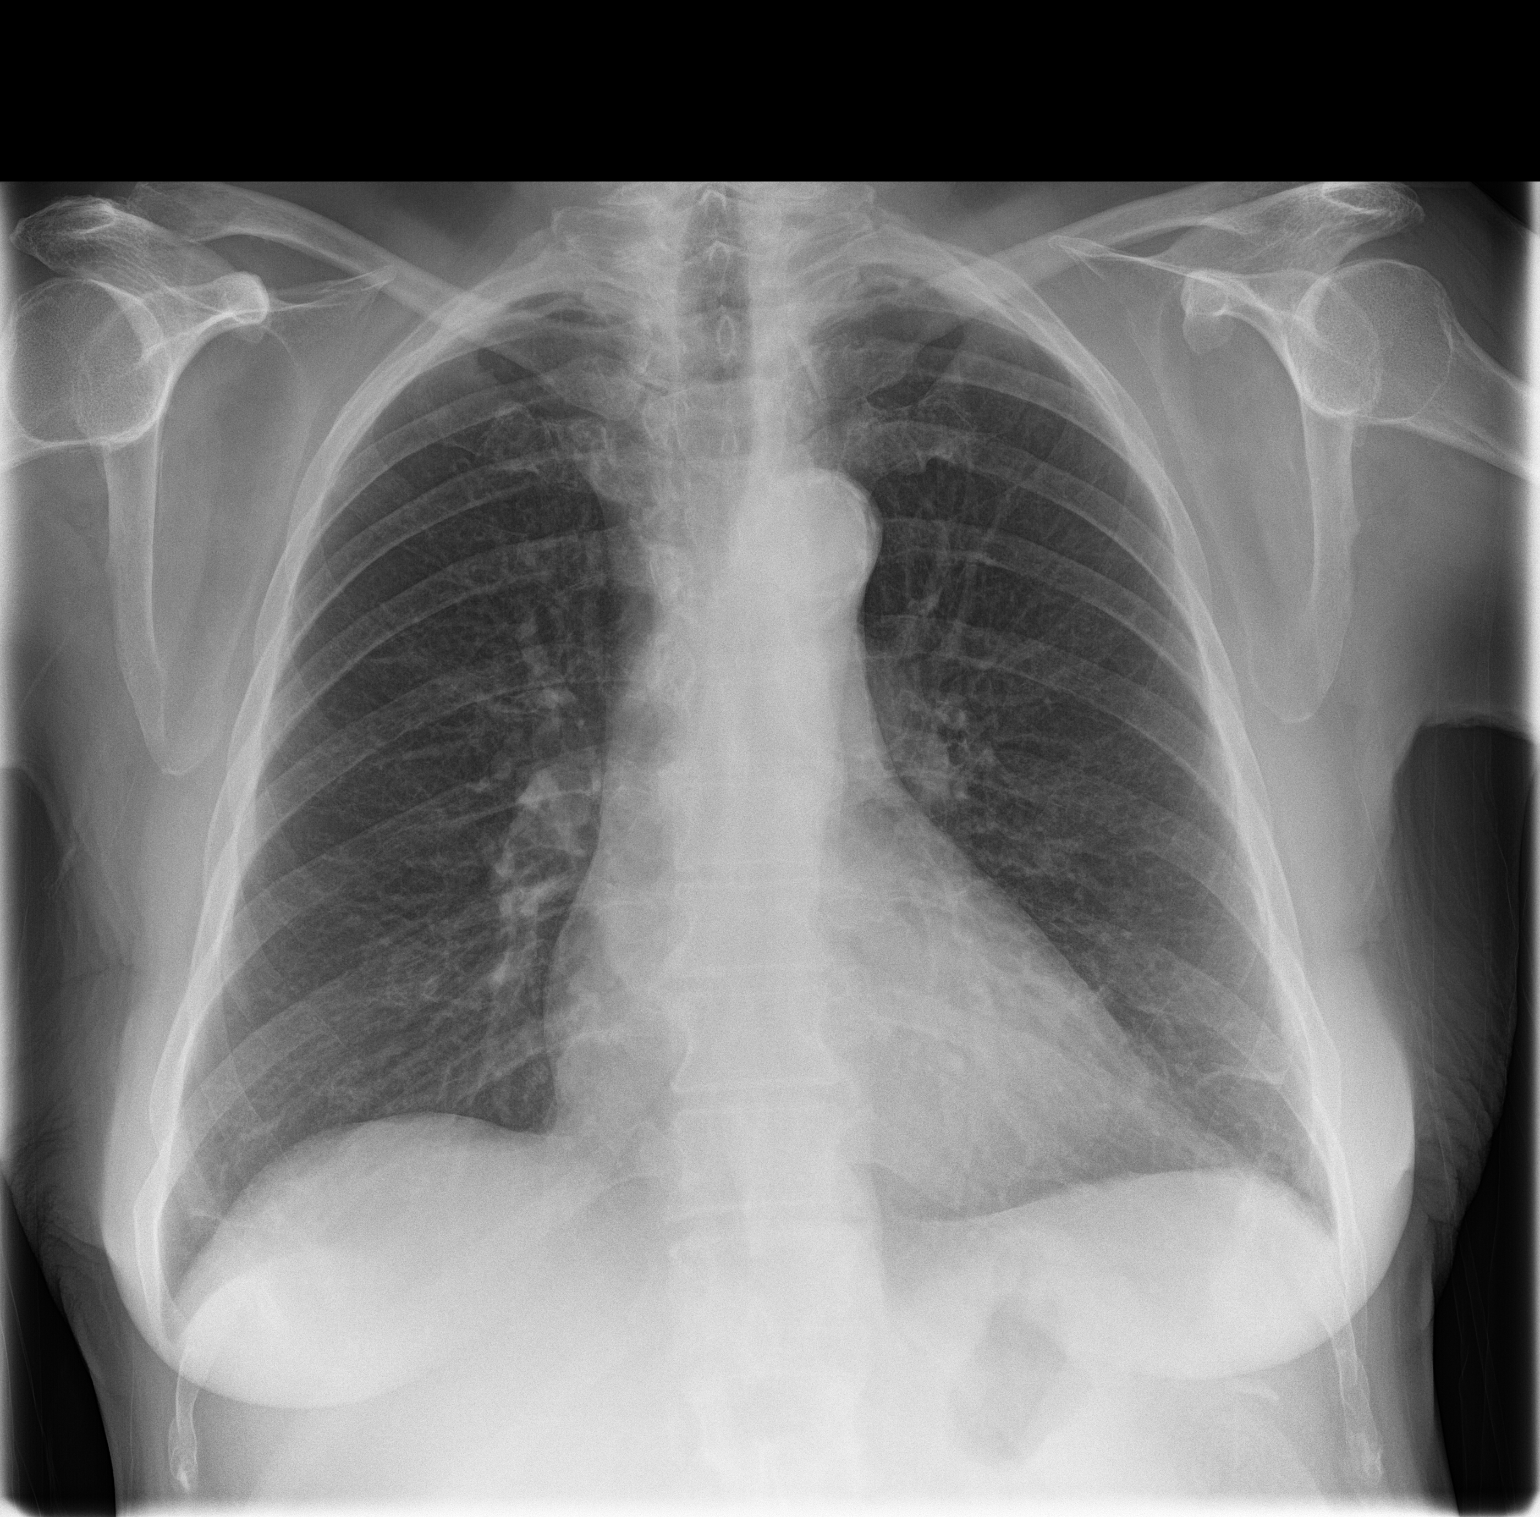
[im 2/2]
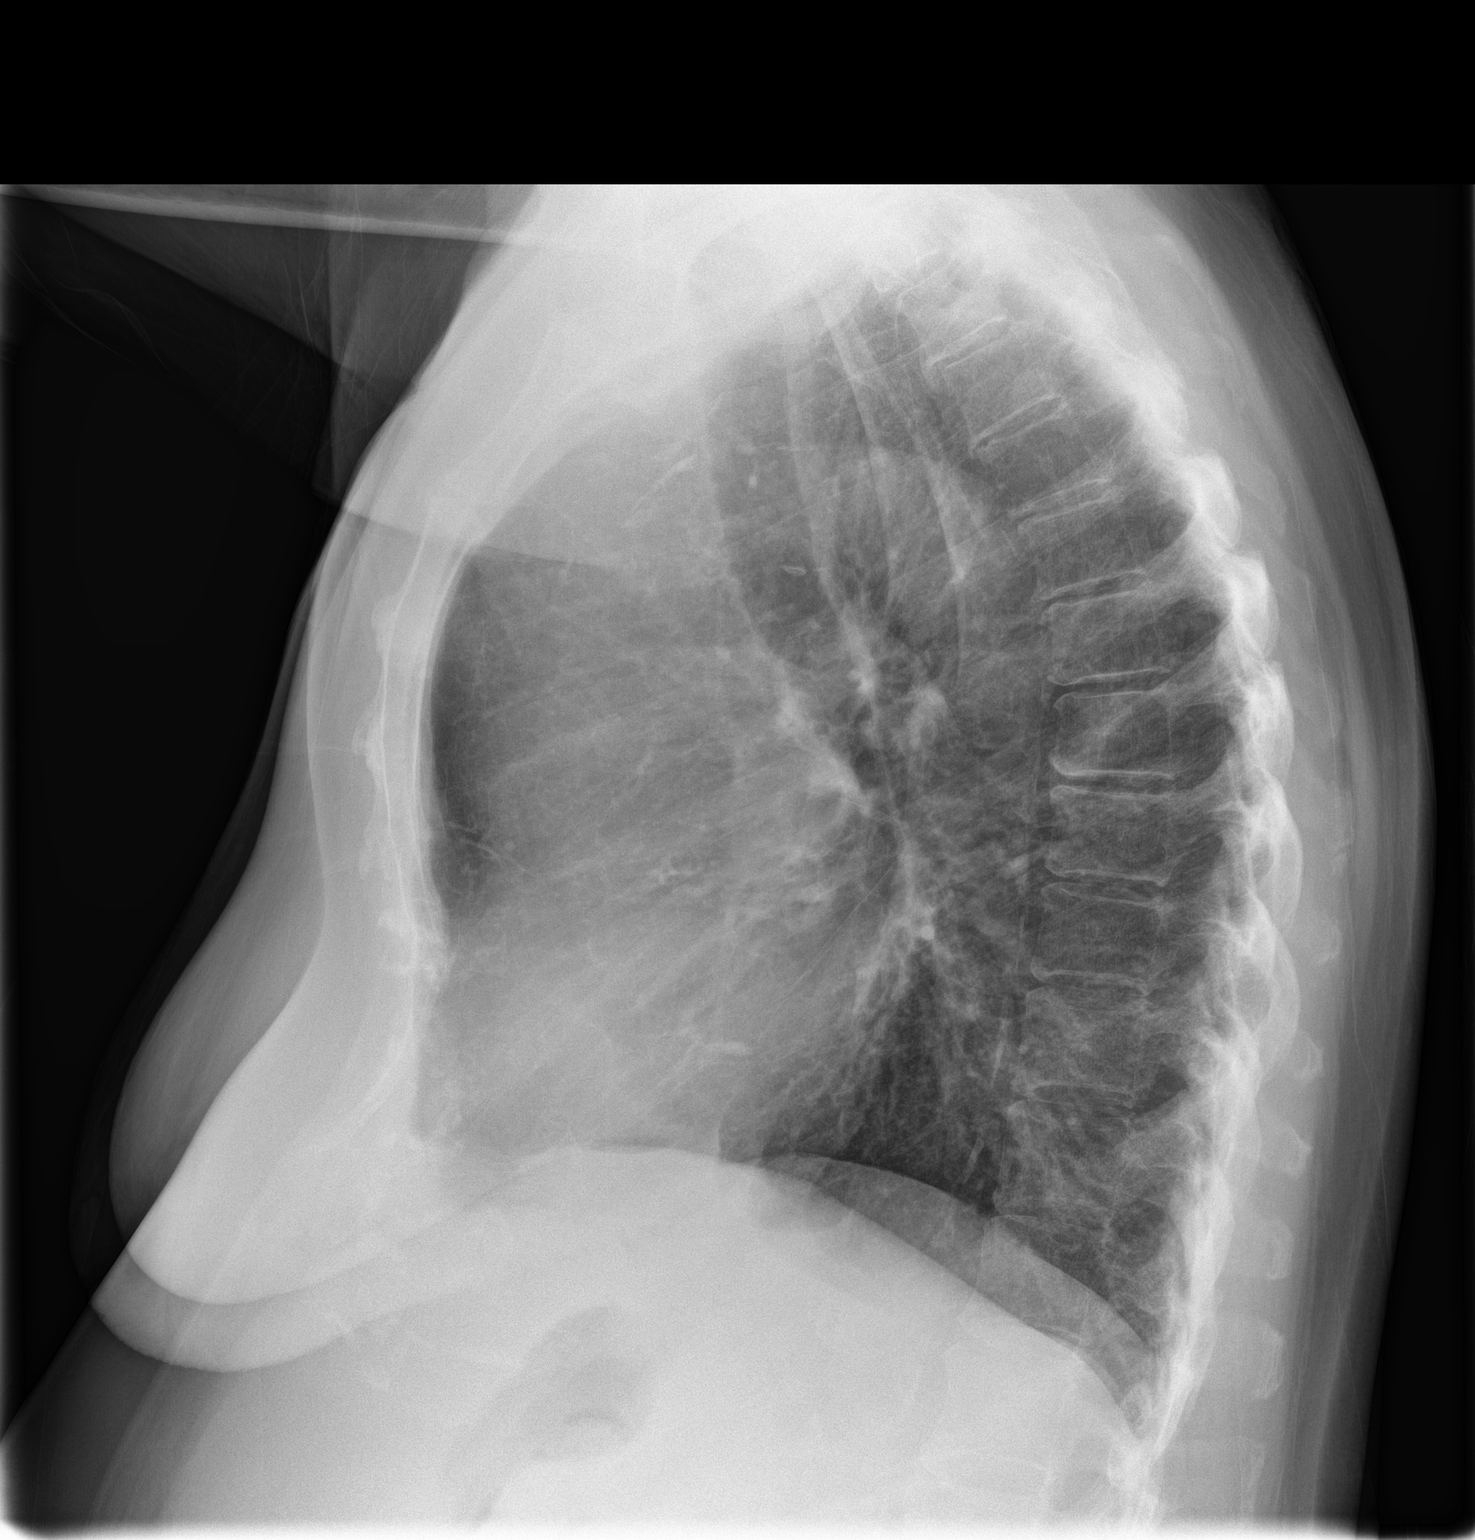

[2 of 2 positions shown; findings below may reference images not displayed]

FINDINGS: Mild diffuse peribronchial cuffing. There is a suggestion of some
tram track opacities in the lingula and right middle lobe,
concerning for areas of cylindrical bronchiectasis. No acute
consolidative airspace disease. No pleural effusions. No evidence of
pulmonary edema. Heart size is normal. Upper mediastinal contours
are within normal limits. Atherosclerosis in the thoracic aorta.
IMPRESSION: 1. Diffuse peribronchial cuffing, suggestive of bronchitis, which
could be acute or chronic.
2. In addition, there is evidence suggestive of areas of
bronchiectasis in the right middle lobe and lingula. In this
demographic, this raises concern for possible indolent atypical
infection such as mycobacterium avium intracellulare (AMIN). Further
evaluation with high-resolution chest CT is suggested in the near
future to better characterize these findings.

## 2017-10-07 ENCOUNTER — Other Ambulatory Visit: Payer: Self-pay | Admitting: Family

## 2017-10-07 DIAGNOSIS — I10 Essential (primary) hypertension: Secondary | ICD-10-CM

## 2018-01-06 ENCOUNTER — Other Ambulatory Visit: Payer: Self-pay | Admitting: Family

## 2018-01-06 DIAGNOSIS — I10 Essential (primary) hypertension: Secondary | ICD-10-CM

## 2018-01-10 ENCOUNTER — Other Ambulatory Visit: Payer: Self-pay | Admitting: Family

## 2018-01-28 ENCOUNTER — Telehealth: Payer: Self-pay | Admitting: Family

## 2018-01-28 ENCOUNTER — Telehealth: Payer: Self-pay | Admitting: *Deleted

## 2018-01-28 NOTE — Telephone Encounter (Signed)
Please mail letter-   Hope you are well.   In reviewing your chart, it appears your are due for annual mammogram.  Please let us know if you would like for me to order. You may call the office to let us know, and we will order for you.   Once the order in the system, you may schedule at your preferred location.   Typically women have been using one of the sites below however you may go where you have been in the past. I would ensure that when you do get a mammogram that it is 3D ( as opposed to 2D which was prior technology). Evidence suggests that 3D is superior.   Please note that NOT all insurance companies cover 3D, and you may have to pay a higher copay. You may call your insurance company to further clarify your benefits.   Options for Mammogram in [place]:    [facility]  [address]  [place], [place]  [phone]   [facility] [address] [place], [place] [phone]   Let us know if you have questions.   My best,   Alissah Redmon, NP   

## 2018-01-28 NOTE — Telephone Encounter (Signed)
Notified patient that annual lung cancer screening low dose CT scan is due currently or will be in near future. Patient requests that I contact her after her next appt with her PCP later in the month.

## 2018-02-04 NOTE — Telephone Encounter (Signed)
Letter printed and mailed.  

## 2018-02-07 NOTE — Telephone Encounter (Signed)
close

## 2018-02-26 ENCOUNTER — Other Ambulatory Visit: Payer: Self-pay | Admitting: Family

## 2018-02-26 DIAGNOSIS — I10 Essential (primary) hypertension: Secondary | ICD-10-CM

## 2018-02-26 DIAGNOSIS — E785 Hyperlipidemia, unspecified: Secondary | ICD-10-CM

## 2018-03-07 ENCOUNTER — Telehealth: Payer: Self-pay | Admitting: *Deleted

## 2018-03-07 NOTE — Telephone Encounter (Signed)
Left message for patient to notify them that it is time to schedule annual low dose lung cancer screening CT scan. Instructed patient to call back to verify information prior to the scan being scheduled.  

## 2018-03-20 ENCOUNTER — Telehealth: Payer: Self-pay | Admitting: *Deleted

## 2018-03-20 NOTE — Telephone Encounter (Signed)
Left message for patient to notify them that it is time to schedule annual low dose lung cancer screening CT scan. Instructed patient to call back to verify information prior to the scan being scheduled.  

## 2018-04-23 ENCOUNTER — Other Ambulatory Visit: Payer: Self-pay | Admitting: Family

## 2018-04-23 DIAGNOSIS — I10 Essential (primary) hypertension: Secondary | ICD-10-CM

## 2018-04-24 NOTE — Telephone Encounter (Signed)
Call pt I refilled losartan Please ensure she speaks with pharmacist and that her LOT/manufacturer was not recalled  Also, she is due for follow up and labs   Please advise appt

## 2018-04-24 NOTE — Telephone Encounter (Signed)
Last refill 10/07/17 ? Last office visit 08/13/17

## 2018-05-02 ENCOUNTER — Encounter: Payer: Self-pay | Admitting: *Deleted

## 2018-05-02 NOTE — Telephone Encounter (Signed)
Appointment scheduled , patient states pharmacist has talked to her about re-call and changd her to losartan 50 mg she is  taking 2 tablets daily until pharmacy  gets in  100mg  tablets by different manufacturer.

## 2018-06-02 ENCOUNTER — Other Ambulatory Visit: Payer: Self-pay | Admitting: Family

## 2018-06-02 DIAGNOSIS — I10 Essential (primary) hypertension: Secondary | ICD-10-CM

## 2018-06-23 ENCOUNTER — Telehealth: Payer: Self-pay

## 2018-06-23 ENCOUNTER — Ambulatory Visit (INDEPENDENT_AMBULATORY_CARE_PROVIDER_SITE_OTHER): Payer: PPO | Admitting: Family

## 2018-06-23 ENCOUNTER — Encounter: Payer: Self-pay | Admitting: Family

## 2018-06-23 VITALS — BP 138/70 | HR 64 | Temp 97.8°F | Resp 16 | Wt 154.4 lb

## 2018-06-23 DIAGNOSIS — I10 Essential (primary) hypertension: Secondary | ICD-10-CM

## 2018-06-23 DIAGNOSIS — M25472 Effusion, left ankle: Secondary | ICD-10-CM | POA: Diagnosis not present

## 2018-06-23 DIAGNOSIS — E785 Hyperlipidemia, unspecified: Secondary | ICD-10-CM

## 2018-06-23 DIAGNOSIS — R55 Syncope and collapse: Secondary | ICD-10-CM

## 2018-06-23 DIAGNOSIS — Z13820 Encounter for screening for osteoporosis: Secondary | ICD-10-CM | POA: Diagnosis not present

## 2018-06-23 LAB — COMPREHENSIVE METABOLIC PANEL
ALT: 9 U/L (ref 0–35)
AST: 12 U/L (ref 0–37)
Albumin: 4.5 g/dL (ref 3.5–5.2)
Alkaline Phosphatase: 40 U/L (ref 39–117)
BILIRUBIN TOTAL: 0.6 mg/dL (ref 0.2–1.2)
BUN: 16 mg/dL (ref 6–23)
CALCIUM: 10.2 mg/dL (ref 8.4–10.5)
CO2: 28 mEq/L (ref 19–32)
CREATININE: 0.8 mg/dL (ref 0.40–1.20)
Chloride: 104 mEq/L (ref 96–112)
GFR: 75.31 mL/min (ref >60.00)
Glucose, Bld: 96 mg/dL (ref 70–99)
Potassium: 4.6 mEq/L (ref 3.5–5.1)
Sodium: 139 mEq/L (ref 135–145)
Total Protein: 7.9 g/dL (ref 6.0–8.3)

## 2018-06-23 LAB — CBC WITH DIFFERENTIAL/PLATELET
BASOS PCT: 0.7 % (ref 0.0–3.0)
Basophils Absolute: 0.1 10*3/uL (ref 0.0–0.1)
Eosinophils Absolute: 0.2 10*3/uL (ref 0.0–0.7)
Eosinophils Relative: 2.4 % (ref 0.0–5.0)
HCT: 38.7 % (ref 36.0–46.0)
HEMOGLOBIN: 12.8 g/dL (ref 12.0–15.0)
Lymphocytes Relative: 26.6 % (ref 12.0–46.0)
Lymphs Abs: 1.9 10*3/uL (ref 0.7–4.0)
MCHC: 33 g/dL (ref 30.0–36.0)
MCV: 94 fl (ref 78.0–100.0)
MONOS PCT: 6 % (ref 3.0–12.0)
Monocytes Absolute: 0.4 10*3/uL (ref 0.1–1.0)
Neutro Abs: 4.6 10*3/uL (ref 1.4–7.7)
Neutrophils Relative %: 64.3 % (ref 43.0–77.0)
Platelets: 228 10*3/uL (ref 150.0–400.0)
RBC: 4.12 Mil/uL (ref 3.87–5.11)
RDW: 13.6 % (ref 11.5–15.5)
WBC: 7.1 10*3/uL (ref 4.0–10.5)

## 2018-06-23 LAB — LIPID PANEL
Cholesterol: 158 mg/dL (ref 0–200)
HDL: 41 mg/dL (ref >39.00)
LDL CALC: 102 mg/dL — AB (ref 0–99)
NONHDL: 116.57
Total CHOL/HDL Ratio: 4
Triglycerides: 75 mg/dL (ref 0.0–149.0)
VLDL: 15 mg/dL (ref 0.0–40.0)

## 2018-06-23 LAB — HEMOGLOBIN A1C: Hgb A1c MFr Bld: 6.2 % (ref 4.6–6.5)

## 2018-06-23 LAB — MICROALBUMIN / CREATININE URINE RATIO
CREATININE, U: 33.4 mg/dL
MICROALB UR: 2.9 mg/dL — AB (ref 0.0–1.9)
MICROALB/CREAT RATIO: 8.7 mg/g (ref 0.0–30.0)

## 2018-06-23 MED ORDER — LOSARTAN POTASSIUM 100 MG PO TABS: 100.0000 mg | ORAL_TABLET | Freq: Every day | ORAL | 1 refills | Status: DC

## 2018-06-23 MED ORDER — PRAVASTATIN SODIUM 40 MG PO TABS: 40.0000 mg | ORAL_TABLET | Freq: Every day | ORAL | 1 refills | Status: DC

## 2018-06-23 NOTE — Assessment & Plan Note (Addendum)
New.  First episode.  Concern as no prodromal symptoms.  Considering may be vasovagal due to nature of hot coffee however patient has strong cardiovascular risk factors.  EKG today is negative for acute ischemia however p waves are difficult to see and she appears to have AV block no prior EKG to compare to ( unable to view one from [facility]). Case and EKG reviewed with supervising, Dr Duncan Dulleresa Tullo, and she and I jointly agreed on management plan. We agreed on cardiology referral.

## 2018-06-23 NOTE — Progress Notes (Signed)
She has an appointment tomorrow [facility] at 9:30 .

## 2018-06-23 NOTE — Patient Instructions (Addendum)
Please return call for CT chest lung cancer screening.   Today we discussed referrals, orders. Bone density, cardiology   I have placed these orders in the system for you.  Please be sure to give us a call if you have not heard from our office regarding scheduling a test or regarding referral in a timely manner.  It is very important that you let me know as soon as possible.   Please remain hypervigilant for any concerns for cardiac symptoms which would include chest pain, syncopal episode or passing out, dizziness, palpitation, left arm pain as discussed.  Please give us a call in the next couple days if you do not have an appointment with cardiology ASAP.

## 2018-06-23 NOTE — Telephone Encounter (Signed)
Copied from CRM [id number]. Topic: Referral - Request >> Jun 23, 2018 11:15 AM Jolayne Hainesaylor, Brittany L wrote: Reason for CRM: Patient states she does not want to [facility] tomorrow at 9:30. She would like her referral to go anywhere else beside there. Please advise.

## 2018-06-23 NOTE — Assessment & Plan Note (Signed)
Chronic. Very trace over lateral malleolus.  Wells criteria low risk for DVT.  No difference in calf circumference.  Patient politely declines ultrasound as she is noted this asymmetry for some time.  Advised close monitoring and certainly to let me know if were to worsen.

## 2018-06-23 NOTE — Assessment & Plan Note (Signed)
Compliant.  Pending lipid panel 

## 2018-06-23 NOTE — Progress Notes (Signed)
Subjective:    Patient ID: Heather Rodgers, female    DOB: 1946-07-31, 72 y.o.   MRN: [id number]  CC: [name] is a 72 y.o. female who presents today for follow up.   HPI: Over all feels well.   Went to gym this morning.   Notes left ankle swelling, improves with elevation. This has been for years, feels unchanged.  Works as Investment banker, operational. NO calf pain, injury. No recent immobilization. No SOB. No h/o DVT, malignancy.  HLD- Compliant with medication.   HTN- compliant with medication. Denies exertional chest pain or pressure, numbness or tingling radiating to left arm or jaw, palpitations, dizziness, frequent headaches, changes in vision, or shortness of breath.   Smokes couple cigarettes a day, improved from one pack. Smokes with bored  Notes one episode of passing out when drinking coffee 2 months ago. Thinks swallowed coffee too fast. No  palpitations, diaphoresis, light headedness.   3 years sobriety.        CT chest urology HISTORY:  Past Medical History:  Diagnosis Date  . Alcohol abuse 2017   rehab 11/2015  . Anemia   . Arthritis   . Colitis 2017   c.difficile  . Hypertension   . Kidney stones    Past Surgical History:  Procedure Laterality Date  . NO PAST SURGERIES     Family History  Problem Relation Age of Onset  . Heart disease Mother   . Hypertension Mother   . Arthritis Father   . Heart disease Father   . Hypertension Father     Allergies: Penicillins Current Outpatient Medications on File Prior to Visit  Medication Sig Dispense Refill  . amLODipine (NORVASC) 5 MG tablet Take 1 tablet (5 mg total) by mouth daily. 90 tablet 0  . aspirin EC 81 MG tablet Take 1 tablet by mouth daily.    . Cholecalciferol (D 5000) 5000 UNITS TABS Take 1 tablet by mouth daily.    Marland Kitchen gabapentin (NEURONTIN) 300 MG capsule Take 1 capsule (300 mg total) by mouth at bedtime. 90 capsule 1  . Glucosamine-MSM-Hyaluronic Acd (JOINT HEALTH PO) Take 1  tablet by mouth.     No current facility-administered medications on file prior to visit.     Social History   Tobacco Use  . Smoking status: Current Every Day Smoker    Packs/day: 1.50    Years: 47.00    Pack years: 70.50    Types: Cigarettes    Start date: 09/19/1995    Last attempt to quit: 12/01/2015    Years since quitting: 2.5  . Smokeless tobacco: Never Used  Substance Use Topics  . Alcohol use: No    Alcohol/week: 0.0 oz    Comment: sober since late December 2016  . Drug use: No    Review of Systems  Constitutional: Negative for chills and fever.  Respiratory: Negative for cough.   Cardiovascular: Positive for leg swelling (left ankle). Negative for chest pain and palpitations.  Gastrointestinal: Negative for nausea and vomiting.  Neurological: Positive for syncope. Negative for dizziness and weakness.      Objective:    BP 138/70 (BP Location: Left Arm, Patient Position: Sitting, Cuff Size: Normal)   Pulse 64   Temp 97.8 F (36.6 C) (Oral)   Resp 16   Wt 154 lb 6 oz (70 kg)   SpO2 99%   BMI 28.01 kg/m  BP Readings from Last 3 Encounters:  06/23/18 138/70  08/13/17 116/68  08/06/17  122/62   Wt Readings from Last 3 Encounters:  06/23/18 154 lb 6 oz (70 kg)  08/13/17 147 lb 9.6 oz (67 kg)  08/06/17 147 lb 12.8 oz (67 kg)    Physical Exam  Constitutional: She appears well-developed and well-nourished.  Eyes: Conjunctivae are normal.  Cardiovascular: Normal rate, regular rhythm, normal heart sounds and normal pulses.  Trace pedal edema, non pitting over left lateral malleolus.   No RLE edema.   Bilateral LE:   No palpable cords or masses. No erythema or increased warmth. No asymmetry in calf size when compared bilaterally LE hair growth symmetric and present. No discoloration of varicosities noted. LE warm and palpable pedal pulses.   Pulmonary/Chest: Effort normal and breath sounds normal. She has no wheezes. She has no rhonchi. She has no  rales.  Neurological: She is alert.  Skin: Skin is warm and dry.  Psychiatric: She has a normal mood and affect. Her speech is normal and behavior is normal. Thought content normal.  Vitals reviewed.      Assessment & Plan:   Problem List Items Addressed This Visit      Cardiovascular and Mediastinum   HTN (hypertension) - Primary    Controlled.  Will continue current regimen.      Relevant Medications   losartan (COZAAR) 100 MG tablet   pravastatin (PRAVACHOL) 40 MG tablet   Other Relevant Orders   Comprehensive metabolic panel   Microalbumin / creatinine urine ratio   CBC with Differential/Platelet   Hemoglobin A1c   Ambulatory referral to Cardiology   Syncope    New.  First episode.  Concern as no prodromal symptoms.  Considering may be vasovagal due to nature of hot coffee however patient has strong cardiovascular risk factors.  EKG today is negative for acute ischemia however p waves are difficult to see and she appears to have AV block no prior EKG to compare to ( unable to view one from [facility]). Case and EKG reviewed with supervising, Dr Duncan Dull, and she and I jointly agreed on management plan. We agreed on cardiology referral.        Relevant Medications   losartan (COZAAR) 100 MG tablet   pravastatin (PRAVACHOL) 40 MG tablet   Other Relevant Orders   EKG 12-Lead (Completed)   Ambulatory referral to Cardiology     Other   HLD (hyperlipidemia)    Compliant.  Pending lipid panel.      Relevant Medications   losartan (COZAAR) 100 MG tablet   pravastatin (PRAVACHOL) 40 MG tablet   Other Relevant Orders   Lipid panel   Hemoglobin A1c   Screening for osteoporosis   Relevant Orders   DG Bone Density   Left ankle swelling    Chronic. Very trace over lateral malleolus.  Wells criteria low risk for DVT.  No difference in calf circumference.  Patient politely declines ultrasound as she is noted this asymmetry for some time.  Advised close monitoring and  certainly to let me know if were to worsen.          I have discontinued Pernell Dupre. Legere's nicotine. I am also having her maintain her aspirin EC, Cholecalciferol, gabapentin, Glucosamine-MSM-Hyaluronic Acd (JOINT HEALTH PO), amLODipine, losartan, and pravastatin.   Meds ordered this encounter  Medications  . losartan (COZAAR) 100 MG tablet    Sig: Take 1 tablet (100 mg total) by mouth daily.    Dispense:  90 tablet    Refill:  1  . pravastatin (PRAVACHOL)  40 MG tablet    Sig: Take 1 tablet (40 mg total) by mouth daily.    Dispense:  90 tablet    Refill:  1    Return precautions given.   Risks, benefits, and alternatives of the medications and treatment plan prescribed today were discussed, and patient expressed understanding.   Education regarding symptom management and diagnosis given to patient on AVS.  Continue to follow with Allegra GranaArnett, Antinette Keough G, FNP for routine health maintenance.   Heather StackBrenda Joyce Wright Majette and I agreed with plan.   Rennie PlowmanMargaret Jossilyn Benda, FNP

## 2018-06-23 NOTE — Assessment & Plan Note (Signed)
Controlled. Will continue current regimen 

## 2018-06-25 ENCOUNTER — Telehealth: Payer: Self-pay | Admitting: Family

## 2018-06-25 ENCOUNTER — Other Ambulatory Visit: Payer: Self-pay | Admitting: Family

## 2018-06-25 DIAGNOSIS — E785 Hyperlipidemia, unspecified: Secondary | ICD-10-CM

## 2018-06-26 ENCOUNTER — Telehealth: Payer: Self-pay

## 2018-06-26 NOTE — Telephone Encounter (Signed)
Urgent Referral attempted to contact for an opening today .  No ans no vm

## 2018-06-27 NOTE — Telephone Encounter (Signed)
close

## 2018-07-01 ENCOUNTER — Other Ambulatory Visit: Payer: Self-pay | Admitting: Family

## 2018-07-01 DIAGNOSIS — E785 Hyperlipidemia, unspecified: Secondary | ICD-10-CM

## 2018-07-01 MED ORDER — PRAVASTATIN SODIUM 80 MG PO TABS: 80.0000 mg | ORAL_TABLET | Freq: Every day | ORAL | 1 refills | Status: DC

## 2018-07-01 NOTE — Progress Notes (Signed)
close

## 2018-07-22 ENCOUNTER — Ambulatory Visit: Payer: PPO | Admitting: Cardiovascular Disease

## 2018-07-22 ENCOUNTER — Encounter: Payer: Self-pay | Admitting: Cardiovascular Disease

## 2018-07-22 ENCOUNTER — Encounter

## 2018-07-22 VITALS — BP 134/70 | HR 64 | Ht 62.5 in | Wt 154.0 lb

## 2018-07-22 DIAGNOSIS — R55 Syncope and collapse: Secondary | ICD-10-CM

## 2018-07-22 DIAGNOSIS — I10 Essential (primary) hypertension: Secondary | ICD-10-CM

## 2018-07-22 NOTE — Patient Instructions (Signed)
Medication Instructions: STOP the aspirin  If you need a refill on your cardiac medications before your next appointment, please call your pharmacy.   Follow-Up: Your physician wants you to follow-up as needed with Dr. Kirke CorinArida.    Thank you for choosing [facility]!

## 2018-07-22 NOTE — Progress Notes (Signed)
Cardiology Office Note   Date:  07/22/2018   ID:  Heather Rodgers, DOB 02/10/1946, MRN [id number]  PCP:  Allegra Grana, FNP  Cardiologist:   Lorine Bears, MD   Chief Complaint  Patient presents with  . New Patient (Initial Visit)    Per M Arnette for Essential HTN and swelling in left ankle. Meds reviewed verbally with patient.       History of Present Illness: Heather Rodgers is a 72 y.o. female who was referred by [name] for evaluation of syncope.  The patient has no prior cardiac history.  She has prior history of alcohol abuse but she has been sober for 3 years.  She reports having episodes of loss of consciousness at that time in the setting of excessive alcohol use.  She has other chronic medical conditions that include essential hypertension and hyperlipidemia. She had an an episode of brief loss of consciousness on Mother's Day.  She was by herself and she swallowed hot coffee quickly followed by brief loss of consciousness for a few seconds with no injuries.  She had no recurrent episodes since then.  She denies any chest pain, shortness of breath or palpitations.  Over the last 3 years, she has followed healthy lifestyle and has been exercising regularly at the gym at least 4 times weekly with no limitations.  She denies exertional symptoms.    Past Medical History:  Diagnosis Date  . Alcohol abuse 2017   rehab 11/2015  . Anemia   . Arthritis   . Colitis 2017   c.difficile  . Hypertension   . Kidney stones     Past Surgical History:  Procedure Laterality Date  . NO PAST SURGERIES       Current Outpatient Medications  Medication Sig Dispense Refill  . amLODipine (NORVASC) 5 MG tablet Take 1 tablet (5 mg total) by mouth daily. 90 tablet 0  . aspirin EC 81 MG tablet Take 1 tablet by mouth daily.    . Cholecalciferol (D 5000) 5000 UNITS TABS Take 1 tablet by mouth daily.    Marland Kitchen gabapentin (NEURONTIN) 300 MG capsule Take 1  capsule (300 mg total) by mouth at bedtime. 90 capsule 1  . Glucosamine-MSM-Hyaluronic Acd (JOINT HEALTH PO) Take 1 tablet by mouth.    . losartan (COZAAR) 100 MG tablet Take 1 tablet (100 mg total) by mouth daily. 90 tablet 1  . pravastatin (PRAVACHOL) 80 MG tablet Take 1 tablet (80 mg total) by mouth daily. 90 tablet 1   No current facility-administered medications for this visit.     Allergies:   Penicillins    Social History:  The patient  reports that she has been smoking cigarettes. She started smoking about 22 years ago. She has a 70.50 pack-year smoking history. She has never used smokeless tobacco. She reports that she does not drink alcohol or use drugs.   Family History:  The patient's family history includes Arthritis in her father; Heart disease in her father and mother; Hypertension in her father and mother.    ROS:  Please see the history of present illness.   Otherwise, review of systems are positive for none.   All other systems are reviewed and negative.    PHYSICAL EXAM: VS:  BP 134/70 (BP Location: Left Arm, Patient Position: Sitting, Cuff Size: Normal)   Pulse 64   Ht 5' 2.5" (1.588 m)   Wt 154 lb (69.9 kg)   BMI 27.72 kg/m  ,  BMI Body mass index is 27.72 kg/m. GEN: Well nourished, well developed, in no acute distress  HEENT: normal  Neck: no JVD, carotid bruits, or masses Cardiac: RRR; no murmurs, rubs, or gallops,no edema  Respiratory:  clear to auscultation bilaterally, normal work of breathing GI: soft, nontender, nondistended, + BS MS: no deformity or atrophy  Skin: warm and dry, no rash Neuro:  Strength and sensation are intact Psych: euthymic mood, full affect   EKG:  EKG is ordered today. The ekg ordered today demonstrates normal sinus rhythm with first-degree AV block.   Recent Labs: 06/23/2018: ALT 9; BUN 16; Creatinine, Ser 0.80; Hemoglobin 12.8; Platelets 228.0; Potassium 4.6; Sodium 139    Lipid Panel    Component Value Date/Time    CHOL 158 06/23/2018 0940   TRIG 75.0 06/23/2018 0940   HDL 41.00 06/23/2018 0940   CHOLHDL 4 06/23/2018 0940   VLDL 15.0 06/23/2018 0940   LDLCALC 102 (H) 06/23/2018 0940      Wt Readings from Last 3 Encounters:  07/22/18 154 lb (69.9 kg)  06/23/18 154 lb 6 oz (70 kg)  08/13/17 147 lb 9.6 oz (67 kg)      PAD Screen 07/22/2018  Previous PAD dx? No  Previous surgical procedure? No  Pain with walking? No  Feet/toe relief with dangling? No  Painful, non-healing ulcers? No  Extremities discolored? No      ASSESSMENT AND PLAN:  1.  Vasovagal syncope: The episode she had on Mother's Day seems to be clearly vasovagal based on her description.  Although she does have first-degree AV block on EKG, she does not have any symptoms suggestive of symptomatic bradycardic events.  She has not had any recurrent episodes.  She has no other exertional symptoms and she has been exercising regularly with no limitations. Her cardiac physical exam is unremarkable and baseline EKG otherwise does not show ischemic changes.  Based on all of this, I do not think she requires further cardiac work-up at the present time and less she develops recurrent episodes and other symptoms.  2.  Essential hypertension: Blood pressures controlled on current medications.  I asked her to stop taking aspirin daily given that there is no role for primary prevention.    Disposition:   FU with me as needed.   Signed,  Lorine BearsMuhammad Arida, MD  07/22/2018 2:11 PM    [facility]

## 2018-07-26 ENCOUNTER — Other Ambulatory Visit: Payer: Self-pay | Admitting: Family

## 2018-07-26 DIAGNOSIS — I10 Essential (primary) hypertension: Secondary | ICD-10-CM

## 2018-07-26 DIAGNOSIS — E785 Hyperlipidemia, unspecified: Secondary | ICD-10-CM

## 2018-07-29 ENCOUNTER — Encounter: Payer: Self-pay | Admitting: Family

## 2018-08-07 ENCOUNTER — Ambulatory Visit: Payer: Self-pay

## 2018-08-18 ENCOUNTER — Ambulatory Visit: Payer: Self-pay

## 2018-09-04 ENCOUNTER — Other Ambulatory Visit: Payer: Self-pay | Admitting: Family

## 2018-09-04 DIAGNOSIS — I10 Essential (primary) hypertension: Secondary | ICD-10-CM

## 2018-10-13 ENCOUNTER — Telehealth: Payer: Self-pay | Admitting: Family

## 2018-10-13 NOTE — Telephone Encounter (Signed)
Call pt  Hope you are well.   Your mammogram appears due.   Please let me know once you have scheduled.      

## 2018-10-14 NOTE — Telephone Encounter (Signed)
Tried calling patient voicemail box full

## 2018-10-15 NOTE — Telephone Encounter (Signed)
Mail letter  Please mail letter-  Ms Heather Rodgers,   Hope you are well.   In reviewing your chart, it appears your are due for annual mammogram.You may already have scheduled- if so, you may disregard this letter.    Typically women have been using one of the sites below however you may go where you have been in the past. I would ensure that when you do get a mammogram that it is 3D ( as opposed to 2D which was prior technology). Evidence suggests that 3D is superior.   Please note that NOT all insurance companies cover 3D, and you may have to pay a higher copay. You may call your insurance company to further clarify your benefits.   Options for Mammogram in [place] :    [facility]  [address]  [place], [place]  [phone]   [facility] [address] [place], [place] [phone]   Let us know if you have questions.   My best,   Rennie PlowmanMargaret Arnett, NP

## 2018-10-16 NOTE — Telephone Encounter (Signed)
Letter mailed

## 2018-11-05 ENCOUNTER — Other Ambulatory Visit: Payer: Self-pay | Admitting: Family

## 2018-11-05 ENCOUNTER — Telehealth: Payer: Self-pay | Admitting: Family

## 2018-11-05 DIAGNOSIS — I10 Essential (primary) hypertension: Secondary | ICD-10-CM

## 2018-11-05 MED ORDER — TELMISARTAN 80 MG PO TABS: 80.0000 mg | ORAL_TABLET | Freq: Every day | ORAL | 5 refills | Status: DC

## 2018-11-05 NOTE — Telephone Encounter (Signed)
telmisartan 80 mg sent to pharmacy as altnerative to losartan

## 2018-11-05 NOTE — Telephone Encounter (Signed)
Please advise on medication change

## 2018-11-05 NOTE — Telephone Encounter (Signed)
Copied from CRM [id number]. Topic: Quick Communication - See Telephone Encounter >> Nov 05, 2018  4:49 PM Angela NevinWilliams, Candice N wrote: CRM for notification. See Telephone encounter for: 11/05/18.  Lennox LaityJodi from [facility] calling to state that there is a Surveyor[employer] minerals"national shortage" of the medication losartan (COZAAR) and would like to know if patient can be switched to another medication in the "sartan" family-specifically telmisartan or valsartan, as there is no current information available on when Losartan will become available again. Please advise.  Lennox LaityJodi cb# [phone]

## 2018-11-06 NOTE — Telephone Encounter (Signed)
Patient notified

## 2018-12-10 ENCOUNTER — Other Ambulatory Visit: Payer: Self-pay | Admitting: Family

## 2018-12-10 DIAGNOSIS — I10 Essential (primary) hypertension: Secondary | ICD-10-CM

## 2018-12-10 DIAGNOSIS — E785 Hyperlipidemia, unspecified: Secondary | ICD-10-CM

## 2018-12-10 NOTE — Telephone Encounter (Signed)
Copied from CRM [id number]. Topic: General - Other >> Dec 10, 2018 12:24 PM Leafy Ro wrote: Reason for PSU:[id number] with [facility] in [place] need new rx for this patient. Pt needs #90 pravastatin, losartan, telmisartan, amlodipine. Pt no longer goes to [facility] they closed down

## 2018-12-12 MED ORDER — AMLODIPINE BESYLATE 5 MG PO TABS: 5.0000 mg | ORAL_TABLET | Freq: Every day | ORAL | 1 refills | Status: DC

## 2018-12-12 MED ORDER — PRAVASTATIN SODIUM 40 MG PO TABS: 40.0000 mg | ORAL_TABLET | Freq: Every day | ORAL | 1 refills | Status: DC

## 2018-12-12 MED ORDER — LOSARTAN POTASSIUM 100 MG PO TABS: 100.0000 mg | ORAL_TABLET | Freq: Every day | ORAL | 0 refills | Status: DC

## 2018-12-12 MED ORDER — TELMISARTAN 80 MG PO TABS: 80.0000 mg | ORAL_TABLET | Freq: Every day | ORAL | 5 refills | Status: DC

## 2019-09-22 ENCOUNTER — Other Ambulatory Visit: Payer: Self-pay | Admitting: Family

## 2019-09-22 DIAGNOSIS — E785 Hyperlipidemia, unspecified: Secondary | ICD-10-CM

## 2019-09-22 DIAGNOSIS — I10 Essential (primary) hypertension: Secondary | ICD-10-CM

## 2019-11-30 ENCOUNTER — Other Ambulatory Visit: Payer: Self-pay | Admitting: Family

## 2020-01-12 ENCOUNTER — Telehealth: Payer: Self-pay | Admitting: Family

## 2020-01-12 ENCOUNTER — Other Ambulatory Visit: Payer: Self-pay

## 2020-01-12 DIAGNOSIS — E785 Hyperlipidemia, unspecified: Secondary | ICD-10-CM

## 2020-01-12 DIAGNOSIS — I10 Essential (primary) hypertension: Secondary | ICD-10-CM

## 2020-01-12 MED ORDER — PRAVASTATIN SODIUM 40 MG PO TABS: 40.0000 mg | ORAL_TABLET | Freq: Every day | ORAL | 1 refills | Status: DC

## 2020-01-12 MED ORDER — TELMISARTAN 80 MG PO TABS: 80.0000 mg | ORAL_TABLET | Freq: Every day | ORAL | 2 refills | Status: DC

## 2020-01-12 MED ORDER — AMLODIPINE BESYLATE 5 MG PO TABS: 5.0000 mg | ORAL_TABLET | Freq: Every day | ORAL | 1 refills | Status: DC

## 2020-01-12 NOTE — Telephone Encounter (Signed)
Medications sent to patient's pharmacy.

## 2020-01-12 NOTE — Telephone Encounter (Signed)
Pt needs a refill on the following sent to [facility] She scheduled an appt for 02/15/20  pravastatin (PRAVACHOL) 40 MG tablet amLODipine (NORVASC) 5 MG tablet telmisartan (MICARDIS) 80 MG tablet

## 2020-02-15 ENCOUNTER — Ambulatory Visit (INDEPENDENT_AMBULATORY_CARE_PROVIDER_SITE_OTHER): Payer: PPO | Admitting: Family

## 2020-02-15 ENCOUNTER — Encounter: Payer: Self-pay | Admitting: Family

## 2020-02-15 ENCOUNTER — Other Ambulatory Visit: Payer: Self-pay

## 2020-02-15 VITALS — BP 132/74 | HR 78 | Ht 62.5 in | Wt 156.0 lb

## 2020-02-15 DIAGNOSIS — I10 Essential (primary) hypertension: Secondary | ICD-10-CM | POA: Insufficient documentation

## 2020-02-15 DIAGNOSIS — E785 Hyperlipidemia, unspecified: Secondary | ICD-10-CM | POA: Insufficient documentation

## 2020-02-15 DIAGNOSIS — R809 Proteinuria, unspecified: Secondary | ICD-10-CM | POA: Insufficient documentation

## 2020-02-15 DIAGNOSIS — Z1231 Encounter for screening mammogram for malignant neoplasm of breast: Secondary | ICD-10-CM

## 2020-02-15 DIAGNOSIS — M1990 Unspecified osteoarthritis, unspecified site: Secondary | ICD-10-CM | POA: Insufficient documentation

## 2020-02-15 NOTE — Patient Instructions (Addendum)
Call [facility]- [phone] Please schedule bone density and mammogram at [facility].

## 2020-02-15 NOTE — Progress Notes (Signed)
Verbal consent for services obtained from patient prior to services given to TELEPHONE visit:   Location of call:  provider at work patient at home  Names of all persons present for services: [name], NP Feels well. No complaints.   HTN- today at home, 132/74, HR 78, Saso2 96%.  No cp, sob, palpitations.   HLD- compliant with pravachol.   [place]ed on [employer], 3 days week  Dr Kirke Corin 06/2018- fu as needed.   Sober - for 4 years.   A/P/next steps: Problem List Items Addressed This Visit      Cardiovascular and Mediastinum   Hypertension - Primary    Stable, continue current regimen      Relevant Orders   MM 3D SCREEN BREAST BILATERAL   DG Bone Density   CBC with Differential/Platelet   Comprehensive metabolic panel   Hemoglobin A1c   Lipid panel   Microalbumin / creatinine urine ratio     Other   Hyperlipidemia    Suspect Controlled, pending labs, continue current regimen for now.        Other Visit Diagnoses    Screening mammogram, encounter for       Relevant Orders   MM 3D SCREEN BREAST BILATERAL   DG Bone Density     Of note, patient will schedule mammogram and bone density  I spent 20 min  discussing plan of care over the phone.

## 2020-02-16 NOTE — Assessment & Plan Note (Addendum)
Suspect Controlled, pending labs, continue current regimen for now.

## 2020-02-16 NOTE — Assessment & Plan Note (Signed)
Stable, continue current regimen 

## 2020-03-07 ENCOUNTER — Other Ambulatory Visit: Payer: Self-pay | Admitting: Family

## 2020-03-07 ENCOUNTER — Other Ambulatory Visit: Payer: Self-pay

## 2020-03-07 ENCOUNTER — Other Ambulatory Visit (INDEPENDENT_AMBULATORY_CARE_PROVIDER_SITE_OTHER): Payer: PPO

## 2020-03-07 DIAGNOSIS — I10 Essential (primary) hypertension: Secondary | ICD-10-CM

## 2020-03-07 DIAGNOSIS — E785 Hyperlipidemia, unspecified: Secondary | ICD-10-CM

## 2020-03-07 LAB — HEMOGLOBIN A1C: Hgb A1c MFr Bld: 6 % (ref 4.6–6.5)

## 2020-03-07 LAB — CBC WITH DIFFERENTIAL/PLATELET
Basophils Absolute: 0 10*3/uL (ref 0.0–0.1)
Basophils Relative: 0.6 % (ref 0.0–3.0)
Eosinophils Absolute: 0.1 10*3/uL (ref 0.0–0.7)
Eosinophils Relative: 1.5 % (ref 0.0–5.0)
HCT: 38.4 % (ref 36.0–46.0)
Hemoglobin: 13.1 g/dL (ref 12.0–15.0)
Lymphocytes Relative: 14.1 % (ref 12.0–46.0)
Lymphs Abs: 1.1 10*3/uL (ref 0.7–4.0)
MCHC: 34 g/dL (ref 30.0–36.0)
MCV: 93.3 fl (ref 78.0–100.0)
Monocytes Absolute: 0.5 10*3/uL (ref 0.1–1.0)
Monocytes Relative: 6.5 % (ref 3.0–12.0)
Neutro Abs: 6.2 10*3/uL (ref 1.4–7.7)
Neutrophils Relative %: 77.3 % — ABNORMAL HIGH (ref 43.0–77.0)
Platelets: 247 10*3/uL (ref 150.0–400.0)
RBC: 4.12 Mil/uL (ref 3.87–5.11)
RDW: 13.5 % (ref 11.5–15.5)
WBC: 8 10*3/uL (ref 4.0–10.5)

## 2020-03-07 LAB — COMPREHENSIVE METABOLIC PANEL
ALT: 11 U/L (ref 0–35)
AST: 14 U/L (ref 0–37)
Albumin: 4.5 g/dL (ref 3.5–5.2)
Alkaline Phosphatase: 44 U/L (ref 39–117)
BUN: 9 mg/dL (ref 6–23)
CO2: 26 mEq/L (ref 19–32)
Calcium: 9.9 mg/dL (ref 8.4–10.5)
Chloride: 101 mEq/L (ref 96–112)
Creatinine, Ser: 0.74 mg/dL (ref 0.40–1.20)
GFR: 77.15 mL/min (ref >60.00)
Glucose, Bld: 101 mg/dL — ABNORMAL HIGH (ref 70–99)
Potassium: 4.5 mEq/L (ref 3.5–5.1)
Sodium: 133 mEq/L — ABNORMAL LOW (ref 135–145)
Total Bilirubin: 0.7 mg/dL (ref 0.2–1.2)
Total Protein: 7.4 g/dL (ref 6.0–8.3)

## 2020-03-07 LAB — LIPID PANEL
Cholesterol: 173 mg/dL (ref 0–200)
HDL: 38.7 mg/dL — ABNORMAL LOW (ref >39.00)
LDL Cholesterol: 115 mg/dL — ABNORMAL HIGH (ref 0–99)
NonHDL: 134.04
Total CHOL/HDL Ratio: 4
Triglycerides: 93 mg/dL (ref 0.0–149.0)
VLDL: 18.6 mg/dL (ref 0.0–40.0)

## 2020-03-07 NOTE — Addendum Note (Signed)
Addended by: Warden Fillers on: 03/07/2020 08:19 AM   Modules accepted: Orders

## 2020-03-08 ENCOUNTER — Other Ambulatory Visit: Payer: Self-pay

## 2020-03-08 MED ORDER — PRAVASTATIN SODIUM 20 MG PO TABS: 20.0000 mg | ORAL_TABLET | Freq: Every day | ORAL | 3 refills | Status: DC

## 2020-03-14 ENCOUNTER — Other Ambulatory Visit (INDEPENDENT_AMBULATORY_CARE_PROVIDER_SITE_OTHER): Payer: PPO

## 2020-03-14 ENCOUNTER — Other Ambulatory Visit: Payer: Self-pay

## 2020-03-14 DIAGNOSIS — I10 Essential (primary) hypertension: Secondary | ICD-10-CM

## 2020-03-14 DIAGNOSIS — E785 Hyperlipidemia, unspecified: Secondary | ICD-10-CM

## 2020-03-14 LAB — BASIC METABOLIC PANEL
BUN: 9 mg/dL (ref 6–23)
CO2: 27 mEq/L (ref 19–32)
Calcium: 9.9 mg/dL (ref 8.4–10.5)
Chloride: 99 mEq/L (ref 96–112)
Creatinine, Ser: 0.69 mg/dL (ref 0.40–1.20)
GFR: 83.63 mL/min (ref >60.00)
Glucose, Bld: 94 mg/dL (ref 70–99)
Potassium: 4.7 mEq/L (ref 3.5–5.1)
Sodium: 133 mEq/L — ABNORMAL LOW (ref 135–145)

## 2020-03-14 LAB — MICROALBUMIN / CREATININE URINE RATIO
Creatinine,U: 32.1 mg/dL
Microalb Creat Ratio: 2.4 mg/g (ref 0.0–30.0)
Microalb, Ur: 0.8 mg/dL (ref 0.0–1.9)

## 2020-03-22 ENCOUNTER — Other Ambulatory Visit: Payer: Self-pay

## 2020-03-22 ENCOUNTER — Other Ambulatory Visit: Payer: Self-pay | Admitting: Family

## 2020-03-22 DIAGNOSIS — E871 Hypo-osmolality and hyponatremia: Secondary | ICD-10-CM

## 2020-03-24 ENCOUNTER — Telehealth: Payer: Self-pay

## 2020-03-24 DIAGNOSIS — Z87891 Personal history of nicotine dependence: Secondary | ICD-10-CM

## 2020-03-24 DIAGNOSIS — Z122 Encounter for screening for malignant neoplasm of respiratory organs: Secondary | ICD-10-CM

## 2020-03-24 NOTE — Telephone Encounter (Signed)
Patient has been notified that the low dose lung cancer screening CT scan is due currently or will be in near future.  Confirmed that patient is within the appropriate age range and asymptomatic, (no signs or symptoms of lung cancer).  Patient denies illness that would prevent curative treatment for lung cancer if found.  Patient is agreeable for CT scan being scheduled.    Verified smoking history (current smoker, with 48 year history, now 1 pack per week).   Appointment preference is for a Mon, Tu, Wed morning appointment in June.

## 2020-03-25 NOTE — Telephone Encounter (Signed)
Smoking history: current, 48 pack year 

## 2020-03-25 NOTE — Addendum Note (Signed)
Addended by: Jonne Ply on: 03/25/2020 03:11 PM   Modules accepted: Orders

## 2020-04-13 ENCOUNTER — Encounter: Payer: Self-pay | Admitting: Endocrinology

## 2020-04-13 ENCOUNTER — Other Ambulatory Visit: Payer: Self-pay

## 2020-04-13 ENCOUNTER — Telehealth: Payer: Self-pay

## 2020-04-13 ENCOUNTER — Ambulatory Visit: Payer: PPO | Admitting: Endocrinology

## 2020-04-13 DIAGNOSIS — E871 Hypo-osmolality and hyponatremia: Secondary | ICD-10-CM

## 2020-04-13 LAB — URINALYSIS, ROUTINE W REFLEX MICROSCOPIC
Bilirubin Urine: NEGATIVE
Hgb urine dipstick: NEGATIVE
Ketones, ur: NEGATIVE
Nitrite: NEGATIVE
RBC / HPF: NONE SEEN (ref 0–?)
Specific Gravity, Urine: 1.01 (ref 1.000–1.030)
Total Protein, Urine: NEGATIVE
Urine Glucose: NEGATIVE
Urobilinogen, UA: 0.2 (ref 0.0–1.0)
pH: 7.5 (ref 5.0–8.0)

## 2020-04-13 LAB — BASIC METABOLIC PANEL
BUN: 9 mg/dL (ref 6–23)
CO2: 27 mEq/L (ref 19–32)
Calcium: 9.9 mg/dL (ref 8.4–10.5)
Chloride: 102 mEq/L (ref 96–112)
Creatinine, Ser: 0.8 mg/dL (ref 0.40–1.20)
GFR: 70.49 mL/min (ref >60.00)
Glucose, Bld: 90 mg/dL (ref 70–99)
Potassium: 4.3 mEq/L (ref 3.5–5.1)
Sodium: 137 mEq/L (ref 135–145)

## 2020-04-13 LAB — CORTISOL
Cortisol, Plasma: 28.6 ug/dL
Cortisol, Plasma: 6.4 ug/dL

## 2020-04-13 MED ORDER — COSYNTROPIN 0.25 MG IJ SOLR
0.2500 mg | Freq: Once | INTRAMUSCULAR | Status: AC
  Administered 2020-04-13: 0.25 mg via INTRAMUSCULAR

## 2020-04-13 NOTE — Telephone Encounter (Signed)
Refer to telephone encounter.  LAB RESULTS  Lab results were reviewed by Dr. Everardo All. Called pt to inform about lab results as well as further advice. Mailbox full - not accepting new messages.

## 2020-04-13 NOTE — Patient Instructions (Addendum)
Blood tests are requested for you today.  We'll let you know about the results.  Based on the results, I may want to prescribe a pill to increase the sodium level.   You should try to limits how much fluid you drink, although this can be difficult.  The best way is to avoid drinking during meals. You do not need to limit salt.   Please come back for a follow-up appointment in 2 months.

## 2020-04-13 NOTE — Telephone Encounter (Signed)
-----   Message from Romero Belling, MD sent at 04/13/2020  4:44 PM EDT ----- please contact patient: Sodium is back to normal.  The other tests are normal, except you have a slight UTI.  If you have no symptoms, it needs no treatment.  If you have symptoms, please let me know.

## 2020-04-13 NOTE — Progress Notes (Signed)
Subjective:    Patient ID: Heather Rodgers, female    DOB: 10-19-1946, 74 y.o.   MRN: [id number]  HPI Pt is referred by Mable Paris, NP, for hyponatremia.  Pt was first noted to have hyponatremia in early 2021.  Denies the following: ACEI, duiretic, adrenal insuff, CHF, hypothyroid, nephrotic synd, chronic pain, brain injury, pseudo, psychogenic polydipsia, pulm dz, chemotx, narcotics, SSRI, GBS.  Cirrhosis was noted on CT.   Past Medical History:  Diagnosis Date  . Alcohol abuse 2017   rehab 11/2015  . Anemia   . Arthritis   . Colitis 2017   c.difficile  . Hypertension   . Kidney stones     Past Surgical History:  Procedure Laterality Date  . NO PAST SURGERIES      Social History   Socioeconomic History  . Marital status: Widowed    Spouse name: Not on file  . Number of children: Not on file  . Years of education: Not on file  . Highest education level: Not on file  Occupational History  . Not on file  Tobacco Use  . Smoking status: Current Some Day Smoker    Packs/day: 1.50    Years: 47.00    Pack years: 70.50    Types: Cigarettes    Start date: 09/19/1995    Last attempt to quit: 12/01/2015    Years since quitting: 4.3  . Smokeless tobacco: Never Used  Substance and Sexual Activity  . Alcohol use: No    Alcohol/week: 0.0 standard drinks    Comment: sober since late December 2016  . Drug use: No  . Sexual activity: Never  Other Topics Concern  . Not on file  Social History Narrative   Widower   Employed on golf course, 3 days week   Some College   Caffeine- 1 cup of coffee daily and no soda or tea   3 children   Social Determinants of Health   Financial Resource Strain:   . Difficulty of Paying Living Expenses:   Food Insecurity:   . Worried About Charity fundraiser in the Last Year:   . Arboriculturist in the Last Year:   Transportation Needs:   . Film/video editor (Medical):   Marland Kitchen Lack of Transportation (Non-Medical):     Physical Activity:   . Days of Exercise per Week:   . Minutes of Exercise per Session:   Stress:   . Feeling of Stress :   Social Connections:   . Frequency of Communication with Friends and Family:   . Frequency of Social Gatherings with Friends and Family:   . Attends Religious Services:   . Active Member of Clubs or Organizations:   . Attends Archivist Meetings:   Marland Kitchen Marital Status:   Intimate Partner Violence:   . Fear of Current or Ex-Partner:   . Emotionally Abused:   Marland Kitchen Physically Abused:   . Sexually Abused:     Current Outpatient Medications on File Prior to Visit  Medication Sig Dispense Refill  . amLODipine (NORVASC) 5 MG tablet Take 1 tablet (5 mg total) by mouth daily. 90 tablet 1  . cetirizine (ZYRTEC) 10 MG tablet Take 10 mg by mouth daily.    . Cholecalciferol (D 5000) 5000 UNITS TABS Take 1 tablet by mouth daily.    . Glucosamine-MSM-Hyaluronic Acd (JOINT HEALTH PO) Take 1 tablet by mouth.    . pravastatin (PRAVACHOL) 20 MG tablet Take 1 tablet (20 mg  total) by mouth daily. 90 tablet 3  . pravastatin (PRAVACHOL) 40 MG tablet Take 1 tablet (40 mg total) by mouth daily. 90 tablet 1  . telmisartan (MICARDIS) 80 MG tablet Take 1 tablet (80 mg total) by mouth daily. 30 tablet 2   No current facility-administered medications on file prior to visit.    Allergies  Allergen Reactions  . Penicillins Hives    Family History  Problem Relation Age of Onset  . Heart disease Mother   . Hypertension Mother   . Arthritis Father   . Heart disease Father   . Hypertension Father   . Kidney disease Neg Hx     BP 120/88   Pulse (!) 59   Ht 5' 2.5" (1.588 m)   Wt 153 lb (69.4 kg)   SpO2 98%   BMI 27.54 kg/m   Review of Systems Denies weight change, numbness, visual loss, sob, n/v, polyuria, diarrhea, open wound, memory loss, and headache.     Objective:   Physical Exam VS: see vs page GEN: no distress HEAD: head: no deformity eyes: no periorbital  swelling, no proptosis external nose and ears are normal NECK: supple, thyroid is not enlarged CHEST WALL: no deformity LUNGS: clear to auscultation CV: reg rate and rhythm, no murmur.  MUSCULOSKELETAL: muscle bulk and strength are grossly normal.  no obvious joint swelling.  gait is normal and steady.   EXTEMITIES: no deformity.  no leg edema, but there are bilat VV's.   PULSES: no carotid bruit NEURO:  cn 2-12 grossly intact.   readily moves all 4's.  sensation is intact to touch on all 4's SKIN:  Normal texture and temperature.  No rash or suspicious lesion is visible.   NODES:  None palpable at the neck PSYCH: alert, well-oriented.  Does not appear anxious nor depressed.   MRI brain (2017): no mention is made of the pituitary.  CT chest (2018): 1. Lung-RADS Category 2S, benign appearance or behavior. Continue annual screening with low-dose chest CT without contrast in 12 months. 2. The "S" modifier represents a potentially clinically significant non pulmonary finding. Moderate cirrhosis, incompletely imaged. 3.  Coronary artery atherosclerosis. Aortic atherosclerosis.  Lab Results  Component Value Date   CREATININE 0.69 03/14/2020   BUN 9 03/14/2020   NA 133 (L) 03/14/2020   K 4.7 03/14/2020   CL 99 03/14/2020   CO2 27 03/14/2020   Lab Results  Component Value Date   CHOL 173 03/07/2020   HDL 38.70 (L) 03/07/2020   LDLCALC 115 (H) 03/07/2020   TRIG 93.0 03/07/2020   CHOLHDL 4 03/07/2020   ACTH stimulation test is done: baseline cortisol level=6 then Cosyntropin 250 mcg is given im 45 minutes later, cortisol level=29 (normal response)  I have reviewed outside records, and summarized: Pt was noted to have hyponatremia, and referred here.  HTN, dyslipidemia, and long-term sobriety were also addressed      Assessment & Plan:  Hyponatremia, new to me.  Cirrhosis is the only cause identified today.  Better on recheck, but she is at risk for recurrence.   You should  try to limit how much fluid you drink, although this can be difficult.  The best way is to avoid drinking during meals. You do not need to limit salt.   Please come back for a follow-up appointment in 2 months.

## 2020-04-13 NOTE — Progress Notes (Signed)
Pt presents today for NP appt hyponatremia. Per order from Dr. Everardo All, injection of Cortrosyn 0.25mg  given IM in L deltoid today by A. Kassidy Frankson, LPN. Denies any adverse reactions or discomfort.

## 2020-04-14 NOTE — Telephone Encounter (Signed)
LAB RESULTS  Lab results were reviewed by Dr. Ellison. A letter has been mailed to pt home address. For future reference, letter can be found in Epic. 

## 2020-04-14 NOTE — Telephone Encounter (Signed)
FINAL ATTEMPT:  Again called pt to inform about results and orders. Mailbox remains full and not able to accept new messages.

## 2020-04-14 NOTE — Telephone Encounter (Signed)
SECOND ATTEMPT:  Called pt and the phone rang 1 and 1 1/2 times, then immediately received an automated voice indicating VM is full

## 2020-04-18 ENCOUNTER — Other Ambulatory Visit (INDEPENDENT_AMBULATORY_CARE_PROVIDER_SITE_OTHER): Payer: PPO

## 2020-04-18 ENCOUNTER — Other Ambulatory Visit: Payer: Self-pay

## 2020-04-18 DIAGNOSIS — E785 Hyperlipidemia, unspecified: Secondary | ICD-10-CM | POA: Diagnosis not present

## 2020-04-18 LAB — COMPREHENSIVE METABOLIC PANEL
ALT: 9 U/L (ref 0–35)
AST: 11 U/L (ref 0–37)
Albumin: 4.3 g/dL (ref 3.5–5.2)
Alkaline Phosphatase: 41 U/L (ref 39–117)
BUN: 11 mg/dL (ref 6–23)
CO2: 28 mEq/L (ref 19–32)
Calcium: 9.6 mg/dL (ref 8.4–10.5)
Chloride: 106 mEq/L (ref 96–112)
Creatinine, Ser: 0.78 mg/dL (ref 0.40–1.20)
GFR: 72.57 mL/min (ref >60.00)
Glucose, Bld: 83 mg/dL (ref 70–99)
Potassium: 4.3 mEq/L (ref 3.5–5.1)
Sodium: 140 mEq/L (ref 135–145)
Total Bilirubin: 0.6 mg/dL (ref 0.2–1.2)
Total Protein: 6.8 g/dL (ref 6.0–8.3)

## 2020-04-21 LAB — ARGININE VASOPRESSIN HORMONE
ADH: 1 pg/mL (ref 0.0–4.7)
Osmolality Meas: 281 mOsmol/kg (ref 280–301)

## 2020-04-26 ENCOUNTER — Ambulatory Visit: Admission: RE | Admit: 2020-04-26 | Payer: PPO | Source: Ambulatory Visit

## 2020-05-20 ENCOUNTER — Telehealth: Payer: Self-pay | Admitting: *Deleted

## 2020-05-20 NOTE — Telephone Encounter (Signed)
Attempted to notify patient that her Low Dose Lung Cancer Screening Ct is due. Her Mailbox is full and I could not leave a message.

## 2020-05-24 ENCOUNTER — Other Ambulatory Visit: Payer: Self-pay | Admitting: Family

## 2020-05-24 ENCOUNTER — Telehealth: Payer: Self-pay | Admitting: *Deleted

## 2020-05-24 NOTE — Telephone Encounter (Signed)
(  05/24/2020) Unable to leave message for pt to notify them that it is time to schedule annual low dose lung cancer screening CT scan. Will call back to verify information prior to the scan being scheduled SRW

## 2020-06-13 ENCOUNTER — Ambulatory Visit (INDEPENDENT_AMBULATORY_CARE_PROVIDER_SITE_OTHER): Payer: PPO

## 2020-06-13 VITALS — Ht 62.25 in | Wt 153.0 lb

## 2020-06-13 DIAGNOSIS — Z0000 Encounter for general adult medical examination without abnormal findings: Secondary | ICD-10-CM

## 2020-06-13 NOTE — Patient Instructions (Addendum)
Heather Rodgers , Thank you for taking time to come for your Medicare Wellness Visit. I appreciate your ongoing commitment to your health goals. Please review the following plan we discussed and let me know if I can assist you in the future.   These are the goals we discussed: Goals      Patient Stated     Increase physical activity (pt-stated)      I want to start going back to the gym      Other     Quit smoking / using tobacco       This is a list of the screening recommended for you and due dates:  Health Maintenance  Topic Date Due   Mammogram  09/04/2019   COVID-19 Vaccine (1) 06/29/2020*   DEXA scan (bone density measurement)  06/13/2021*   Tetanus Vaccine  05/10/2024   Colon Cancer Screening  07/12/2024    Hepatitis C: One time screening is recommended by Center for Disease Control  (CDC) for  adults born from [birth year] through 1963.   Completed   Pneumonia vaccines  Completed  *Topic was postponed. The date shown is not the original due date.    Immunizations Immunization History  Administered Date(s) Administered   Influenza, High Dose Seasonal PF 08/22/2016, 08/13/2017   Influenza-Unspecified 09/21/2015   Pneumococcal Conjugate-13 01/02/2017   Pneumococcal Polysaccharide-23 04/22/2013   Pneumococcal-Unspecified 04/05/2014   Td 05/10/2014   Keep all routine maintenance appointments.   Follow up 08/22/20 @ 8:00  Advanced directives: End of life planning; Advance aging; Advanced directives discussed.  Copy of current HCPOA/Living Will requested.    Conditions/risks identified: none new  Follow up in one year for your annual wellness visit.   Preventive Care 33 Years and Older, Female Preventive care refers to lifestyle choices and visits with your health care provider that can promote health and wellness. What does preventive care include?  A yearly physical exam. This is also called an annual well check.  Dental exams once or twice a  year.  Routine eye exams. Ask your health care provider how often you should have your eyes checked.  Personal lifestyle choices, including:  Daily care of your teeth and gums.  Regular physical activity.  Eating a healthy diet.  Avoiding tobacco and drug use.  Limiting alcohol use.  Practicing safe sex.  Taking low-dose aspirin every day.  Taking vitamin and mineral supplements as recommended by your health care provider. What happens during an annual well check? The services and screenings done by your health care provider during your annual well check will depend on your age, overall health, lifestyle risk factors, and family history of disease. Counseling  Your health care provider may ask you questions about your:  Alcohol use.  Tobacco use.  Drug use.  Emotional well-being.  Home and relationship well-being.  Sexual activity.  Eating habits.  History of falls.  Memory and ability to understand (cognition).  Work and work Astronomer.  Reproductive health. Screening  You may have the following tests or measurements:  Height, weight, and BMI.  Blood pressure.  Lipid and cholesterol levels. These may be checked every 5 years, or more frequently if you are over 50 years old.  Skin check.  Lung cancer screening. You may have this screening every year starting at age 35 if you have a 30-pack-year history of smoking and currently smoke or have quit within the past 15 years.  Fecal occult blood test (FOBT) of the stool. You may  have this test every year starting at age 77.  Flexible sigmoidoscopy or colonoscopy. You may have a sigmoidoscopy every 5 years or a colonoscopy every 10 years starting at age 58.  Hepatitis C blood test.  Hepatitis B blood test.  Sexually transmitted disease (STD) testing.  Diabetes screening. This is done by checking your blood sugar (glucose) after you have not eaten for a while (fasting). You may have this done every 1-3  years.  Bone density scan. This is done to screen for osteoporosis. You may have this done starting at age 64.  Mammogram. This may be done every 1-2 years. Talk to your health care provider about how often you should have regular mammograms. Talk with your health care provider about your test results, treatment options, and if necessary, the need for more tests. Vaccines  Your health care provider may recommend certain vaccines, such as:  Influenza vaccine. This is recommended every year.  Tetanus, diphtheria, and acellular pertussis (Tdap, Td) vaccine. You may need a Td booster every 10 years.  Zoster vaccine. You may need this after age 63.  Pneumococcal 13-valent conjugate (PCV13) vaccine. One dose is recommended after age 58.  Pneumococcal polysaccharide (PPSV23) vaccine. One dose is recommended after age 51. Talk to your health care provider about which screenings and vaccines you need and how often you need them. This information is not intended to replace advice given to you by your health care provider. Make sure you discuss any questions you have with your health care provider. Document Released: 12/09/2015 Document Revised: 08/01/2016 Document Reviewed: 09/13/2015 Elsevier Interactive Patient Education  2017 [employer].  Fall Prevention in the Home Falls can cause injuries. They can happen to people of all ages. There are many things you can do to make your home safe and to help prevent falls. What can I do on the outside of my home?  Regularly fix the edges of walkways and driveways and fix any cracks.  Remove anything that might make you trip as you walk through a door, such as a raised step or threshold.  Trim any bushes or trees on the path to your home.  Use bright outdoor lighting.  Clear any walking paths of anything that might make someone trip, such as rocks or tools.  Regularly check to see if handrails are loose or broken. Make sure that both sides of any  steps have handrails.  Any raised decks and porches should have guardrails on the edges.  Have any leaves, snow, or ice cleared regularly.  Use sand or salt on walking paths during winter.  Clean up any spills in your garage right away. This includes oil or grease spills. What can I do in the bathroom?  Use night lights.  Install grab bars by the toilet and in the tub and shower. Do not use towel bars as grab bars.  Use non-skid mats or decals in the tub or shower.  If you need to sit down in the shower, use a plastic, non-slip stool.  Keep the floor dry. Clean up any water that spills on the floor as soon as it happens.  Remove soap buildup in the tub or shower regularly.  Attach bath mats securely with double-sided non-slip rug tape.  Do not have throw rugs and other things on the floor that can make you trip. What can I do in the bedroom?  Use night lights.  Make sure that you have a light by your bed that is easy  to reach.  Do not use any sheets or blankets that are too big for your bed. They should not hang down onto the floor.  Have a firm chair that has side arms. You can use this for support while you get dressed.  Do not have throw rugs and other things on the floor that can make you trip. What can I do in the kitchen?  Clean up any spills right away.  Avoid walking on wet floors.  Keep items that you use a lot in easy-to-reach places.  If you need to reach something above you, use a strong step stool that has a grab bar.  Keep electrical cords out of the way.  Do not use floor polish or wax that makes floors slippery. If you must use wax, use non-skid floor wax.  Do not have throw rugs and other things on the floor that can make you trip. What can I do with my stairs?  Do not leave any items on the stairs.  Make sure that there are handrails on both sides of the stairs and use them. Fix handrails that are broken or loose. Make sure that handrails are  as long as the stairways.  Check any carpeting to make sure that it is firmly attached to the stairs. Fix any carpet that is loose or worn.  Avoid having throw rugs at the top or bottom of the stairs. If you do have throw rugs, attach them to the floor with carpet tape.  Make sure that you have a light switch at the top of the stairs and the bottom of the stairs. If you do not have them, ask someone to add them for you. What else can I do to help prevent falls?  Wear shoes that:  Do not have high heels.  Have rubber bottoms.  Are comfortable and fit you well.  Are closed at the toe. Do not wear sandals.  If you use a stepladder:  Make sure that it is fully opened. Do not climb a closed stepladder.  Make sure that both sides of the stepladder are locked into place.  Ask someone to hold it for you, if possible.  Clearly mark and make sure that you can see:  Any grab bars or handrails.  First and last steps.  Where the edge of each step is.  Use tools that help you move around (mobility aids) if they are needed. These include:  Canes.  Walkers.  Scooters.  Crutches.  Turn on the lights when you go into a dark area. Replace any light bulbs as soon as they burn out.  Set up your furniture so you have a clear path. Avoid moving your furniture around.  If any of your floors are uneven, fix them.  If there are any pets around you, be aware of where they are.  Review your medicines with your doctor. Some medicines can make you feel dizzy. This can increase your chance of falling. Ask your doctor what other things that you can do to help prevent falls. This information is not intended to replace advice given to you by your health care provider. Make sure you discuss any questions you have with your health care provider. Document Released: 09/08/2009 Document Revised: 04/19/2016 Document Reviewed: 12/17/2014 Elsevier Interactive Patient Education  2017 [employer].

## 2020-06-13 NOTE — Progress Notes (Addendum)
Subjective:   Heather Rodgers is a 74 y.o. female who presents for Medicare Annual (Subsequent) preventive examination.  Review of Systems    No ROS.  Medicare Wellness Virtual Visit.   Cardiac Risk Factors include: advanced age (>44men, >42 women);hypertension     Objective:    Today's Vitals   06/13/20 1337  Weight: 153 lb (69.4 kg)  Height: 5' 2.25" (1.581 m)   Body mass index is 27.76 kg/m.  Advanced Directives 06/13/2020 08/06/2017 12/02/2015 12/02/2015 12/01/2015 11/29/2015  Does Patient Have a Medical Advance Directive? Yes Yes No No No No  Type of Advance Directive Healthcare Power of [employer] Power of Attorney - - - -  Does patient want to make changes to medical advance directive? No - Patient declined No - Patient declined - - - -  Copy of Healthcare Power of Attorney in Chart? No - copy requested No - copy requested - - - -  Would patient like information on creating a medical advance directive? - - Yes - Educational materials given Yes - Educational materials given No - patient declined information No - patient declined information    Current Medications (verified) Outpatient Encounter Medications as of 06/13/2020  Medication Sig  . amLODipine (NORVASC) 5 MG tablet Take 1 tablet (5 mg total) by mouth daily.  . cetirizine (ZYRTEC) 10 MG tablet Take 10 mg by mouth daily.  . Cholecalciferol (D 5000) 5000 UNITS TABS Take 1 tablet by mouth daily.  . Glucosamine-MSM-Hyaluronic Acd (JOINT HEALTH PO) Take 1 tablet by mouth.  . pravastatin (PRAVACHOL) 20 MG tablet Take 1 tablet (20 mg total) by mouth daily.  . pravastatin (PRAVACHOL) 40 MG tablet Take 1 tablet (40 mg total) by mouth daily.  Marland Kitchen telmisartan (MICARDIS) 80 MG tablet TAKE 1 TABLET BY MOUTH DAILY   No facility-administered encounter medications on file as of 06/13/2020.    Allergies (verified) Penicillins   History: Past Medical History:  Diagnosis Date  . Alcohol abuse 2017   rehab 11/2015    . Anemia   . Arthritis   . Colitis 2017   c.difficile  . Hypertension   . Kidney stones    Past Surgical History:  Procedure Laterality Date  . NO PAST SURGERIES     Family History  Problem Relation Age of Onset  . Heart disease Mother   . Hypertension Mother   . Arthritis Father   . Heart disease Father   . Hypertension Father   . Kidney disease Neg Hx    Social History   Socioeconomic History  . Marital status: Widowed    Spouse name: Not on file  . Number of children: Not on file  . Years of education: Not on file  . Highest education level: Not on file  Occupational History  . Not on file  Tobacco Use  . Smoking status: Current Some Day Smoker    Packs/day: 1.50    Years: 47.00    Pack years: 70.50    Types: Cigarettes    Start date: 09/19/1995    Last attempt to quit: 12/01/2015    Years since quitting: 4.5  . Smokeless tobacco: Never Used  Substance and Sexual Activity  . Alcohol use: No    Alcohol/week: 0.0 standard drinks    Comment: sober since late December 2016  . Drug use: No  . Sexual activity: Never  Other Topics Concern  . Not on file  Social History Narrative   Widower   Employed  on golf course, 3 days week   Some College   Caffeine- 1 cup of coffee daily and no soda or tea   3 children   Social Determinants of Health   Financial Resource Strain:   . Difficulty of Paying Living Expenses:   Food Insecurity:   . Worried About Programme researcher, broadcasting/film/video in the Last Year:   . Barista in the Last Year:   Transportation Needs:   . Freight forwarder (Medical):   Marland Kitchen Lack of Transportation (Non-Medical):   Physical Activity:   . Days of Exercise per Week:   . Minutes of Exercise per Session:   Stress:   . Feeling of Stress :   Social Connections:   . Frequency of Communication with Friends and Family:   . Frequency of Social Gatherings with Friends and Family:   . Attends Religious Services:   . Active Member of Clubs or  Organizations:   . Attends Banker Meetings:   Marland Kitchen Marital Status:     Tobacco Counseling Ready to quit: Not Answered Counseling given: Not Answered   Clinical Intake:  Pre-visit preparation completed: Yes        Diabetes: No  How often do you need to have someone help you when you read instructions, pamphlets, or other written materials from your doctor or pharmacy?: 1 - Never   Interpreter Needed?: No      Activities of Daily Living In your present state of health, do you have any difficulty performing the following activities: 06/13/2020 02/15/2020  Hearing? N N  Vision? N N  Difficulty concentrating or making decisions? N N  Walking or climbing stairs? N N  Dressing or bathing? N N  Doing errands, shopping? N N  Preparing Food and eating ? N -  Using the Toilet? N -  In the past six months, have you accidently leaked urine? N -  Do you have problems with loss of bowel control? N -  Managing your Medications? N -  Managing your Finances? N -  Housekeeping or managing your Housekeeping? N -  Some recent data might be hidden    Patient Care Team: Allegra Grana, FNP as PCP - General (Family Medicine)  Indicate any recent Medical Services you may have received from other than [facility] providers in the past year (date may be approximate).     Assessment:   This is a routine wellness examination for Heather Rodgers.  I connected with Heather Rodgers today by telephone and verified that I am speaking with the correct person using two identifiers. Location patient: home Location provider: work Persons participating in the virtual visit: patient, Engineer, civil (consulting).    I discussed the limitations, risks, security and privacy concerns of performing an evaluation and management service by telephone and the availability of in person appointments. The patient expressed understanding and verbally consented to this telephonic visit.    Interactive audio and video telecommunications were  attempted between this provider and patient, however failed, due to patient having technical difficulties OR patient did not have access to video capability.  We continued and completed visit with audio only.  Some vital signs may be absent or patient reported.   Hearing/Vision screen  Hearing Screening   125Hz  250Hz  500Hz  1000Hz  2000Hz  3000Hz  4000Hz  6000Hz  8000Hz   Right ear:           Left ear:           Comments: Patient is able to hear conversational tones  without difficulty.  No issues reported.  Vision Screening Comments: Followed by Dr. Larence PenningNice Wears corrective lenses Cataract extraction, bilateral Visual acuity not assessed, virtual visit.  They have seen their ophthalmologist in the last 12 months.     Dietary issues and exercise activities discussed: Current Exercise Habits: Home exercise routine, Type of exercise: walking, Frequency (Times/Week): 3, Intensity: Mild  Goals      Patient Stated   .  Increase physical activity (pt-stated)      I want to start going back to the gym      Other   .  Quit smoking / using tobacco      Depression Screen PHQ 2/9 Scores 06/13/2020 02/15/2020 08/06/2017 01/02/2017 08/24/2016 11/16/2015  PHQ - 2 Score 0 0 0 0 0 1    Fall Risk Fall Risk  06/13/2020 02/15/2020 08/06/2017 01/02/2017 08/24/2016  Falls in the past year? 0 0 No No No  Number falls in past yr: 0 - - - -  Injury with Fall? - - - - -  Risk for fall due to : - - - - -  Follow up Falls evaluation completed Falls evaluation completed - - -   Handrails in use when climbing stairs? Yes  Home free of loose throw rugs in walkways, pet beds, electrical cords, etc? Yes  Adequate lighting in your home to reduce risk of falls? Yes   ASSISTIVE DEVICES UTILIZED TO PREVENT FALLS: Use of a cane, walker or w/c? No  Grab bars in the bathroom? No  Shower chair or bench in shower? No  Elevated toilet seat or a handicapped toilet? No   TIMED UP AND GO: Was the test performed? No . Virtual  visit.  Cognitive Function: MMSE - Mini Mental State Exam 08/06/2017  Orientation to time 5  Orientation to Place 5  Registration 3  Attention/ Calculation 5  Recall 3  Language- name 2 objects 2  Language- repeat 1  Language- follow 3 step command 3  Language- read & follow direction 1  Write a sentence 1  Copy design 1  Total score 30     6CIT Screen 06/13/2020  What Year? 0 points  What month? 0 points  What time? 0 points   Immunizations Immunization History  Administered Date(s) Administered  . Influenza, High Dose Seasonal PF 08/22/2016, 08/13/2017  . Influenza-Unspecified 09/21/2015  . Pneumococcal Conjugate-13 01/02/2017  . Pneumococcal Polysaccharide-23 04/22/2013  . Pneumococcal-Unspecified 04/05/2014  . Td 05/10/2014   Health Maintenance Health Maintenance  Topic Date Due  . MAMMOGRAM  09/04/2019  . COVID-19 Vaccine (1) 06/29/2020 (Originally 03/07/1958)  . DEXA SCAN  06/13/2021 (Originally 03/07/2013)  . TETANUS/TDAP  05/10/2024  . COLONOSCOPY  07/12/2024  . Hepatitis C Screening  Completed  . PNA vac Low Risk Adult  Completed   Dexa Scan- Deferred per patient.  Mammogram- ordered 02/15/20. She plans to schedule within the year.   Dental Screening: Recommended annual dental exams for proper oral hygiene  Community Resource Referral / Chronic Care Management: CRR required this visit?  No   CCM required this visit?  No     Plan:   Keep all routine maintenance appointments.   Follow up 08/22/20 @ 8:00  I have personally reviewed and noted the following in the patient's chart:   . Medical and social history . Use of alcohol, tobacco or illicit drugs  . Current medications and supplements . Functional ability and status . Nutritional status . Physical activity . Advanced directives .  List of other physicians . Hospitalizations, surgeries, and ER visits in previous 12 months . Vitals . Screenings to include cognitive, depression, and  falls . Referrals and appointments  In addition, I have reviewed and discussed with patient certain preventive protocols, quality metrics, and best practice recommendations. A written personalized care plan for preventive services as well as general preventive health recommendations were provided to patient via mail.     Ashok Pall, LPN   [id number]/[id number]     Agree with plan. [name], NP

## 2020-06-14 ENCOUNTER — Ambulatory Visit: Payer: PPO | Admitting: Endocrinology

## 2020-06-14 DIAGNOSIS — Z0289 Encounter for other administrative examinations: Secondary | ICD-10-CM

## 2020-08-22 ENCOUNTER — Other Ambulatory Visit: Payer: Self-pay

## 2020-08-22 ENCOUNTER — Ambulatory Visit (INDEPENDENT_AMBULATORY_CARE_PROVIDER_SITE_OTHER): Payer: PPO | Admitting: Family

## 2020-08-22 ENCOUNTER — Encounter: Payer: Self-pay | Admitting: Family

## 2020-08-22 VITALS — BP 140/80 | HR 60 | Temp 97.9°F | Ht 62.5 in | Wt 149.2 lb

## 2020-08-22 DIAGNOSIS — Z13820 Encounter for screening for osteoporosis: Secondary | ICD-10-CM

## 2020-08-22 DIAGNOSIS — E785 Hyperlipidemia, unspecified: Secondary | ICD-10-CM | POA: Diagnosis not present

## 2020-08-22 DIAGNOSIS — I10 Essential (primary) hypertension: Secondary | ICD-10-CM

## 2020-08-22 DIAGNOSIS — K7030 Alcoholic cirrhosis of liver without ascites: Secondary | ICD-10-CM | POA: Diagnosis not present

## 2020-08-22 DIAGNOSIS — Z23 Encounter for immunization: Secondary | ICD-10-CM

## 2020-08-22 LAB — LIPID PANEL
Cholesterol: 174 mg/dL (ref 0–200)
HDL: 46.9 mg/dL (ref >39.00)
LDL Cholesterol: 109 mg/dL — ABNORMAL HIGH (ref 0–99)
NonHDL: 127.15
Total CHOL/HDL Ratio: 4
Triglycerides: 89 mg/dL (ref 0.0–149.0)
VLDL: 17.8 mg/dL (ref 0.0–40.0)

## 2020-08-22 LAB — CBC WITH DIFFERENTIAL/PLATELET
Basophils Absolute: 0.1 10*3/uL (ref 0.0–0.1)
Basophils Relative: 0.9 % (ref 0.0–3.0)
Eosinophils Absolute: 0.1 10*3/uL (ref 0.0–0.7)
Eosinophils Relative: 1.6 % (ref 0.0–5.0)
HCT: 39 % (ref 36.0–46.0)
Hemoglobin: 13.1 g/dL (ref 12.0–15.0)
Lymphocytes Relative: 17.8 % (ref 12.0–46.0)
Lymphs Abs: 1.4 10*3/uL (ref 0.7–4.0)
MCHC: 33.5 g/dL (ref 30.0–36.0)
MCV: 96.5 fl (ref 78.0–100.0)
Monocytes Absolute: 0.5 10*3/uL (ref 0.1–1.0)
Monocytes Relative: 6.2 % (ref 3.0–12.0)
Neutro Abs: 5.9 10*3/uL (ref 1.4–7.7)
Neutrophils Relative %: 73.5 % (ref 43.0–77.0)
Platelets: 207 10*3/uL (ref 150.0–400.0)
RBC: 4.04 Mil/uL (ref 3.87–5.11)
RDW: 13.2 % (ref 11.5–15.5)
WBC: 8 10*3/uL (ref 4.0–10.5)

## 2020-08-22 LAB — COMPREHENSIVE METABOLIC PANEL
ALT: 9 U/L (ref 0–35)
AST: 11 U/L (ref 0–37)
Albumin: 4.5 g/dL (ref 3.5–5.2)
Alkaline Phosphatase: 43 U/L (ref 39–117)
BUN: 10 mg/dL (ref 6–23)
CO2: 28 mEq/L (ref 19–32)
Calcium: 9.7 mg/dL (ref 8.4–10.5)
Chloride: 105 mEq/L (ref 96–112)
Creatinine, Ser: 0.74 mg/dL (ref 0.40–1.20)
GFR: 77.05 mL/min (ref >60.00)
Glucose, Bld: 86 mg/dL (ref 70–99)
Potassium: 4.7 mEq/L (ref 3.5–5.1)
Sodium: 140 mEq/L (ref 135–145)
Total Bilirubin: 0.7 mg/dL (ref 0.2–1.2)
Total Protein: 7.2 g/dL (ref 6.0–8.3)

## 2020-08-22 LAB — HEMOGLOBIN A1C: Hgb A1c MFr Bld: 6.1 % (ref 4.6–6.5)

## 2020-08-22 LAB — PROTIME-INR
INR: 1.1 ratio — ABNORMAL HIGH (ref 0.8–1.0)
Prothrombin Time: 12.4 s (ref 9.6–13.1)

## 2020-08-22 LAB — VITAMIN D 25 HYDROXY (VIT D DEFICIENCY, FRACTURES): VITD: 71.98 ng/mL (ref 30.00–100.00)

## 2020-08-22 LAB — TSH: TSH: 2.52 u[IU]/mL (ref 0.35–4.50)

## 2020-08-22 NOTE — Assessment & Plan Note (Signed)
Advised again of the importance of GI surveillance in regards to progression, decompensation, surveillance of malignancy. She verbalized understanding and agreed with referral.

## 2020-08-22 NOTE — Patient Instructions (Addendum)
Referral to GI Let us know if you dont hear back within a week in regards to an appointment being scheduled.   Please call  and schedule your 3D mammogram, bone density scan as discussed.   [facility]  [address]  [place], [place]  [phone]  Call to make an appointment for Annual Lung cancer screen  With CT Chest: [phone]. Let me know if any issues in doing so.  Trial taking one of your blood pressure medications at night.   It is imperative that you are seen AT least twice per year for labs and monitoring. Monitor blood pressure at home and me 5-6 reading on separate days. Goal is less than 120/80, based on newest guidelines, however we certainly want to be less than 130/80;  if persistently higher, please make sooner follow up appointment so we can recheck you blood pressure and manage/ adjust medications.    Stay safe!

## 2020-08-22 NOTE — Progress Notes (Signed)
Subjective:    Patient ID: Heather Rodgers, female    DOB: July 13, 1946, 74 y.o.   MRN: [id number]  CC: [name] is a 74 y.o. female who presents today for follow up.   HPI: Feels well today  No complaints  HTN- compliant with norvasc 5mg , and telmisartan 80mg  qd. No sob, cp.    HLD- compliant with pravastatin 60mg   Cirrhosis-not established with with GI. No bleeding, ascites.  Due lung cancer screening CT  Hyponatremia- Dr 03/2020  Smoker.   5 years sober.       HISTORY:  Past Medical History:  Diagnosis Date  . Alcohol abuse 2017   rehab 11/2015  . Anemia   . Arthritis   . Colitis 2017   c.difficile  . Hypertension   . Kidney stones    Past Surgical History:  Procedure Laterality Date  . NO PAST SURGERIES     Family History  Problem Relation Age of Onset  . Heart disease Mother   . Hypertension Mother   . Arthritis Father   . Heart disease Father   . Hypertension Father   . Kidney disease Neg Hx     Allergies: Penicillins Current Outpatient Medications on File Prior to Visit  Medication Sig Dispense Refill  . amLODipine (NORVASC) 5 MG tablet Take 1 tablet (5 mg total) by mouth daily. 90 tablet 1  . cetirizine (ZYRTEC) 10 MG tablet Take 10 mg by mouth daily.    . Cholecalciferol (D 5000) 5000 UNITS TABS Take 1 tablet by mouth daily.    . Glucosamine-MSM-Hyaluronic Acd (JOINT HEALTH PO) Take 1 tablet by mouth.    . pravastatin (PRAVACHOL) 20 MG tablet Take 1 tablet (20 mg total) by mouth daily. 90 tablet 3  . pravastatin (PRAVACHOL) 40 MG tablet Take 1 tablet (40 mg total) by mouth daily. 90 tablet 1  . telmisartan (MICARDIS) 80 MG tablet TAKE 1 TABLET BY MOUTH DAILY 30 tablet 2   No current facility-administered medications on file prior to visit.    Social History   Tobacco Use  . Smoking status: Current Some Day Smoker    Packs/day: 1.50    Years: 47.00    Pack years: 70.50    Types: Cigarettes    Start  date: 09/19/1995    Last attempt to quit: 12/01/2015    Years since quitting: 4.7  . Smokeless tobacco: Never Used  Substance Use Topics  . Alcohol use: No    Alcohol/week: 0.0 standard drinks    Comment: sober since late December 2016  . Drug use: No    Review of Systems  Constitutional: Negative for chills and fever.  Respiratory: Negative for cough.   Cardiovascular: Negative for chest pain and palpitations.  Gastrointestinal: Negative for abdominal distention, nausea and vomiting.      Objective:    BP 140/80   Pulse 60   Temp 97.9 F (36.6 C)   Ht 5' 2.5" (1.588 m)   Wt 149 lb 3.2 oz (67.7 kg)   SpO2 99%   BMI 26.85 kg/m  BP Readings from Last 3 Encounters:  08/22/20 140/80  04/13/20 120/88  02/15/20 132/74   Wt Readings from Last 3 Encounters:  08/22/20 149 lb 3.2 oz (67.7 kg)  06/13/20 153 lb (69.4 kg)  04/13/20 153 lb (69.4 kg)    Physical Exam Vitals reviewed.  Constitutional:      Appearance: She is well-developed.  Eyes:     Conjunctiva/sclera: Conjunctivae  normal.  Cardiovascular:     Rate and Rhythm: Normal rate and regular rhythm.     Pulses: Normal pulses.     Heart sounds: Normal heart sounds.  Pulmonary:     Effort: Pulmonary effort is normal.     Breath sounds: Normal breath sounds. No wheezing, rhonchi or rales.  Skin:    General: Skin is warm and dry.  Neurological:     Mental Status: She is alert.  Psychiatric:        Speech: Speech normal.        Behavior: Behavior normal.        Thought Content: Thought content normal.        Assessment & Plan:   Problem List Items Addressed This Visit      Cardiovascular and Mediastinum   Hypertension    Slightly elevated today. Advised to continue norvasc and telmisartan however she will start taking one antihypertensive at night to see if this helps lower blood pressure. She will also either borrow, purchase blood pressure  Monitor to spot check at home and call me with readings.        Relevant Orders   TSH   CBC with Differential/Platelet   Comprehensive metabolic panel     Digestive   Cirrhosis of liver (HCC)    Advised again of the importance of GI surveillance in regards to progression, decompensation, surveillance of malignancy. She verbalized understanding and agreed with referral.       Relevant Orders   CBC with Differential/Platelet   Comprehensive metabolic panel   Hemoglobin A1c   Lipid panel   Protime-INR   Ambulatory referral to Gastroenterology     Other   Hyperlipidemia    Presume controlled. Pending  Lipid panel. Continue pravastatin      Relevant Orders   Lipid panel    Other Visit Diagnoses    Need for immunization against influenza    -  Primary   Relevant Orders   Flu Vaccine QUAD High Dose(Fluad) (Completed)   Screening for osteoporosis       Relevant Orders   MM 3D SCREEN BREAST BILATERAL   DG Bone Density   VITAMIN D 25 Hydroxy (Vit-D Deficiency, Fractures)     Of note: she will call and schedule mammogram, dexa, and CT lung cancer screen  I am having Lucita Ferrara maintain her Cholecalciferol, Glucosamine-MSM-Hyaluronic Acd (JOINT HEALTH PO), pravastatin, amLODipine, pravastatin, cetirizine, and telmisartan.   No orders of the defined types were placed in this encounter.   Return precautions given.   Risks, benefits, and alternatives of the medications and treatment plan prescribed today were discussed, and patient expressed understanding.   Education regarding symptom management and diagnosis given to patient on AVS.  Continue to follow with Allegra Grana, FNP for routine health maintenance.   Heather Rodgers and I agreed with plan.   [name], FNP

## 2020-08-22 NOTE — Assessment & Plan Note (Signed)
Slightly elevated today. Advised to continue norvasc and telmisartan however she will start taking one antihypertensive at night to see if this helps lower blood pressure. She will also either borrow, purchase blood pressure  Monitor to spot check at home and call me with readings.

## 2020-08-22 NOTE — Assessment & Plan Note (Signed)
Presume controlled. Pending  Lipid panel. Continue pravastatin

## 2020-08-23 ENCOUNTER — Other Ambulatory Visit: Payer: Self-pay | Admitting: Family

## 2020-09-01 ENCOUNTER — Encounter: Payer: Self-pay | Admitting: Family

## 2020-09-01 ENCOUNTER — Telehealth: Payer: Self-pay | Admitting: Family

## 2020-09-01 NOTE — Telephone Encounter (Signed)
Rejection Reason - Patient did not respondundefined"  [facility] said on Sep 01, 2020 8:43 AM  I have been unable to reach this patient by phone.  A letter is being sent.

## 2020-09-02 ENCOUNTER — Encounter: Payer: Self-pay | Admitting: *Deleted

## 2020-10-03 ENCOUNTER — Other Ambulatory Visit: Payer: Self-pay | Admitting: Family

## 2020-11-20 ENCOUNTER — Ambulatory Visit: Payer: Self-pay

## 2020-11-22 ENCOUNTER — Ambulatory Visit
Admission: EM | Admit: 2020-11-22 | Discharge: 2020-11-22 | Disposition: A | Discharge status: Home / Self Care | Payer: PPO | Attending: Family Medicine | Admitting: Family Medicine

## 2020-11-22 DIAGNOSIS — R21 Rash and other nonspecific skin eruption: Secondary | ICD-10-CM

## 2020-11-22 MED ORDER — CLOTRIMAZOLE-BETAMETHASONE 1-0.05 % EX CREA: 1.0000 "application " | TOPICAL_CREAM | Freq: Two times a day (BID) | CUTANEOUS | 0 refills | Status: DC

## 2020-11-22 NOTE — ED Provider Notes (Signed)
[name]    CSN: [id number] Arrival date & time: 11/22/20  8469      History   Chief Complaint Chief Complaint  Patient presents with   Rash    HPI Heather Rodgers Heather Rodgers is a 74 y.o. female.   Patient is a 74 year old female presents today with bilateral axilla rash.  This is been present for the past 5 to 6 days.  The rash is very itchy.  The rash is not painful.  Has tried multiple over-the-counter lotions to include cortisone cream, fungal cream without any relief.  Uses Aveeno body wash but has been using this for years.  No new lotions, soaps or detergent.  No new deodorants.  No fevers, chills, body aches.     Past Medical History:  Diagnosis Date   Alcohol abuse 2017   rehab 11/2015   Anemia    Arthritis    Colitis 2017   c.difficile   Hypertension    Kidney stones     Patient Active Problem List   Diagnosis Date Noted   Hyponatremia 04/13/2020   Proteinuria 02/15/2020   Osteoarthritis 02/15/2020   Hyperlipidemia 02/15/2020   Hypertension 02/15/2020   Cirrhosis of liver (HCC) 04/10/2017   Encounter for Papanicolaou smear of cervix 09/28/2015   Exertional dyspnea 06/11/2014   COPD (chronic obstructive pulmonary disease) (HCC) 06/11/2014   Tobacco abuse 02/24/2014   Alcohol abuse 04/16/2013    Past Surgical History:  Procedure Laterality Date   NO PAST SURGERIES      OB History   No obstetric history on file.      Home Medications    Prior to Admission medications   Medication Sig Start Date End Date Taking? Authorizing Provider  amLODipine (NORVASC) 5 MG tablet Take 1 tablet (5 mg total) by mouth daily. 01/12/20  Yes Arnett, Lyn Records, FNP  cetirizine (ZYRTEC) 10 MG tablet Take 10 mg by mouth daily.   Yes [provider]  Cholecalciferol 125 MCG (5000 UT) TABS Take 1 tablet by mouth daily.   Yes [provider]  clotrimazole-betamethasone (LOTRISONE) cream Apply 1 application topically 2  (two) times daily. 11/22/20  Yes Khristian Phillippi A, NP  Glucosamine-MSM-Hyaluronic Acd (JOINT HEALTH PO) Take 1 tablet by mouth.   Yes [provider]  pravastatin (PRAVACHOL) 20 MG tablet Take 1 tablet (20 mg total) by mouth daily. Patient taking differently: Take 40 mg by mouth daily. 03/08/20  Yes Allegra Grana, FNP  pravastatin (PRAVACHOL) 40 MG tablet Take 1 tablet (40 mg total) by mouth daily. Patient taking differently: Take 20 mg by mouth daily. 01/12/20  Yes Allegra Grana, FNP  telmisartan (MICARDIS) 80 MG tablet TAKE 1 TABLET BY MOUTH DAILY 10/03/20  Yes Arnett, Lyn Records, FNP    Family History Family History  Problem Relation Age of Onset   Heart disease Mother    Hypertension Mother    Arthritis Father    Heart disease Father    Hypertension Father    Kidney disease Neg Hx     Social History Social History   Tobacco Use   Smoking status: Current Some Day Smoker    Packs/day: 0.50    Years: 47.00    Pack years: 23.50    Types: Cigarettes    Start date: 09/19/1995    Last attempt to quit: 12/01/2015    Years since quitting: 4.9   Smokeless tobacco: Never Used  Substance Use Topics   Alcohol use: No  Alcohol/week: 0.0 standard drinks    Comment: sober since late December 2016   Drug use: No     Allergies   Penicillins   Review of Systems Review of Systems   Physical Exam Triage Vital Signs ED Triage Vitals  Enc Vitals Group     BP 11/22/20 0925 (!) 154/77     Pulse Rate 11/22/20 0925 72     Resp 11/22/20 0925 18     Temp 11/22/20 0925 99.2 F (37.3 C)     Temp Source 11/22/20 0925 Oral     SpO2 11/22/20 0925 97 %     Weight --      Height --      Head Circumference --      Peak Flow --      Pain Score 11/22/20 0920 0     Pain Loc --      Pain Edu? --      Excl. in GC? --    No data found.  Updated Vital Signs BP (!) 154/77 (BP Location: Left Arm)    Pulse 72    Temp 99.2 F (37.3 C) (Oral)    Resp 18    SpO2 97%    Visual Acuity Right Eye Distance:   Left Eye Distance:   Bilateral Distance:    Right Eye Near:   Left Eye Near:    Bilateral Near:     Physical Exam Vitals and nursing note reviewed.  Constitutional:      General: She is not in acute distress.    Appearance: Normal appearance. She is not ill-appearing, toxic-appearing or diaphoretic.  HENT:     Head: Normocephalic.  Eyes:     Conjunctiva/sclera: Conjunctivae normal.  Pulmonary:     Effort: Pulmonary effort is normal.  Musculoskeletal:        General: Normal range of motion.     Cervical back: Normal range of motion.  Skin:    General: Skin is warm and dry.     Findings: Erythema and rash present.     Comments: Rash bilateral under both axilla.  Generalized erythema  Neurological:     Mental Status: She is alert.  Psychiatric:        Mood and Affect: Mood normal.      UC Treatments / Results  Labs (all labs ordered are listed, but only abnormal results are displayed) Labs Reviewed - No data to display  EKG   Radiology No results found.  Procedures Procedures (including critical care time)  Medications Ordered in UC Medications - No data to display  Initial Impression / Assessment and Plan / UC Course  I have reviewed the triage vital signs and the nursing notes.  Pertinent labs & imaging results that were available during my care of the patient were reviewed by me and considered in my medical decision making (see chart for details).     Rash Contact dermatitis versus tinea cruris or both. Will treat with Lotrisone cream Recommended to apply any products to the area Follow up as needed for continued or worsening symptoms  Final Clinical Impressions(s) / UC Diagnoses   Final diagnoses:  Rash and nonspecific skin eruption     Discharge Instructions     Use the cream twice a day as prescribed.  Do not put any other chemicals or irritants on the skin. Follow up as needed for continued or  worsening symptoms     ED Prescriptions    Medication Sig Dispense Auth. Provider  clotrimazole-betamethasone (LOTRISONE) cream Apply 1 application topically 2 (two) times daily. 30 g Dahlia Byes A, NP     PDMP not reviewed this encounter.   Janace Aris, NP 11/22/20 [phone]

## 2020-11-22 NOTE — Discharge Instructions (Addendum)
Use the cream twice a day as prescribed.  Do not put any other chemicals or irritants on the skin. Follow up as needed for continued or worsening symptoms

## 2020-11-22 NOTE — ED Triage Notes (Signed)
Pt reports rash under R arm starting 5-6 days ago now under L arm and spreading down to torso.  Has tried multiple OTC lotions with no improvement.  Itches but not painful.    Has not changed lotions, detergents, etc.

## 2021-01-12 ENCOUNTER — Other Ambulatory Visit: Payer: Self-pay | Admitting: Family

## 2021-01-12 DIAGNOSIS — E785 Hyperlipidemia, unspecified: Secondary | ICD-10-CM

## 2021-02-13 ENCOUNTER — Other Ambulatory Visit: Payer: Self-pay | Admitting: Family

## 2021-02-13 DIAGNOSIS — I10 Essential (primary) hypertension: Secondary | ICD-10-CM

## 2021-02-20 ENCOUNTER — Ambulatory Visit: Payer: PPO | Admitting: Family

## 2021-03-21 ENCOUNTER — Ambulatory Visit: Payer: PPO | Admitting: Family

## 2021-03-21 DIAGNOSIS — Z0289 Encounter for other administrative examinations: Secondary | ICD-10-CM

## 2021-04-19 ENCOUNTER — Telehealth: Payer: Self-pay | Admitting: Family

## 2021-04-19 ENCOUNTER — Other Ambulatory Visit: Payer: Self-pay

## 2021-04-19 MED ORDER — TELMISARTAN 80 MG PO TABS
80.0000 mg | ORAL_TABLET | Freq: Every day | ORAL | 1 refills | Status: DC
Start: 2021-04-19 — End: 2021-08-25

## 2021-04-19 NOTE — Telephone Encounter (Signed)
I have sent to pharmacy for patient.  

## 2021-04-19 NOTE — Telephone Encounter (Signed)
Rx called in to get a refill of telmisartan (MICARDIS) 80 MG tablet called in for PT.

## 2021-04-19 NOTE — Telephone Encounter (Signed)
Tried to LM but VM is full.

## 2021-06-07 ENCOUNTER — Other Ambulatory Visit: Payer: Self-pay | Admitting: Family

## 2021-06-14 ENCOUNTER — Ambulatory Visit: Payer: PPO

## 2021-08-09 DIAGNOSIS — H52223 Regular astigmatism, bilateral: Secondary | ICD-10-CM | POA: Diagnosis not present

## 2021-08-09 DIAGNOSIS — H35353 Cystoid macular degeneration, bilateral: Secondary | ICD-10-CM | POA: Diagnosis not present

## 2021-08-09 DIAGNOSIS — H524 Presbyopia: Secondary | ICD-10-CM | POA: Diagnosis not present

## 2021-08-09 DIAGNOSIS — Z961 Presence of intraocular lens: Secondary | ICD-10-CM | POA: Diagnosis not present

## 2021-08-09 DIAGNOSIS — H5203 Hypermetropia, bilateral: Secondary | ICD-10-CM | POA: Diagnosis not present

## 2021-08-14 ENCOUNTER — Ambulatory Visit (INDEPENDENT_AMBULATORY_CARE_PROVIDER_SITE_OTHER): Payer: PPO

## 2021-08-14 VITALS — Ht 62.5 in | Wt 149.0 lb

## 2021-08-14 DIAGNOSIS — Z0000 Encounter for general adult medical examination without abnormal findings: Secondary | ICD-10-CM | POA: Diagnosis not present

## 2021-08-14 NOTE — Progress Notes (Signed)
Subjective:   Heather Rodgers is a 75 y.o. female who presents for Medicare Annual (Subsequent) preventive examination.  Review of Systems    No ROS.  Medicare Wellness Virtual Visit.  Visual/audio telehealth visit, UTA vital signs.   See social history for additional risk factors.   Cardiac Risk Factors include: advanced age (>45men, >76 women);hypertension     Objective:    Today's Vitals   08/14/21 0902  Weight: 149 lb (67.6 kg)  Height: 5' 2.5" (1.588 m)   Body mass index is 26.82 kg/m.  Advanced Directives 08/14/2021 06/13/2020 08/06/2017 12/02/2015 12/02/2015 12/01/2015 11/29/2015  Does Patient Have a Medical Advance Directive? No Yes Yes No No No No  Type of Advance Directive - Nurse, children's Power of Attorney - - - -  Does patient want to make changes to medical advance directive? - No - Patient declined No - Patient declined - - - -  Copy of Healthcare Power of Attorney in Chart? - No - copy requested No - copy requested - - - -  Would patient like information on creating a medical advance directive? No - Patient declined - - Yes - Transport planner given Yes - Transport planner given No - patient declined information No - patient declined information    Current Medications (verified) Outpatient Encounter Medications as of 08/14/2021  Medication Sig   amLODipine (NORVASC) 5 MG tablet TAKE 1 TABLET BY MOUTH DAILY   cetirizine (ZYRTEC) 10 MG tablet Take 10 mg by mouth daily.   Cholecalciferol 125 MCG (5000 UT) TABS Take 1 tablet by mouth daily.   clotrimazole-betamethasone (LOTRISONE) cream Apply 1 application topically 2 (two) times daily.   Glucosamine-MSM-Hyaluronic Acd (JOINT HEALTH PO) Take 1 tablet by mouth.   pravastatin (PRAVACHOL) 20 MG tablet TAKE 1 TABLET BY MOUTH DAILY   pravastatin (PRAVACHOL) 40 MG tablet TAKE 1 TABLET BY MOUTH DAILY   telmisartan (MICARDIS) 80 MG tablet Take 1 tablet (80 mg total) by mouth daily.   No  facility-administered encounter medications on file as of 08/14/2021.    Allergies (verified) Penicillins   History: Past Medical History:  Diagnosis Date   Alcohol abuse 2017   rehab 11/2015   Anemia    Arthritis    Colitis 2017   c.difficile   Hypertension    Kidney stones    Past Surgical History:  Procedure Laterality Date   NO PAST SURGERIES     Family History  Problem Relation Age of Onset   Heart disease Mother    Hypertension Mother    Arthritis Father    Heart disease Father    Hypertension Father    Kidney disease Neg Hx    Social History   Socioeconomic History   Marital status: Widowed    Spouse name: Not on file   Number of children: Not on file   Years of education: Not on file   Highest education level: Not on file  Occupational History   Not on file  Tobacco Use   Smoking status: Some Days    Packs/day: 0.50    Years: 47.00    Pack years: 23.50    Types: Cigarettes    Start date: 09/19/1995    Last attempt to quit: 12/01/2015    Years since quitting: 5.7   Smokeless tobacco: Never  Substance and Sexual Activity   Alcohol use: No    Alcohol/week: 0.0 standard drinks    Comment: sober since late December 2016  Drug use: No   Sexual activity: Never  Other Topics Concern   Not on file  Social History Narrative   Widower   Employed on golf course, 3 days week   Some College   Caffeine- 1 cup of coffee daily and no soda or tea   3 children   Social Determinants of Health   Financial Resource Strain: Not on file  Food Insecurity: Not on file  Transportation Needs: Not on file  Physical Activity: Not on file  Stress: Not on file  Social Connections: Not on file    Tobacco Counseling Ready to quit: Not Answered Counseling given: Not Answered   Clinical Intake:  Pre-visit preparation completed: Yes           How often do you need to have someone help you when you read instructions, pamphlets, or other written materials from  your doctor or pharmacy?: 1 - Never    Interpreter Needed?: No      Activities of Daily Living In your present state of health, do you have any difficulty performing the following activities: 08/14/2021  Hearing? N  Vision? N  Difficulty concentrating or making decisions? N  Walking or climbing stairs? N  Dressing or bathing? N  Doing errands, shopping? N  Preparing Food and eating ? N  Using the Toilet? N  In the past six months, have you accidently leaked urine? N  Do you have problems with loss of bowel control? N  Managing your Medications? N  Managing your Finances? N  Housekeeping or managing your Housekeeping? N  Some recent data might be hidden    Patient Care Team: Allegra Grana, FNP as PCP - General (Family Medicine)  Indicate any recent Medical Services you may have received from other than [facility] providers in the past year (date may be approximate).     Assessment:   This is a routine wellness examination for [place].  I connected with Heather Rodgers today by telephone and verified that I am speaking with the correct person using two identifiers. Location patient: home Location provider: work Persons participating in the virtual visit: patient, Engineer, civil (consulting).    I discussed the limitations, risks, security and privacy concerns of performing an evaluation and management service by telephone and the availability of in person appointments. The patient expressed understanding and verbally consented to this telephonic visit.    Interactive audio and video telecommunications were attempted between this provider and patient, however failed, due to patient having technical difficulties OR patient did not have access to video capability.  We continued and completed visit with audio only.  Some vital signs may be absent or patient reported.   Hearing/Vision screen Hearing Screening - Comments:: Patient is able to hear conversational tones without difficulty.  No issues  reported. Vision Screening - Comments:: Wears corrective lenses Cataract extraction, bilateral They have seen their ophthalmologist in the last 12 months.    Dietary issues and exercise activities discussed: Current Exercise Habits: Home exercise routine, Type of exercise: walking, Intensity: Mild Healthy diet Good water intake   Goals Addressed             This Visit's Progress    Healthy lifestyle       Stay active Healthy diet        Depression Screen PHQ 2/9 Scores 08/14/2021 06/13/2020 02/15/2020 08/06/2017 01/02/2017 08/24/2016 11/16/2015  PHQ - 2 Score 0 0 0 0 0 0 1    Fall Risk Fall Risk  08/14/2021 06/13/2020 02/15/2020  08/06/2017 01/02/2017  Falls in the past year? 0 0 0 No No  Number falls in past yr: - 0 - - -  Injury with Fall? - - - - -  Risk for fall due to : - - - - -  Follow up Falls evaluation completed Falls evaluation completed Falls evaluation completed - -    FALL RISK PREVENTION PERTAINING TO THE HOME: Adequate lighting in your home to reduce risk of falls? Yes   ASSISTIVE DEVICES UTILIZED TO PREVENT FALLS: Use of a cane, walker or w/c? No   TIMED UP AND GO: Was the test performed? No .   Cognitive Function: Patient is alert and oriented x3.  Enjoys reading for brain health.  MMSE - Mini Mental State Exam 08/06/2017  Orientation to time 5  Orientation to Place 5  Registration 3  Attention/ Calculation 5  Recall 3  Language- name 2 objects 2  Language- repeat 1  Language- follow 3 step command 3  Language- read & follow direction 1  Write a sentence 1  Copy design 1  Total score 30     6CIT Screen 08/14/2021 06/13/2020  What Year? 0 points 0 points  What month? 0 points 0 points  What time? 0 points 0 points  Count back from 20 0 points -  Months in reverse 0 points -    Immunizations Immunization History  Administered Date(s) Administered   Fluad Quad(high Dose 65+) 08/22/2020   Influenza, High Dose Seasonal PF 08/22/2016,  08/13/2017   Influenza-Unspecified 09/21/2015   Pneumococcal Conjugate-13 01/02/2017   Pneumococcal Polysaccharide-23 04/22/2013   Pneumococcal-Unspecified 04/05/2014   Td 05/10/2014   Shingrix vaccine- Due, Education has been provided regarding the importance of this vaccine. Advised may receive this vaccine at local pharmacy or Health Dept. Aware to provide a copy of the vaccination record if obtained from local pharmacy or Health Dept. Verbalized acceptance and understanding. Deferred.  Health Maintenance Health Maintenance  Topic Date Due   Zoster Vaccines- Shingrix (1 of 2) 11/13/2021 (Originally 03/07/1998)   MAMMOGRAM  08/14/2022 (Originally 09/04/2019)   DEXA SCAN  08/14/2022 (Originally 03/07/2013)   TETANUS/TDAP  05/10/2024   COLONOSCOPY (Pts 45-78yrs Insurance coverage will need to be confirmed)  07/12/2024   Hepatitis C Screening  Completed   HPV VACCINES  Aged Out   COVID-19 Vaccine  Discontinued   Mammogram- declined.  Bone density- declined.   Vision Screening: Recommended annual ophthalmology exams for early detection of glaucoma and other disorders of the eye.  Dental Screening: Recommended annual dental exams for proper oral hygiene.  Community Resource Referral / Chronic Care Management: CRR required this visit?  No   CCM required this visit?  No      Plan:   Keep all routine maintenance appointments.   I have personally reviewed and noted the following in the patient's chart:   Medical and social history Use of alcohol, tobacco or illicit drugs  Current medications and supplements including opioid prescriptions. Not taking opioid. Functional ability and status Nutritional status Physical activity Advanced directives List of other physicians Hospitalizations, surgeries, and ER visits in previous 12 months Vitals Screenings to include cognitive, depression, and falls Referrals and appointments  In addition, I have reviewed and discussed with patient  certain preventive protocols, quality metrics, and best practice recommendations. A written personalized care plan for preventive services as well as general preventive health recommendations were provided to patient.     Ashok Pall, LPN   03/28/8881

## 2021-08-14 NOTE — Patient Instructions (Addendum)
Heather Rodgers , Thank you for taking time to come for your Medicare Wellness Visit. I appreciate your ongoing commitment to your health goals. Please review the following plan we discussed and let me know if I can assist you in the future.   These are the goals we discussed:  Goals       Patient Stated     Increase physical activity (pt-stated)      I want to start going back to the gym      Other     Healthy lifestyle      Stay active Healthy diet         This is a list of the screening recommended for you and due dates:  Health Maintenance  Topic Date Due   Zoster (Shingles) Vaccine (1 of 2) 11/13/2021*   Mammogram  08/14/2022*   DEXA scan (bone density measurement)  08/14/2022*   Tetanus Vaccine  05/10/2024   Colon Cancer Screening  07/12/2024   Hepatitis C Screening: USPSTF Recommendation to screen - Ages 18-79 yo.  Completed   HPV Vaccine  Aged Out   COVID-19 Vaccine  Discontinued  *Topic was postponed. The date shown is not the original due date.   Advanced directives: not yet completed  Conditions/risks identified: none new  Follow up in one year for your annual wellness visit    Preventive Care 65 Years and Older, Female Preventive care refers to lifestyle choices and visits with your health care provider that can promote health and wellness. What does preventive care include? A yearly physical exam. This is also called an annual well check. Dental exams once or twice a year. Routine eye exams. Ask your health care provider how often you should have your eyes checked. Personal lifestyle choices, including: Daily care of your teeth and gums. Regular physical activity. Eating a healthy diet. Avoiding tobacco and drug use. Limiting alcohol use. Practicing safe sex. Taking low-dose aspirin every day. Taking vitamin and mineral supplements as recommended by your health care provider. What happens during an annual well check? The services and screenings done  by your health care provider during your annual well check will depend on your age, overall health, lifestyle risk factors, and family history of disease. Counseling  Your health care provider may ask you questions about your: Alcohol use. Tobacco use. Drug use. Emotional well-being. Home and relationship well-being. Sexual activity. Eating habits. History of falls. Memory and ability to understand (cognition). Work and work Astronomer. Reproductive health. Screening  You may have the following tests or measurements: Height, weight, and BMI. Blood pressure. Lipid and cholesterol levels. These may be checked every 5 years, or more frequently if you are over 86 years old. Skin check. Lung cancer screening. You may have this screening every year starting at age 73 if you have a 30-pack-year history of smoking and currently smoke or have quit within the past 15 years. Fecal occult blood test (FOBT) of the stool. You may have this test every year starting at age 4. Flexible sigmoidoscopy or colonoscopy. You may have a sigmoidoscopy every 5 years or a colonoscopy every 10 years starting at age 78. Hepatitis C blood test. Hepatitis B blood test. Sexually transmitted disease (STD) testing. Diabetes screening. This is done by checking your blood sugar (glucose) after you have not eaten for a while (fasting). You may have this done every 1-3 years. Bone density scan. This is done to screen for osteoporosis. You may have this done starting at age  65. Mammogram. This may be done every 1-2 years. Talk to your health care provider about how often you should have regular mammograms. Talk with your health care provider about your test results, treatment options, and if necessary, the need for more tests. Vaccines  Your health care provider may recommend certain vaccines, such as: Influenza vaccine. This is recommended every year. Tetanus, diphtheria, and acellular pertussis (Tdap, Td) vaccine. You  may need a Td booster every 10 years. Zoster vaccine. You may need this after age 43. Pneumococcal 13-valent conjugate (PCV13) vaccine. One dose is recommended after age 41. Pneumococcal polysaccharide (PPSV23) vaccine. One dose is recommended after age 39. Talk to your health care provider about which screenings and vaccines you need and how often you need them. This information is not intended to replace advice given to you by your health care provider. Make sure you discuss any questions you have with your health care provider. Document Released: 12/09/2015 Document Revised: 08/01/2016 Document Reviewed: 09/13/2015 Elsevier Interactive Patient Education  2017 [place] Prevention in the Home Falls can cause injuries. They can happen to people of all ages. There are many things you can do to make your home safe and to help prevent falls. What can I do on the outside of my home? Regularly fix the edges of walkways and driveways and fix any cracks. Remove anything that might make you trip as you walk through a door, such as a raised step or threshold. Trim any bushes or trees on the path to your home. Use bright outdoor lighting. Clear any walking paths of anything that might make someone trip, such as rocks or tools. Regularly check to see if handrails are loose or broken. Make sure that both sides of any steps have handrails. Any raised decks and porches should have guardrails on the edges. Have any leaves, snow, or ice cleared regularly. Use sand or salt on walking paths during winter. Clean up any spills in your garage right away. This includes oil or grease spills. What can I do in the bathroom? Use night lights. Install grab bars by the toilet and in the tub and shower. Do not use towel bars as grab bars. Use non-skid mats or decals in the tub or shower. If you need to sit down in the shower, use a plastic, non-slip stool. Keep the floor dry. Clean up any water that spills  on the floor as soon as it happens. Remove soap buildup in the tub or shower regularly. Attach bath mats securely with double-sided non-slip rug tape. Do not have throw rugs and other things on the floor that can make you trip. What can I do in the bedroom? Use night lights. Make sure that you have a light by your bed that is easy to reach. Do not use any sheets or blankets that are too big for your bed. They should not hang down onto the floor. Have a firm chair that has side arms. You can use this for support while you get dressed. Do not have throw rugs and other things on the floor that can make you trip. What can I do in the kitchen? Clean up any spills right away. Avoid walking on wet floors. Keep items that you use a lot in easy-to-reach places. If you need to reach something above you, use a strong step stool that has a grab bar. Keep electrical cords out of the way. Do not use floor polish or wax that makes floors slippery.  If you must use wax, use non-skid floor wax. Do not have throw rugs and other things on the floor that can make you trip. What can I do with my stairs? Do not leave any items on the stairs. Make sure that there are handrails on both sides of the stairs and use them. Fix handrails that are broken or loose. Make sure that handrails are as long as the stairways. Check any carpeting to make sure that it is firmly attached to the stairs. Fix any carpet that is loose or worn. Avoid having throw rugs at the top or bottom of the stairs. If you do have throw rugs, attach them to the floor with carpet tape. Make sure that you have a light switch at the top of the stairs and the bottom of the stairs. If you do not have them, ask someone to add them for you. What else can I do to help prevent falls? Wear shoes that: Do not have high heels. Have rubber bottoms. Are comfortable and fit you well. Are closed at the toe. Do not wear sandals. If you use a stepladder: Make  sure that it is fully opened. Do not climb a closed stepladder. Make sure that both sides of the stepladder are locked into place. Ask someone to hold it for you, if possible. Clearly mark and make sure that you can see: Any grab bars or handrails. First and last steps. Where the edge of each step is. Use tools that help you move around (mobility aids) if they are needed. These include: Canes. Walkers. Scooters. Crutches. Turn on the lights when you go into a dark area. Replace any light bulbs as soon as they burn out. Set up your furniture so you have a clear path. Avoid moving your furniture around. If any of your floors are uneven, fix them. If there are any pets around you, be aware of where they are. Review your medicines with your doctor. Some medicines can make you feel dizzy. This can increase your chance of falling. Ask your doctor what other things that you can do to help prevent falls. This information is not intended to replace advice given to you by your health care provider. Make sure you discuss any questions you have with your health care provider. Document Released: 09/08/2009 Document Revised: 04/19/2016 Document Reviewed: 12/17/2014 Elsevier Interactive Patient Education  2017 [employer].

## 2021-08-25 ENCOUNTER — Other Ambulatory Visit: Payer: Self-pay | Admitting: Family

## 2021-08-25 DIAGNOSIS — E785 Hyperlipidemia, unspecified: Secondary | ICD-10-CM

## 2021-09-08 ENCOUNTER — Other Ambulatory Visit: Payer: Self-pay | Admitting: Family

## 2021-09-08 DIAGNOSIS — I10 Essential (primary) hypertension: Secondary | ICD-10-CM

## 2022-02-26 ENCOUNTER — Other Ambulatory Visit: Payer: Self-pay | Admitting: Family

## 2022-04-18 ENCOUNTER — Other Ambulatory Visit: Payer: Self-pay | Admitting: Family

## 2022-04-18 DIAGNOSIS — I10 Essential (primary) hypertension: Secondary | ICD-10-CM

## 2022-05-02 ENCOUNTER — Other Ambulatory Visit: Payer: Self-pay

## 2022-05-02 ENCOUNTER — Telehealth: Payer: Self-pay | Admitting: Family

## 2022-05-02 DIAGNOSIS — M159 Polyosteoarthritis, unspecified: Secondary | ICD-10-CM

## 2022-05-02 NOTE — Progress Notes (Signed)
BONE

## 2022-05-02 NOTE — Telephone Encounter (Signed)
Referral placed for Bone Density at [facility]

## 2022-05-02 NOTE — Telephone Encounter (Signed)
Patient is having a mammogram on 05/22/2022 at [facility] and would like a referral for the same day to have a bone density test done .

## 2022-05-22 DIAGNOSIS — Z1231 Encounter for screening mammogram for malignant neoplasm of breast: Secondary | ICD-10-CM | POA: Diagnosis not present

## 2022-05-26 ENCOUNTER — Other Ambulatory Visit: Payer: Self-pay | Admitting: Family

## 2022-05-26 DIAGNOSIS — E785 Hyperlipidemia, unspecified: Secondary | ICD-10-CM

## 2022-06-01 ENCOUNTER — Telehealth: Payer: Self-pay | Admitting: Family

## 2022-06-01 NOTE — Telephone Encounter (Signed)
noted 

## 2022-06-01 NOTE — Telephone Encounter (Signed)
Call patient I received mammogram report from [facility] of left breast asymmetry and the need for additional imaging.  Has she been contacted for additional imaging?    Do I need to order left breast ultrasound and diagnostic mammogram?

## 2022-06-01 NOTE — Telephone Encounter (Signed)
Spoke to patient and she has an appt next week for additional imaging. We do not need to order additional screening.

## 2022-06-11 DIAGNOSIS — N6002 Solitary cyst of left breast: Secondary | ICD-10-CM | POA: Diagnosis not present

## 2022-06-11 DIAGNOSIS — R928 Other abnormal and inconclusive findings on diagnostic imaging of breast: Secondary | ICD-10-CM | POA: Diagnosis not present

## 2022-06-15 ENCOUNTER — Telehealth: Payer: Self-pay | Admitting: Family

## 2022-06-15 NOTE — Telephone Encounter (Signed)
Call patient I received mammogram report from [facility] which was benign category   however concern for left breast sebaceous or epidermal inclusion cyst.  If patient has any concerns about left breast cyst or otherwise, please make an appointment so I can evaluate her in person.    Otherwise annual screening mammography to be done again in 1 year.

## 2022-06-15 NOTE — Telephone Encounter (Signed)
Spoke to patient about her results from mammogram. Patient verbalized understanding and stated that at this time she does not want to schedule an appt.

## 2022-06-26 ENCOUNTER — Encounter: Payer: Self-pay | Admitting: Emergency Medicine

## 2022-06-26 ENCOUNTER — Other Ambulatory Visit: Payer: Self-pay

## 2022-06-26 ENCOUNTER — Ambulatory Visit
Admission: EM | Admit: 2022-06-26 | Discharge: 2022-06-26 | Disposition: A | Discharge status: Home / Self Care | Payer: PPO | Attending: Emergency Medicine | Admitting: Emergency Medicine

## 2022-06-26 DIAGNOSIS — L02412 Cutaneous abscess of left axilla: Secondary | ICD-10-CM

## 2022-06-26 MED ORDER — CEPHALEXIN 500 MG PO CAPS: 500.0000 mg | ORAL_CAPSULE | Freq: Two times a day (BID) | ORAL | 0 refills | Status: AC

## 2022-06-26 NOTE — Discharge Instructions (Signed)
Your abscess has already begun to drain on its own it will not need assisted drainage today and will continue to do so especially with the antibiotics are on board  Take Keflex every morning and every evening for the next 5 days  Hold warm-hot compresses to affected area at least 4 times a day, this helps to facilitate draining, the more the better  Please return for evaluation for increased swelling, increased tenderness or pain, non healing site, non draining site, you begin to have fever or chills   We reviewed the etiology of recurrent abscesses of skin.  Skin abscesses are collections of pus within the dermis and deeper skin tissues. Skin abscesses manifest as painful, tender, fluctuant, and erythematous nodules, frequently surmounted by a pustule and surrounded by a rim of erythematous swelling.  Spontaneous drainage of purulent material may occur.  Fever can occur on occasion.    -Skin abscesses can develop in healthy individuals with no predisposing conditions other than skin or nasal carriage of Staphylococcus aureus.  Individuals in close contact with others who have active infection with skin abscesses are at increased risk which is likely to explain why twin brother has similar episodes.   In addition, any process leading to a breach in the skin barrier can also predispose to the development of a skin abscesses, such as atopic dermatitis.

## 2022-06-26 NOTE — ED Triage Notes (Signed)
Patient is in department for a cyst to left axilla.  Patient reports this area was fine until after having a mammogram.  Reports this cyst was seen and had to go for another testing.  Afterwards, this area is larger, painful.  Patient has been able to express a small amount of pus.

## 2022-06-26 NOTE — ED Provider Notes (Signed)
[name]    CSN: [id number] Arrival date & time: 06/26/22  1149      History   Chief Complaint Chief Complaint  Patient presents with   cyst    HPI Heather Rodgers is a 76 y.o. female.   Patient presents with abscess to the left axilla that has begun to cause pain over the last 3 to 4 days  Endorses that abscess has been present for greater than 1 month but has caused no issue, symptoms flared after mammogram where they were pressing on the breast and lymphatic tissue.  Site began draining 1 day ago.  Denies fever or chills.  Has been holding warm compresses over the affected area as well as applying topical antibiotic cream.  Past Medical History:  Diagnosis Date   Alcohol abuse 2017   rehab 11/2015   Anemia    Arthritis    Colitis 2017   c.difficile   Hypertension    Kidney stones     Patient Active Problem List   Diagnosis Date Noted   Hyponatremia 04/13/2020   Proteinuria 02/15/2020   Osteoarthritis 02/15/2020   Hyperlipidemia 02/15/2020   Hypertension 02/15/2020   Cirrhosis of liver (HCC) 04/10/2017   Encounter for Papanicolaou smear of cervix 09/28/2015   Exertional dyspnea 06/11/2014   COPD (chronic obstructive pulmonary disease) (HCC) 06/11/2014   Tobacco abuse 02/24/2014   Alcohol abuse 04/16/2013    Past Surgical History:  Procedure Laterality Date   NO PAST SURGERIES      OB History   No obstetric history on file.      Home Medications    Prior to Admission medications   Medication Sig Start Date End Date Taking? Authorizing Provider  cephALEXin (KEFLEX) 500 MG capsule Take 1 capsule (500 mg total) by mouth 2 (two) times daily for 5 days. 06/26/22 07/01/22 Yes Talene Glastetter R, NP  amLODipine (NORVASC) 5 MG tablet TAKE 1 TABLET BY MOUTH DAILY 04/18/22   Allegra Grana, FNP  cetirizine (ZYRTEC) 10 MG tablet Take 10 mg by mouth daily.    [provider]  Cholecalciferol 125 MCG (5000 UT) TABS Take 1 tablet by  mouth daily.    [provider]  clotrimazole-betamethasone (LOTRISONE) cream Apply 1 application topically 2 (two) times daily. 11/22/20   Janace Aris, NP  Glucosamine-MSM-Hyaluronic Acd (JOINT HEALTH PO) Take 1 tablet by mouth.    [provider]  pravastatin (PRAVACHOL) 20 MG tablet TAKE 1 TABLET BY MOUTH DAILY 06/07/21   Allegra Grana, FNP  pravastatin (PRAVACHOL) 40 MG tablet TAKE 1 TABLET BY MOUTH DAILY 05/28/22   Allegra Grana, FNP  telmisartan (MICARDIS) 80 MG tablet TAKE 1 TABLET BY MOUTH DAILY 02/26/22   Allegra Grana, FNP    Family History Family History  Problem Relation Age of Onset   Heart disease Mother    Hypertension Mother    Arthritis Father    Heart disease Father    Hypertension Father    Kidney disease Neg Hx     Social History Social History   Tobacco Use   Smoking status: Some Days    Packs/day: 0.50    Years: 47.00    Total pack years: 23.50    Types: Cigarettes    Start date: 09/19/1995    Last attempt to quit: 12/01/2015    Years since quitting: 6.5   Smokeless tobacco: Never  Vaping Use   Vaping Use: Never used  Substance Use Topics  Alcohol use: No    Alcohol/week: 0.0 standard drinks of alcohol    Comment: sober since late December 2016   Drug use: No     Allergies   Penicillins   Review of Systems Review of Systems  Constitutional: Negative.   Respiratory: Negative.    Gastrointestinal: Negative.   Skin:  Positive for wound. Negative for color change, pallor and rash.  Neurological: Negative.      Physical Exam Triage Vital Signs ED Triage Vitals  Enc Vitals Group     BP 06/26/22 1212 (!) 161/94     Pulse Rate 06/26/22 1212 63     Resp 06/26/22 1212 18     Temp 06/26/22 1212 98.3 F (36.8 C)     Temp Source 06/26/22 1212 Oral     SpO2 06/26/22 1212 95 %     Weight --      Height --      Head Circumference --      Peak Flow --      Pain Score 06/26/22 1214 6     Pain Loc --      Pain  Edu? --      Excl. in GC? --    No data found.  Updated Vital Signs BP (!) 161/94 (BP Location: Left Arm)   Pulse 63   Temp 98.3 F (36.8 C) (Oral)   Resp 18   SpO2 95%   Visual Acuity Right Eye Distance:   Left Eye Distance:   Bilateral Distance:    Right Eye Near:   Left Eye Near:    Bilateral Near:     Physical Exam Constitutional:      Appearance: Normal appearance.  HENT:     Head: Normocephalic.  Eyes:     Extraocular Movements: Extraocular movements intact.  Skin:    Comments: 2 x 3 cm erythematous abscess present to the the left axilla at approximately 5 to 6 o'clock, puslike drainage noted and can be expelled with minimal pressure  Neurological:     Mental Status: She is alert and oriented to person, place, and time. Mental status is at baseline.  Psychiatric:        Mood and Affect: Mood normal.        Behavior: Behavior normal.      UC Treatments / Results  Labs (all labs ordered are listed, but only abnormal results are displayed) Labs Reviewed - No data to display  EKG   Radiology No results found.  Procedures Procedures (including critical care time)  Medications Ordered in UC Medications - No data to display  Initial Impression / Assessment and Plan / UC Course  I have reviewed the triage vital signs and the nursing notes.  Pertinent labs & imaging results that were available during my care of the patient were reviewed by me and considered in my medical decision making (see chart for details)  Abscess of the left axilla  As site is already draining, will not attempt incision and drainage, discussed with patient, Keflex 5-day course prescribed, recommended continued warm compresses to the affected area as well as Tylenol for management of discomfort, given strict precautions to follow-up with urgent care for nonhealing state Final diagnoses:  Abscess of left axilla     Discharge Instructions      Your abscess has already begun to  drain on its own it will not need assisted drainage today and will continue to do so especially with the antibiotics are on board  Take Keflex every morning and every evening for the next 5 days  Hold warm-hot compresses to affected area at least 4 times a day, this helps to facilitate draining, the more the better  Please return for evaluation for increased swelling, increased tenderness or pain, non healing site, non draining site, you begin to have fever or chills   We reviewed the etiology of recurrent abscesses of skin.  Skin abscesses are collections of pus within the dermis and deeper skin tissues. Skin abscesses manifest as painful, tender, fluctuant, and erythematous nodules, frequently surmounted by a pustule and surrounded by a rim of erythematous swelling.  Spontaneous drainage of purulent material may occur.  Fever can occur on occasion.    -Skin abscesses can develop in healthy individuals with no predisposing conditions other than skin or nasal carriage of Staphylococcus aureus.  Individuals in close contact with others who have active infection with skin abscesses are at increased risk which is likely to explain why twin brother has similar episodes.   In addition, any process leading to a breach in the skin barrier can also predispose to the development of a skin abscesses, such as atopic dermatitis.      ED Prescriptions     Medication Sig Dispense Auth. Provider   cephALEXin (KEFLEX) 500 MG capsule Take 1 capsule (500 mg total) by mouth 2 (two) times daily for 5 days. 10 capsule Valinda Hoar, NP      PDMP not reviewed this encounter.   Valinda Hoar, NP 06/26/22 1309

## 2022-07-25 DIAGNOSIS — Z780 Asymptomatic menopausal state: Secondary | ICD-10-CM | POA: Diagnosis not present

## 2022-07-25 DIAGNOSIS — M1990 Unspecified osteoarthritis, unspecified site: Secondary | ICD-10-CM | POA: Diagnosis not present

## 2022-07-27 ENCOUNTER — Telehealth: Payer: Self-pay | Admitting: Family

## 2022-07-27 NOTE — Telephone Encounter (Signed)
Call patient  She had a normal bone mineral density.  Please repeat in 2 years

## 2022-07-27 NOTE — Telephone Encounter (Signed)
LVM to inform her that bone density was normal and to repeat in 2 years

## 2022-07-31 NOTE — Telephone Encounter (Signed)
Spoke to patient and informed her of her results. Patient verbalized understanding

## 2022-08-28 ENCOUNTER — Other Ambulatory Visit: Payer: Self-pay | Admitting: Family

## 2022-08-28 ENCOUNTER — Ambulatory Visit (INDEPENDENT_AMBULATORY_CARE_PROVIDER_SITE_OTHER): Payer: PPO

## 2022-08-28 VITALS — Ht 62.5 in | Wt 149.0 lb

## 2022-08-28 DIAGNOSIS — Z0000 Encounter for general adult medical examination without abnormal findings: Secondary | ICD-10-CM

## 2022-08-28 NOTE — Progress Notes (Signed)
Subjective:   Heather Rodgers is a 76 y.o. female who presents for Medicare Annual (Subsequent) preventive examination.  Review of Systems    No ROS.  Medicare Wellness Virtual Visit.  Visual/audio telehealth visit, UTA vital signs.   See social history for additional risk factors.   Cardiac Risk Factors include: advanced age (>50men, >49 women);hypertension     Objective:    Today's Vitals   08/28/22 1536  Weight: 149 lb (67.6 kg)  Height: 5' 2.5" (1.588 m)   Body mass index is 26.82 kg/m.     08/28/2022    3:17 PM 08/14/2021    9:20 AM 06/13/2020    1:44 PM 08/06/2017    8:15 AM 12/02/2015    2:36 AM 12/02/2015    2:30 AM 12/01/2015    7:03 PM  Advanced Directives  Does Patient Have a Medical Advance Directive? No No Yes Yes No No No  Type of Advance Directive   Healthcare Power of [employer] Power of Attorney     Does patient want to make changes to medical advance directive?   No - Patient declined No - Patient declined     Copy of Healthcare Power of Attorney in Chart?   No - copy requested No - copy requested     Would patient like information on creating a medical advance directive? No - Patient declined No - Patient declined   Yes - Transport planner given Yes - Transport planner given No - patient declined information    Current Medications (verified) Outpatient Encounter Medications as of 08/28/2022  Medication Sig   amLODipine (NORVASC) 5 MG tablet TAKE 1 TABLET BY MOUTH DAILY   cetirizine (ZYRTEC) 10 MG tablet Take 10 mg by mouth daily.   Cholecalciferol 125 MCG (5000 UT) TABS Take 1 tablet by mouth daily.   clotrimazole-betamethasone (LOTRISONE) cream Apply 1 application topically 2 (two) times daily.   Glucosamine-MSM-Hyaluronic Acd (JOINT HEALTH PO) Take 1 tablet by mouth.   pravastatin (PRAVACHOL) 20 MG tablet TAKE 1 TABLET BY MOUTH DAILY   pravastatin (PRAVACHOL) 40 MG tablet TAKE 1 TABLET BY MOUTH DAILY   telmisartan (MICARDIS) 80  MG tablet TAKE 1 TABLET BY MOUTH DAILY   No facility-administered encounter medications on file as of 08/28/2022.    Allergies (verified) Penicillins   History: Past Medical History:  Diagnosis Date   Alcohol abuse 2017   rehab 11/2015   Anemia    Arthritis    Colitis 2017   c.difficile   Hypertension    Kidney stones    Past Surgical History:  Procedure Laterality Date   NO PAST SURGERIES     Family History  Problem Relation Age of Onset   Heart disease Mother    Hypertension Mother    Arthritis Father    Heart disease Father    Hypertension Father    Kidney disease Neg Hx    Social History   Socioeconomic History   Marital status: Widowed    Spouse name: Not on file   Number of children: Not on file   Years of education: Not on file   Highest education level: Not on file  Occupational History   Not on file  Tobacco Use   Smoking status: Some Days    Packs/day: 0.50    Years: 47.00    Total pack years: 23.50    Types: Cigarettes    Start date: 09/19/1995    Last attempt to quit: 12/01/2015  Years since quitting: 6.7   Smokeless tobacco: Never  Vaping Use   Vaping Use: Never used  Substance and Sexual Activity   Alcohol use: No    Alcohol/week: 0.0 standard drinks of alcohol    Comment: sober since late December 2016   Drug use: No   Sexual activity: Never  Other Topics Concern   Not on file  Social History Narrative   Widower   Employed on golf course, 3 days week   Some College   Caffeine- 1 cup of coffee daily and no soda or tea   3 children   Social Determinants of Health   Financial Resource Strain: Low Risk  (08/28/2022)   Overall Financial Resource Strain (CARDIA)    Difficulty of Paying Living Expenses: Not hard at all  Food Insecurity: No Food Insecurity (08/28/2022)   Hunger Vital Sign    Worried About Running Out of Food in the Last Year: Never true    Ran Out of Food in the Last Year: Never true  Transportation Needs: No  Transportation Needs (08/28/2022)   PRAPARE - Administrator, Civil Service (Medical): No    Lack of Transportation (Non-Medical): No  Physical Activity: Not on file  Stress: No Stress Concern Present (08/28/2022)   [employer] of Occupational Health - Occupational Stress Questionnaire    Feeling of Stress : Not at all  Social Connections: Unknown (08/28/2022)   Social Connection and Isolation Panel [NHANES]    Frequency of Communication with Friends and Family: More than three times a week    Frequency of Social Gatherings with Friends and Family: More than three times a week    Attends Religious Services: Not on Marketing executive or Organizations: Not on file    Attends Banker Meetings: Not on file    Marital Status: Not on file    Tobacco Counseling Ready to quit: Not Answered Counseling given: Not Answered   Clinical Intake:  Pre-visit preparation completed: Yes        Diabetes: No  How often do you need to have someone help you when you read instructions, pamphlets, or other written materials from your doctor or pharmacy?: 1 - Never  Interpreter Needed?: No      Activities of Daily Living    08/28/2022    3:18 PM  In your present state of health, do you have any difficulty performing the following activities:  Hearing? 0  Vision? 0  Difficulty concentrating or making decisions? 0  Walking or climbing stairs? 0  Dressing or bathing? 0  Doing errands, shopping? 0  Preparing Food and eating ? N  Using the Toilet? N  In the past six months, have you accidently leaked urine? N  Do you have problems with loss of bowel control? N  Managing your Medications? N  Managing your Finances? N  Housekeeping or managing your Housekeeping? N    Patient Care Team: Allegra Grana, FNP as PCP - General (Family Medicine)  Indicate any recent Medical Services you may have received from other than [facility] providers in the past year  (date may be approximate).     Assessment:   This is a routine wellness examination for Heather Rodgers.  I connected with  Heather Rodgers on 08/28/22 by a audio enabled telemedicine application and verified that I am speaking with the correct person using two identifiers.  Patient Location: Home  Provider Location: Office/Clinic  I discussed  the limitations of evaluation and management by telemedicine. The patient expressed understanding and agreed to proceed.   Hearing/Vision screen Hearing Screening - Comments:: Patient is able to hear conversational tones without difficulty. No issues reported. Vision Screening - Comments:: Wears corrective lenses  Cataract extraction, bilateral  They have seen their ophthalmologist in the last 12 months.   Dietary issues and exercise activities discussed:     Goals Addressed               This Visit's Progress     Patient Stated     Increase physical activity (pt-stated)        Stay active      Other     Healthy lifestyle   On track     Stay active Healthy diet        Depression Screen    08/28/2022    3:14 PM 08/14/2021    9:21 AM 06/13/2020    1:45 PM 02/15/2020   11:23 AM 08/06/2017    8:19 AM 01/02/2017    8:05 AM 08/24/2016    1:37 PM  PHQ 2/9 Scores  PHQ - 2 Score 0 0 0 0 0 0 0    Fall Risk    08/28/2022    3:18 PM 08/14/2021    9:21 AM 06/13/2020    1:45 PM 02/15/2020   11:23 AM 08/06/2017    8:19 AM  Fall Risk   Falls in the past year? 0 0 0 0 No  Number falls in past yr: 0  0    Injury with Fall? 0      Follow up Falls prevention discussed;Falls evaluation completed Falls evaluation completed Falls evaluation completed Falls evaluation completed     FALL RISK PREVENTION PERTAINING TO THE HOME: Home free of loose throw rugs in walkways, pet beds, electrical cords, etc? Yes  Adequate lighting in your home to reduce risk of falls? Yes   ASSISTIVE DEVICES UTILIZED TO PREVENT FALLS: Life alert? No  Use of  a cane, walker or w/c? No   TIMED UP AND GO: Was the test performed? No .   Cognitive Function:    08/06/2017    8:33 AM  MMSE - Mini Mental State Exam  Orientation to time 5  Orientation to Place 5  Registration 3  Attention/ Calculation 5  Recall 3  Language- name 2 objects 2  Language- repeat 1  Language- follow 3 step command 3  Language- read & follow direction 1  Write a sentence 1  Copy design 1  Total score 30        08/28/2022    3:19 PM 08/14/2021    9:22 AM 06/13/2020    1:46 PM  6CIT Screen  What Year? 0 points 0 points 0 points  What month? 0 points 0 points 0 points  What time? 0 points 0 points 0 points  Count back from 20 0 points 0 points   Months in reverse 0 points 0 points   Repeat phrase 0 points    Total Score 0 points      Immunizations Immunization History  Administered Date(s) Administered   Fluad Quad(high Dose 65+) 08/22/2020   Influenza, High Dose Seasonal PF 08/22/2016, 08/13/2017   Influenza-Unspecified 09/21/2015   Pneumococcal Conjugate-13 01/02/2017   Pneumococcal Polysaccharide-23 04/22/2013   Pneumococcal-Unspecified 04/05/2014   Td 05/10/2014    Shingrix Completed?: No.    Education has been provided regarding the importance of this vaccine. Patient has been advised  to call insurance company to determine out of pocket expense if they have not yet received this vaccine. Advised may also receive vaccine at local pharmacy or Health Dept. Verbalized acceptance and understanding.  Screening Tests Health Maintenance  Topic Date Due   Zoster Vaccines- Shingrix (1 of 2) 11/28/2022 (Originally [birth year]/07/1998)   TETANUS/TDAP  05/10/2024   MAMMOGRAM  05/22/2024   COLONOSCOPY (Pts 45-80yrs Insurance coverage will need to be confirmed)  07/12/2024   Pneumonia Vaccine 27+ Years old  Completed   DEXA SCAN  Completed   Hepatitis C Screening  Completed   HPV VACCINES  Aged Out   COVID-19 Vaccine  Discontinued    Health Maintenance  There  are no preventive care reminders to display for this patient.  Hepatitis C Screening: Completed 2015  Vision Screening: Recommended annual ophthalmology exams for early detection of glaucoma and other disorders of the eye.  Dental Screening: Recommended annual dental exams for proper oral hygiene  Community Resource Referral / Chronic Care Management: CRR required this visit?  No   CCM required this visit?  No      Plan:     I have personally reviewed and noted the following in the patient's chart:   Medical and social history Use of alcohol, tobacco or illicit drugs  Current medications and supplements including opioid prescriptions. Patient is not currently taking opioid prescriptions. Functional ability and status Nutritional status Physical activity Advanced directives List of other physicians Hospitalizations, surgeries, and ER visits in previous 12 months Vitals Screenings to include cognitive, depression, and falls Referrals and appointments  In addition, I have reviewed and discussed with patient certain preventive protocols, quality metrics, and best practice recommendations. A written personalized care plan for preventive services as well as general preventive health recommendations were provided to patient.     Varney Biles, LPN   34/07/1790

## 2022-08-28 NOTE — Patient Instructions (Addendum)
Heather Rodgers , Thank you for taking time to come for your Medicare Wellness Visit. I appreciate your ongoing commitment to your health goals. Please review the following plan we discussed and let me know if I can assist you in the future.   These are the goals we discussed:  Goals       Patient Stated     Increase physical activity (pt-stated)      Stay active      Other     Healthy lifestyle      Stay active Healthy diet         This is a list of the screening recommended for you and due dates:  Health Maintenance  Topic Date Due   Zoster (Shingles) Vaccine (1 of 2) 11/28/2022*   Tetanus Vaccine  05/10/2024   Mammogram  05/22/2024   Colon Cancer Screening  07/12/2024   Pneumonia Vaccine  Completed   DEXA scan (bone density measurement)  Completed   Hepatitis C Screening: USPSTF Recommendation to screen - Ages 17-79 yo.  Completed   HPV Vaccine  Aged Out   COVID-19 Vaccine  Discontinued  *Topic was postponed. The date shown is not the original due date.    Advanced directives: not completed at this time.   Conditions/risks identified: none new at this time.   Next appointment: Follow up in one year for your annual wellness visit    Preventive Care 65 Years and Older, Female Preventive care refers to lifestyle choices and visits with your health care provider that can promote health and wellness. What does preventive care include? A yearly physical exam. This is also called an annual well check. Dental exams once or twice a year. Routine eye exams. Ask your health care provider how often you should have your eyes checked. Personal lifestyle choices, including: Daily care of your teeth and gums. Regular physical activity. Eating a healthy diet. Avoiding tobacco and drug use. Limiting alcohol use. Practicing safe sex. Taking low-dose aspirin every day. Taking vitamin and mineral supplements as recommended by your health care provider. What happens during an annual  well check? The services and screenings done by your health care provider during your annual well check will depend on your age, overall health, lifestyle risk factors, and family history of disease. Counseling  Your health care provider may ask you questions about your: Alcohol use. Tobacco use. Drug use. Emotional well-being. Home and relationship well-being. Sexual activity. Eating habits. History of falls. Memory and ability to understand (cognition). Work and work Astronomer. Reproductive health. Screening  You may have the following tests or measurements: Height, weight, and BMI. Blood pressure. Lipid and cholesterol levels. These may be checked every 5 years, or more frequently if you are over 24 years old. Skin check. Lung cancer screening. You may have this screening every year starting at age 32 if you have a 30-pack-year history of smoking and currently smoke or have quit within the past 15 years. Fecal occult blood test (FOBT) of the stool. You may have this test every year starting at age 87. Flexible sigmoidoscopy or colonoscopy. You may have a sigmoidoscopy every 5 years or a colonoscopy every 10 years starting at age 56. Hepatitis C blood test. Hepatitis B blood test. Sexually transmitted disease (STD) testing. Diabetes screening. This is done by checking your blood sugar (glucose) after you have not eaten for a while (fasting). You may have this done every 1-3 years. Bone density scan. This is done to screen for  osteoporosis. You may have this done starting at age 60. Mammogram. This may be done every 1-2 years. Talk to your health care provider about how often you should have regular mammograms. Talk with your health care provider about your test results, treatment options, and if necessary, the need for more tests. Vaccines  Your health care provider may recommend certain vaccines, such as: Influenza vaccine. This is recommended every year. Tetanus, diphtheria,  and acellular pertussis (Tdap, Td) vaccine. You may need a Td booster every 10 years. Zoster vaccine. You may need this after age 31. Pneumococcal 13-valent conjugate (PCV13) vaccine. One dose is recommended after age 36. Pneumococcal polysaccharide (PPSV23) vaccine. One dose is recommended after age 70. Talk to your health care provider about which screenings and vaccines you need and how often you need them. This information is not intended to replace advice given to you by your health care provider. Make sure you discuss any questions you have with your health care provider. Document Released: 12/09/2015 Document Revised: 08/01/2016 Document Reviewed: 09/13/2015 Elsevier Interactive Patient Education  2017 [place] Prevention in the Home Falls can cause injuries. They can happen to people of all ages. There are many things you can do to make your home safe and to help prevent falls. What can I do on the outside of my home? Regularly fix the edges of walkways and driveways and fix any cracks. Remove anything that might make you trip as you walk through a door, such as a raised step or threshold. Trim any bushes or trees on the path to your home. Use bright outdoor lighting. Clear any walking paths of anything that might make someone trip, such as rocks or tools. Regularly check to see if handrails are loose or broken. Make sure that both sides of any steps have handrails. Any raised decks and porches should have guardrails on the edges. Have any leaves, snow, or ice cleared regularly. Use sand or salt on walking paths during winter. Clean up any spills in your garage right away. This includes oil or grease spills. What can I do in the bathroom? Use night lights. Install grab bars by the toilet and in the tub and shower. Do not use towel bars as grab bars. Use non-skid mats or decals in the tub or shower. If you need to sit down in the shower, use a plastic, non-slip  stool. Keep the floor dry. Clean up any water that spills on the floor as soon as it happens. Remove soap buildup in the tub or shower regularly. Attach bath mats securely with double-sided non-slip rug tape. Do not have throw rugs and other things on the floor that can make you trip. What can I do in the bedroom? Use night lights. Make sure that you have a light by your bed that is easy to reach. Do not use any sheets or blankets that are too big for your bed. They should not hang down onto the floor. Have a firm chair that has side arms. You can use this for support while you get dressed. Do not have throw rugs and other things on the floor that can make you trip. What can I do in the kitchen? Clean up any spills right away. Avoid walking on wet floors. Keep items that you use a lot in easy-to-reach places. If you need to reach something above you, use a strong step stool that has a grab bar. Keep electrical cords out of the way. Do not  use floor polish or wax that makes floors slippery. If you must use wax, use non-skid floor wax. Do not have throw rugs and other things on the floor that can make you trip. What can I do with my stairs? Do not leave any items on the stairs. Make sure that there are handrails on both sides of the stairs and use them. Fix handrails that are broken or loose. Make sure that handrails are as long as the stairways. Check any carpeting to make sure that it is firmly attached to the stairs. Fix any carpet that is loose or worn. Avoid having throw rugs at the top or bottom of the stairs. If you do have throw rugs, attach them to the floor with carpet tape. Make sure that you have a light switch at the top of the stairs and the bottom of the stairs. If you do not have them, ask someone to add them for you. What else can I do to help prevent falls? Wear shoes that: Do not have high heels. Have rubber bottoms. Are comfortable and fit you well. Are closed at the  toe. Do not wear sandals. If you use a stepladder: Make sure that it is fully opened. Do not climb a closed stepladder. Make sure that both sides of the stepladder are locked into place. Ask someone to hold it for you, if possible. Clearly mark and make sure that you can see: Any grab bars or handrails. First and last steps. Where the edge of each step is. Use tools that help you move around (mobility aids) if they are needed. These include: Canes. Walkers. Scooters. Crutches. Turn on the lights when you go into a dark area. Replace any light bulbs as soon as they burn out. Set up your furniture so you have a clear path. Avoid moving your furniture around. If any of your floors are uneven, fix them. If there are any pets around you, be aware of where they are. Review your medicines with your doctor. Some medicines can make you feel dizzy. This can increase your chance of falling. Ask your doctor what other things that you can do to help prevent falls. This information is not intended to replace advice given to you by your health care provider. Make sure you discuss any questions you have with your health care provider. Document Released: 09/08/2009 Document Revised: 04/19/2016 Document Reviewed: 12/17/2014 Elsevier Interactive Patient Education  2017 [employer].

## 2022-09-17 ENCOUNTER — Other Ambulatory Visit: Payer: Self-pay | Admitting: Family

## 2022-10-30 ENCOUNTER — Telehealth: Payer: Self-pay

## 2022-10-30 ENCOUNTER — Other Ambulatory Visit: Payer: Self-pay | Admitting: Family

## 2022-10-30 DIAGNOSIS — I10 Essential (primary) hypertension: Secondary | ICD-10-CM

## 2022-10-30 NOTE — Telephone Encounter (Signed)
Enough pills to cover pt until appt on 11/27/22

## 2022-10-30 NOTE — Telephone Encounter (Signed)
Called pt in regards to her refill request for amlodipine.  I informed pt that she would need an appt to receive further refills as her last appt was 08-22-20.  Pt was agreeable to being booked an appt but wanted something in January.  Pt has been scheduled 11/27/22 @ 8 am. Enough pills have been sent to cover pt until appt.    Pt was also informed that labs may be ordered on this appt as well.

## 2022-11-27 ENCOUNTER — Encounter: Payer: Self-pay | Admitting: Family

## 2022-11-27 ENCOUNTER — Ambulatory Visit (INDEPENDENT_AMBULATORY_CARE_PROVIDER_SITE_OTHER): Payer: PPO | Admitting: Family

## 2022-11-27 VITALS — BP 138/86 | HR 71 | Temp 97.8°F | Ht 62.0 in | Wt 150.0 lb

## 2022-11-27 DIAGNOSIS — I159 Secondary hypertension, unspecified: Secondary | ICD-10-CM

## 2022-11-27 DIAGNOSIS — E785 Hyperlipidemia, unspecified: Secondary | ICD-10-CM | POA: Diagnosis not present

## 2022-11-27 DIAGNOSIS — L0291 Cutaneous abscess, unspecified: Secondary | ICD-10-CM | POA: Diagnosis not present

## 2022-11-27 DIAGNOSIS — K7030 Alcoholic cirrhosis of liver without ascites: Secondary | ICD-10-CM

## 2022-11-27 DIAGNOSIS — Z720 Tobacco use: Secondary | ICD-10-CM

## 2022-11-27 LAB — CBC WITH DIFFERENTIAL/PLATELET
Basophils Absolute: 0.1 10*3/uL (ref 0.0–0.1)
Basophils Relative: 0.8 % (ref 0.0–3.0)
Eosinophils Absolute: 0.3 10*3/uL (ref 0.0–0.7)
Eosinophils Relative: 3.9 % (ref 0.0–5.0)
HCT: 38.2 % (ref 36.0–46.0)
Hemoglobin: 13.1 g/dL (ref 12.0–15.0)
Lymphocytes Relative: 21.4 % (ref 12.0–46.0)
Lymphs Abs: 1.5 10*3/uL (ref 0.7–4.0)
MCHC: 34.2 g/dL (ref 30.0–36.0)
MCV: 94.7 fl (ref 78.0–100.0)
Monocytes Absolute: 0.5 10*3/uL (ref 0.1–1.0)
Monocytes Relative: 6.8 % (ref 3.0–12.0)
Neutro Abs: 4.7 10*3/uL (ref 1.4–7.7)
Neutrophils Relative %: 67.1 % (ref 43.0–77.0)
Platelets: 235 10*3/uL (ref 150.0–400.0)
RBC: 4.04 Mil/uL (ref 3.87–5.11)
RDW: 13.3 % (ref 11.5–15.5)
WBC: 7 10*3/uL (ref 4.0–10.5)

## 2022-11-27 LAB — COMPREHENSIVE METABOLIC PANEL
ALT: 11 U/L (ref 0–35)
AST: 13 U/L (ref 0–37)
Albumin: 4.3 g/dL (ref 3.5–5.2)
Alkaline Phosphatase: 45 U/L (ref 39–117)
BUN: 16 mg/dL (ref 6–23)
CO2: 26 mEq/L (ref 19–32)
Calcium: 9.5 mg/dL (ref 8.4–10.5)
Chloride: 104 mEq/L (ref 96–112)
Creatinine, Ser: 0.77 mg/dL (ref 0.40–1.20)
GFR: 75.83 mL/min (ref >60.00)
Glucose, Bld: 89 mg/dL (ref 70–99)
Potassium: 4.4 mEq/L (ref 3.5–5.1)
Sodium: 140 mEq/L (ref 135–145)
Total Bilirubin: 0.7 mg/dL (ref 0.2–1.2)
Total Protein: 6.8 g/dL (ref 6.0–8.3)

## 2022-11-27 LAB — LIPID PANEL
Cholesterol: 164 mg/dL (ref 0–200)
HDL: 49.1 mg/dL (ref >39.00)
LDL Cholesterol: 97 mg/dL (ref 0–99)
NonHDL: 114.71
Total CHOL/HDL Ratio: 3
Triglycerides: 90 mg/dL (ref 0.0–149.0)
VLDL: 18 mg/dL (ref 0.0–40.0)

## 2022-11-27 LAB — HEMOGLOBIN A1C: Hgb A1c MFr Bld: 6 % (ref 4.6–6.5)

## 2022-11-27 MED ORDER — TELMISARTAN 80 MG PO TABS: 80.0000 mg | ORAL_TABLET | Freq: Every day | ORAL | 3 refills | Status: DC

## 2022-11-27 MED ORDER — DOXYCYCLINE HYCLATE 100 MG PO TABS: 100.0000 mg | ORAL_TABLET | Freq: Two times a day (BID) | ORAL | 0 refills | Status: DC

## 2022-11-27 MED ORDER — AMLODIPINE BESYLATE 10 MG PO TABS: 10.0000 mg | ORAL_TABLET | Freq: Every day | ORAL | 3 refills | Status: DC

## 2022-11-27 NOTE — Assessment & Plan Note (Signed)
Pending lipid panel.  Continue Pravachol 60 mg qd

## 2022-11-27 NOTE — Assessment & Plan Note (Signed)
Start doxycycline.  Close follow-up in 2-weeks for recheck.

## 2022-11-27 NOTE — Patient Instructions (Signed)
Start doxycycline, antibiotic for abscess in right groin. Increase amlodipine to 10 mg daily. It is imperative that you are seen AT least twice per year for labs and monitoring. Monitor blood pressure at home and me 5-6 reading on separate days. Goal is less than 120/80, based on newest guidelines, however we certainly want to be less than 130/80;  if persistently higher, please make sooner follow up appointment so we can recheck you blood pressure and manage/ adjust medications.  I have ordered ultrasound of liver and also placed referral to [facility] pulmonology lung cancer screening program Let us know if you dont hear back within a week in regards to an appointment being scheduled.   So that you are aware, if you are [facility] MyChart user , please pay attention to your MyChart messages as you may receive a MyChart message with a phone number to call and schedule this test/appointment own your own from our referral coordinator. This is a new process so I do not want you to miss this message.  If you are not a MyChart user, you will receive a phone call.

## 2022-11-27 NOTE — Assessment & Plan Note (Signed)
Elevated. Increase amlodipine to 10mg . Continue telmisartan 80mg .

## 2022-11-27 NOTE — Assessment & Plan Note (Signed)
Reviewed patient chart extensively without any previous liver imaging found.  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  We opted for baseline liver ultrasound to evaluate extent of disease, if any.

## 2022-11-27 NOTE — Progress Notes (Signed)
Assessment & Plan:  Secondary hypertension Assessment & Plan: Elevated. Increase amlodipine to 10mg . Continue telmisartan 80mg .   Orders: -     amLODIPine Besylate; Take 1 tablet (10 mg total) by mouth daily.  Dispense: 90 tablet; Refill: 3 -     Telmisartan; Take 1 tablet (80 mg total) by mouth daily.  Dispense: 90 tablet; Refill: 3  Alcoholic cirrhosis of liver without ascites (HCC) Assessment & Plan: Reviewed patient chart extensively without any previous liver imaging found.  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  We opted for baseline liver ultrasound to evaluate extent of disease, if any.  Orders: -     CBC with Differential/Platelet -     Comprehensive metabolic panel -     Lipid panel -     Hemoglobin A1c -     US ABDOMEN LIMITED RUQ (LIVER/GB); Future  Tobacco abuse -     Ambulatory Referral for Lung Cancer Scre  Abscess Assessment & Plan: Start doxycycline.  Close follow-up in 2-weeks for recheck.   Orders: -     Doxycycline Hyclate; Take 1 tablet (100 mg total) by mouth 2 (two) times daily.  Dispense: 10 tablet; Refill: 0  Hyperlipidemia, unspecified hyperlipidemia type Assessment & Plan: Pending lipid panel.  Continue Pravachol 60 mg qd      Return precautions given.   Risks, benefits, and alternatives of the medications and treatment plan prescribed today were discussed, and patient expressed understanding.   Education regarding symptom management and diagnosis given to patient on AVS either electronically or printed.  Return in about 2 weeks (around 12/11/2022).  Mable Paris, FNP  Subjective:    Patient ID: Heather Rodgers, female    DOB: 03/09/1946, 77 y.o.   MRN: [id number]  CC: Heather Rodgers is a 76 y.o. female who presents today for follow up.   HPI: Right groin abscess x 1 month, improving. Spontaneously draining.  No h/o mrsa Red around area. No F, chills, N, v.     HTN- compliant with amlodipine 5mg , telmisartan 80mg  qd. No cp  She continues to smoke cigarettes. She has been 7 years without alcohol.   HLD- compliant Pravachol 60mg  QD.    Allergies: Penicillins Current Outpatient Medications on File Prior to Visit  Medication Sig Dispense Refill   Cholecalciferol 125 MCG (5000 UT) TABS Take 1 tablet by mouth daily.     clotrimazole-betamethasone (LOTRISONE) cream Apply 1 application topically 2 (two) times daily. 30 g 0   Glucosamine-MSM-Hyaluronic Acd (JOINT HEALTH PO) Take 1 tablet by mouth.     pravastatin (PRAVACHOL) 20 MG tablet TAKE 1 TABLET BY MOUTH DAILY 90 tablet 3   pravastatin (PRAVACHOL) 40 MG tablet TAKE 1 TABLET BY MOUTH DAILY 90 tablet 1   cetirizine (ZYRTEC) 10 MG tablet Take 10 mg by mouth daily. (Patient not taking: Reported on 11/27/2022)     No current facility-administered medications on file prior to visit.    Review of Systems  Constitutional:  Negative for chills and fever.  Respiratory:  Negative for cough.   Cardiovascular:  Negative for chest pain and palpitations.  Gastrointestinal:  Negative for nausea and vomiting.  Skin:  Positive for wound.      Objective:    BP 138/86   Pulse 71   Temp 97.8 F (36.6 C) (Oral)   Ht 5\' 2"  (1.575 m)   Wt 150 lb (68 kg)   SpO2  98%   BMI 27.44 kg/m  BP Readings from Last 3 Encounters:  11/27/22 138/86  06/26/22 (!) 161/94  11/22/20 (!) 154/77   Wt Readings from Last 3 Encounters:  11/27/22 150 lb (68 kg)  08/28/22 149 lb (67.6 kg)  08/14/21 149 lb (67.6 kg)    Physical Exam Vitals reviewed.  Constitutional:      Appearance: She is well-developed.  Eyes:     Conjunctiva/sclera: Conjunctivae normal.  Cardiovascular:     Rate and Rhythm: Normal rate and regular rhythm.     Pulses: Normal pulses.     Heart sounds: Normal heart sounds.  Pulmonary:     Effort: Pulmonary effort is normal.     Breath sounds: Normal breath sounds. No wheezing, rhonchi or rales.   Skin:    General: Skin is warm and dry.          Comments: Erythema right groin.Indurated and sebaceous material present. spontaneous drainage.  Neurological:     Mental Status: She is alert.  Psychiatric:        Speech: Speech normal.        Behavior: Behavior normal.        Thought Content: Thought content normal.

## 2022-12-03 ENCOUNTER — Ambulatory Visit
Admission: RE | Admit: 2022-12-03 | Discharge: 2022-12-03 | Disposition: A | Discharge status: Home / Self Care | Payer: PPO | Source: Ambulatory Visit | Attending: Family | Admitting: Family

## 2022-12-03 DIAGNOSIS — K7030 Alcoholic cirrhosis of liver without ascites: Secondary | ICD-10-CM | POA: Diagnosis not present

## 2022-12-03 DIAGNOSIS — N281 Cyst of kidney, acquired: Secondary | ICD-10-CM | POA: Diagnosis not present

## 2022-12-03 DIAGNOSIS — K7460 Unspecified cirrhosis of liver: Secondary | ICD-10-CM | POA: Diagnosis not present

## 2022-12-06 ENCOUNTER — Other Ambulatory Visit: Payer: Self-pay | Admitting: Family

## 2022-12-06 DIAGNOSIS — K7030 Alcoholic cirrhosis of liver without ascites: Secondary | ICD-10-CM

## 2022-12-12 ENCOUNTER — Ambulatory Visit (INDEPENDENT_AMBULATORY_CARE_PROVIDER_SITE_OTHER): Payer: PPO | Admitting: Family

## 2022-12-12 ENCOUNTER — Encounter: Payer: Self-pay | Admitting: Family

## 2022-12-12 VITALS — BP 130/75 | HR 63 | Temp 97.6°F | Ht 62.0 in | Wt 159.1 lb

## 2022-12-12 DIAGNOSIS — K7030 Alcoholic cirrhosis of liver without ascites: Secondary | ICD-10-CM | POA: Diagnosis not present

## 2022-12-12 DIAGNOSIS — I159 Secondary hypertension, unspecified: Secondary | ICD-10-CM | POA: Diagnosis not present

## 2022-12-12 NOTE — Progress Notes (Signed)
Assessment & Plan:  Secondary hypertension Assessment & Plan: Improved. Continue amlodipine 10mg , telmisartan 80mg  qd.  Encouraged her to trial taking both medications at bedtime for better blood pressure control.    Alcoholic cirrhosis of liver without ascites ([place]) Assessment & Plan: Discussed liver ultrasound at length with patient today and importance  in regards to surveillance with GI.  She will call [facility] to schedule appointment as they have called her.      Return precautions given.   Risks, benefits, and alternatives of the medications and treatment plan prescribed today were discussed, and patient expressed understanding.   Education regarding symptom management and diagnosis given to patient on AVS either electronically or printed.  Return in about 6 months (around 06/12/2023).  Heather Paris, FNP  Subjective:    Patient ID: Heather Rodgers, female    DOB: [birth year], 77 y.o.   MRN: [id number]  CC: Heather Rodgers is a 77 y.o. female who presents today for follow up.   HPI: Feels well today.  No new complaints   HTN- compliant with amlodipine 10mg  at lunch, telmisartan 80mg   BP at home 149/86 prior to medications. After medications this morning BP 134/74.   Right groin abscess has resolved on doxycycline . No purulent discharge   Allergies: Penicillins Current Outpatient Medications on File Prior to Visit  Medication Sig Dispense Refill   amLODipine (NORVASC) 10 MG tablet Take 1 tablet (10 mg total) by mouth daily. 90 tablet 3   Cholecalciferol 125 MCG (5000 UT) TABS Take 1 tablet by mouth daily.     Glucosamine-MSM-Hyaluronic Acd (JOINT HEALTH PO) Take 1 tablet by mouth.     pravastatin (PRAVACHOL) 20 MG tablet TAKE 1 TABLET BY MOUTH DAILY 90 tablet 3   pravastatin (PRAVACHOL) 40 MG tablet TAKE 1 TABLET BY MOUTH DAILY 90 tablet 1   telmisartan (MICARDIS) 80 MG tablet Take 1 tablet (80 mg total) by mouth daily. 90  tablet 3   cetirizine (ZYRTEC) 10 MG tablet Take 10 mg by mouth daily. (Patient not taking: Reported on 12/12/2022)     clotrimazole-betamethasone (LOTRISONE) cream Apply 1 application topically 2 (two) times daily. 30 g 0   doxycycline (VIBRA-TABS) 100 MG tablet Take 1 tablet (100 mg total) by mouth 2 (two) times daily. (Patient not taking: Reported on 12/12/2022) 10 tablet 0   No current facility-administered medications on file prior to visit.    Review of Systems  Constitutional:  Negative for chills and fever.  Respiratory:  Negative for cough.   Cardiovascular:  Negative for chest pain and palpitations.  Gastrointestinal:  Negative for nausea and vomiting.      Objective:    BP 130/75   Pulse 63   Temp 97.6 F (36.4 C) (Oral)   Ht 5\' 2"  (1.575 m)   Wt 159 lb 1.6 oz (72.2 kg)   SpO2 98%   BMI 29.10 kg/m  BP Readings from Last 3 Encounters:  12/12/22 130/75  11/27/22 138/86  06/26/22 (!) 161/94   Wt Readings from Last 3 Encounters:  12/12/22 159 lb 1.6 oz (72.2 kg)  11/27/22 150 lb (68 kg)  08/28/22 149 lb (67.6 kg)    Physical Exam Vitals reviewed.  Constitutional:      Appearance: She is well-developed.  Eyes:     Conjunctiva/sclera: Conjunctivae normal.  Cardiovascular:     Rate and Rhythm: Normal rate and regular rhythm.     Pulses: Normal pulses.     Heart sounds:  Normal heart sounds.  Pulmonary:     Effort: Pulmonary effort is normal.     Breath sounds: Normal breath sounds. No wheezing, rhonchi or rales.  Skin:    General: Skin is warm and dry.  Neurological:     Mental Status: She is alert.  Psychiatric:        Speech: Speech normal.        Behavior: Behavior normal.        Thought Content: Thought content normal.

## 2022-12-12 NOTE — Assessment & Plan Note (Signed)
Discussed liver ultrasound at length with patient today and importance  in regards to surveillance with GI.  She will call [facility] to schedule appointment as they have called her.

## 2022-12-12 NOTE — Assessment & Plan Note (Addendum)
Improved. Continue amlodipine 10mg , telmisartan 80mg  qd.  Encouraged her to trial taking both medications at bedtime for better blood pressure control.

## 2022-12-12 NOTE — Patient Instructions (Addendum)
As discussed, it is very important to make a follow up with [facility] Please call below  [phone]

## 2023-01-15 ENCOUNTER — Other Ambulatory Visit: Payer: Self-pay | Admitting: Family

## 2023-01-15 DIAGNOSIS — E785 Hyperlipidemia, unspecified: Secondary | ICD-10-CM

## 2023-02-13 ENCOUNTER — Ambulatory Visit (INDEPENDENT_AMBULATORY_CARE_PROVIDER_SITE_OTHER): Payer: PPO | Admitting: Family

## 2023-02-13 ENCOUNTER — Encounter: Payer: Self-pay | Admitting: Family

## 2023-02-13 ENCOUNTER — Ambulatory Visit
Admission: RE | Admit: 2023-02-13 | Discharge: 2023-02-13 | Disposition: A | Discharge status: Home / Self Care | Payer: PPO | Source: Ambulatory Visit | Attending: Family | Admitting: Family

## 2023-02-13 VITALS — BP 132/78 | HR 81 | Temp 98.0°F | Ht 62.0 in | Wt 156.8 lb

## 2023-02-13 DIAGNOSIS — Z720 Tobacco use: Secondary | ICD-10-CM | POA: Diagnosis not present

## 2023-02-13 DIAGNOSIS — I159 Secondary hypertension, unspecified: Secondary | ICD-10-CM | POA: Diagnosis not present

## 2023-02-13 DIAGNOSIS — M7989 Other specified soft tissue disorders: Secondary | ICD-10-CM | POA: Diagnosis not present

## 2023-02-13 MED ORDER — AMLODIPINE BESYLATE 5 MG PO TABS: 5.0000 mg | ORAL_TABLET | Freq: Every day | ORAL | 3 refills | Status: DC

## 2023-02-13 MED ORDER — HYDROCHLOROTHIAZIDE 12.5 MG PO CAPS: 12.5000 mg | ORAL_CAPSULE | Freq: Every day | ORAL | 3 refills | Status: DC

## 2023-02-13 MED ORDER — NICOTINE 14 MG/24HR TD PT24: 14.0000 mg | MEDICATED_PATCH | Freq: Every day | TRANSDERMAL | 1 refills | Status: AC

## 2023-02-13 NOTE — Progress Notes (Signed)
Assessment & Plan:  Left leg swelling Assessment & Plan: Unilateral left leg swelling.  Discussed with patient most likely related to increase of amlodipine.  However since unilateral, we have opted to pursue stat ultrasound to rule out DVT.  Orders: -     US Venous Img Lower Unilateral Left (DVT); Future  Tobacco abuse -     Nicotine; Place 1 patch (14 mg total) onto the skin daily.  Dispense: 28 patch; Refill: 1  Secondary hypertension Assessment & Plan: Decrease amlodipine to 5 mg daily and monitor lower extremity edema.  Continue telmisartan 80 mg daily.  Start hydrochlorothiazide 12.5 mg daily.  Orders: -     hydroCHLOROthiazide; Take 1 capsule (12.5 mg total) by mouth daily.  Dispense: 90 capsule; Refill: 3 -     Basic metabolic panel; Future -     amLODIPine Besylate; Take 1 tablet (5 mg total) by mouth daily.  Dispense: 90 tablet; Refill: 3     Return precautions given.   Risks, benefits, and alternatives of the medications and treatment plan prescribed today were discussed, and patient expressed understanding.   Education regarding symptom management and diagnosis given to patient on AVS either electronically or printed.  Return in about 6 weeks (around 03/27/2023) for BP check with provider.  Mable Paris, FNP  Subjective:    Patient ID: Heather Rodgers, female    DOB: 05-14-1946, 77 y.o.   MRN: [id number]  CC: Heather Rodgers is a 77 y.o. female who presents today for an acute visit.    HPI: Complains left leg swelling over the past couple months.  No leg pain, shortness of breath, orthopnea, chest pain, rash. No recent travel.  She has no history of cancer.  No recent hospitalization or immobilization.  Compliant amlodipine 10mg  which was increased 2 months ago  She is compliant with telmisartan 80 mg daily  Allergies: Penicillins Current Outpatient Medications on File Prior to Visit  Medication Sig Dispense Refill   Cholecalciferol  125 MCG (5000 UT) TABS Take 1 tablet by mouth daily.     Glucosamine-MSM-Hyaluronic Acd (JOINT HEALTH PO) Take 1 tablet by mouth.     pravastatin (PRAVACHOL) 20 MG tablet TAKE 1 TABLET BY MOUTH DAILY 90 tablet 3   pravastatin (PRAVACHOL) 40 MG tablet TAKE 1 TABLET BY MOUTH DAILY 90 tablet 1   telmisartan (MICARDIS) 80 MG tablet Take 1 tablet (80 mg total) by mouth daily. 90 tablet 3   No current facility-administered medications on file prior to visit.    Review of Systems  Constitutional:  Negative for chills and fever.  Respiratory:  Negative for cough.   Cardiovascular:  Positive for leg swelling. Negative for chest pain and palpitations.  Gastrointestinal:  Negative for nausea and vomiting.  Skin:  Negative for rash and wound.      Objective:    BP 132/78   Pulse 81   Temp 98 F (36.7 C) (Oral)   Ht 5\' 2"  (1.575 m)   Wt 156 lb 12.8 oz (71.1 kg)   SpO2 98%   BMI 28.68 kg/m   BP Readings from Last 3 Encounters:  02/13/23 132/78  12/12/22 130/75  11/27/22 138/86   Wt Readings from Last 3 Encounters:  02/13/23 156 lb 12.8 oz (71.1 kg)  12/12/22 159 lb 1.6 oz (72.2 kg)  11/27/22 150 lb (68 kg)    Physical Exam Vitals reviewed.  Constitutional:      Appearance: She is well-developed.  Eyes:  Conjunctiva/sclera: Conjunctivae normal.  Cardiovascular:     Rate and Rhythm: Normal rate and regular rhythm.     Pulses: Normal pulses.     Heart sounds: Normal heart sounds.     Comments: RLE nonpitting pedal edema which extends to mid calf . No palpable cords or masses. No erythema or increased warmth. No asymmetry in calf size when compared bilaterally LE hair growth symmetric and present. No discoloration or varicosities noted. LE warm and palpable pedal pulses.  Pulmonary:     Effort: Pulmonary effort is normal.     Breath sounds: Normal breath sounds. No wheezing, rhonchi or rales.  Musculoskeletal:     Right lower leg: No edema.     Left lower leg: 1+ Edema  present.  Skin:    General: Skin is warm and dry.  Neurological:     Mental Status: She is alert.  Psychiatric:        Speech: Speech normal.        Behavior: Behavior normal.        Thought Content: Thought content normal.

## 2023-02-13 NOTE — Assessment & Plan Note (Signed)
Decrease amlodipine to 5 mg daily and monitor lower extremity edema.  Continue telmisartan 80 mg daily.  Start hydrochlorothiazide 12.5 mg daily.

## 2023-02-13 NOTE — Patient Instructions (Signed)
Please decrease amlodipine from 10 mg daily to 5 mg daily.  Please continue telmisartan 80 mg daily.  Please start hydrochlorothiazide 12.5 mg daily for additional blood pressure control.  This is also a diuretic. I also provided you with nicotine patches.  Please let me know if the dose needs to be adjusted.

## 2023-02-13 NOTE — Assessment & Plan Note (Signed)
Unilateral left leg swelling.  Discussed with patient most likely related to increase of amlodipine.  However since unilateral, we have opted to pursue stat ultrasound to rule out DVT.

## 2023-06-12 ENCOUNTER — Ambulatory Visit: Payer: PPO | Admitting: Family

## 2023-06-18 ENCOUNTER — Encounter: Payer: Self-pay | Admitting: Family

## 2023-06-18 ENCOUNTER — Ambulatory Visit (INDEPENDENT_AMBULATORY_CARE_PROVIDER_SITE_OTHER): Payer: PPO | Admitting: Family

## 2023-06-18 VITALS — BP 130/70 | HR 68 | Temp 98.0°F | Ht 62.0 in | Wt 158.8 lb

## 2023-06-18 DIAGNOSIS — I159 Secondary hypertension, unspecified: Secondary | ICD-10-CM | POA: Diagnosis not present

## 2023-06-18 DIAGNOSIS — K838 Other specified diseases of biliary tract: Secondary | ICD-10-CM | POA: Diagnosis not present

## 2023-06-18 DIAGNOSIS — K7030 Alcoholic cirrhosis of liver without ascites: Secondary | ICD-10-CM

## 2023-06-18 DIAGNOSIS — R7309 Other abnormal glucose: Secondary | ICD-10-CM | POA: Diagnosis not present

## 2023-06-18 DIAGNOSIS — M7989 Other specified soft tissue disorders: Secondary | ICD-10-CM

## 2023-06-18 LAB — POCT GLYCOSYLATED HEMOGLOBIN (HGB A1C): Hemoglobin A1C: 5.6 % (ref 4.0–5.6)

## 2023-06-18 NOTE — Patient Instructions (Addendum)
Compression stockings - low grade around 20 mmHg

## 2023-06-18 NOTE — Assessment & Plan Note (Signed)
Chronic, stable.  Continue amlodipine 5 mg daily,telmisartan 80 mg, hydrochlorothiazide 12.5 mg

## 2023-06-18 NOTE — Assessment & Plan Note (Signed)
Discussed venous insufficiency and strongly encouraged her to try compression stockings.  We decided to defer stopping amlodipine and trial compression stockings first. Patient in agreement.  Will monitor

## 2023-06-18 NOTE — Assessment & Plan Note (Addendum)
Discussed again my concern with the dilated bile duct, evidence of cirrhosis and hepatic cyst.I have sent staff message to Dr Tobi Bastos regarding repeat US RUQ versus MRI liver and MRCP.  He advised MRCP to evaluate dilated bile duct and hepatic cyst. Will plan to screen for HAV, HAB at follow up and update vaccination accordingly.

## 2023-06-18 NOTE — Progress Notes (Unsigned)
   Assessment & Plan:  Elevated glucose -     POCT glycosylated hemoglobin (Hb A1C)  Alcoholic cirrhosis of liver without ascites (HCC) Assessment & Plan: Discussed again my concern with the dilated bile duct, evidence of cirrhosis and hepatic cyst.I have sent staff message to Dr Tobi Bastos regarding repeat US RUQ versus MRI liver and MRCP. Will plan to screen for HAV, HAB at follow up and update vaccination accordingly.   Secondary hypertension Assessment & Plan: Chronic, stable.  Continue amlodipine 5 mg daily,telmisartan 80 mg, hydrochlorothiazide 12.5 mg   Left leg swelling Assessment & Plan: Discussed venous insufficiency and strongly encouraged her to try compression stockings.  We decided to defer stopping amlodipine and trial compression stockings first. Patient in agreement.  Will monitor      Return precautions given.   Risks, benefits, and alternatives of the medications and treatment plan prescribed today were discussed, and patient expressed understanding.   Education regarding symptom management and diagnosis given to patient on AVS either electronically or printed.  Return in about 6 months (around 12/19/2023).  [name], FNP  Subjective:    Patient ID: Heather Rodgers, female    DOB: [birth year], 77 y.o.   MRN: [id number]  CC: Heather Rodgers is a 77 y.o. female who presents today for follow up.   HPI: Left ankle swelling is unchanged. No improvement with elevation.    Previously  amlodipine decreased to 5 mg daily due to left leg swelling.  Compliant with telmisartan 80 mg, hydrochlorothiazide 12.5 mg  [facility] unable to leave message 12/13/22 ; referred for abnormal right upper quadrant ultrasound indicating dilatation common bile duct, liver cyst, cirrhosis  Allergies: Penicillins Current Outpatient Medications on File Prior to Visit  Medication Sig Dispense Refill   amLODipine (NORVASC) 5 MG tablet Take 1 tablet (5  mg total) by mouth daily. 90 tablet 3   Cholecalciferol 125 MCG (5000 UT) TABS Take 1 tablet by mouth daily.     Glucosamine-MSM-Hyaluronic Acd (JOINT HEALTH PO) Take 1 tablet by mouth.     hydrochlorothiazide (MICROZIDE) 12.5 MG capsule Take 1 capsule (12.5 mg total) by mouth daily. 90 capsule 3   nicotine (NICODERM CQ - DOSED IN MG/24 HOURS) 14 mg/24hr patch Place 1 patch (14 mg total) onto the skin daily. 28 patch 1   pravastatin (PRAVACHOL) 20 MG tablet TAKE 1 TABLET BY MOUTH DAILY 90 tablet 3   pravastatin (PRAVACHOL) 40 MG tablet TAKE 1 TABLET BY MOUTH DAILY 90 tablet 1   telmisartan (MICARDIS) 80 MG tablet Take 1 tablet (80 mg total) by mouth daily. 90 tablet 3   No current facility-administered medications on file prior to visit.    Review of Systems    Objective:    BP 130/70   Pulse 68   Temp 98 F (36.7 C) (Oral)   Ht 5\' 2"  (1.575 m)   Wt 158 lb 12.8 oz (72 kg)   SpO2 98%   BMI 29.04 kg/m  BP Readings from Last 3 Encounters:  06/18/23 130/70  02/13/23 132/78  12/12/22 130/75   Wt Readings from Last 3 Encounters:  06/18/23 158 lb 12.8 oz (72 kg)  02/13/23 156 lb 12.8 oz (71.1 kg)  12/12/22 159 lb 1.6 oz (72.2 kg)    Physical Exam

## 2023-06-19 ENCOUNTER — Encounter: Payer: Self-pay | Admitting: Family

## 2023-06-19 ENCOUNTER — Telehealth: Payer: Self-pay | Admitting: Family

## 2023-06-19 NOTE — Telephone Encounter (Signed)
Spoke to patient and went over notes with her pt did confirm that she does not have any metal or pacemaker inside of her Consulted with GI Dr Tobi Bastos   He advised MRI with dye of liver, bile duct called MRCP   I have ordered  Let us know if you dont hear back within a week in regards to an appointment being scheduled. Pt verbalized undestanding

## 2023-06-19 NOTE — Telephone Encounter (Signed)
-----   Message from [name] sent at 06/19/2023 12:25 PM EDT ----- Hi  Thanks for reaching out. Based on below suggest MRCP - get it done once and put matter to rest , if you do RUQ and is equivocal then have to again do MRCP and there is a doubt always about if it could be like a mass in the head of the pancreas based on her age causing ductal obstruction .    Regards  Kiran ----- Message ----- From: Allegra Grana, FNP Sent: 06/18/2023  12:10 PM EDT To: [name], MD  Dr Tobi Bastos,   I initially referred this patient to your group however she has been reluctant to see a specialist.   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  She has no abdominal pain or nausea with eating. Her liver enzymes, INR have all been normal.   Would it be appropriate to have repeat US ruq 6 months after prior to monitor cyst or would MRI liver offer more information?   I worry about dilated bile duct - would you strongly encourage MRCP?  Happy again to insist consult with you of course.    Claris Che

## 2023-06-19 NOTE — Telephone Encounter (Signed)
Call pt Consulted with GI Dr Tobi Bastos  He advised MRI with dye of liver, bile duct called MRCP  I have ordered  Please confirm no metal or pacemaker in the body  Let us know if you dont hear back within a week in regards to an appointment being scheduled.   So that you are aware, if you are [facility] MyChart user , please pay attention to your MyChart messages as you may receive a MyChart message with a phone number to call and schedule this test/appointment own your own from our referral coordinator. This is a new process so I do not want you to miss this message.  If you are not a MyChart user, you will receive a phone call.

## 2023-06-23 ENCOUNTER — Other Ambulatory Visit: Payer: Self-pay | Admitting: Family

## 2023-06-23 DIAGNOSIS — F40240 Claustrophobia: Secondary | ICD-10-CM

## 2023-06-23 MED ORDER — ALPRAZOLAM 0.5 MG PO TABS: 0.5000 mg | ORAL_TABLET | Freq: Once | ORAL | 0 refills | Status: AC

## 2023-06-25 ENCOUNTER — Ambulatory Visit
Admission: RE | Admit: 2023-06-25 | Discharge: 2023-06-25 | Disposition: A | Discharge status: Home / Self Care | Payer: PPO | Source: Ambulatory Visit | Attending: Family | Admitting: Family

## 2023-06-25 ENCOUNTER — Other Ambulatory Visit: Payer: Self-pay | Admitting: Family

## 2023-06-25 DIAGNOSIS — K8689 Other specified diseases of pancreas: Secondary | ICD-10-CM | POA: Diagnosis not present

## 2023-06-25 DIAGNOSIS — K7030 Alcoholic cirrhosis of liver without ascites: Secondary | ICD-10-CM | POA: Insufficient documentation

## 2023-06-25 DIAGNOSIS — K838 Other specified diseases of biliary tract: Secondary | ICD-10-CM | POA: Insufficient documentation

## 2023-06-25 MED ORDER — GADOBUTROL 1 MMOL/ML IV SOLN
7.0000 mL | Freq: Once | INTRAVENOUS | Status: AC | PRN
  Administered 2023-06-25: 7 mL via INTRAVENOUS

## 2023-06-26 ENCOUNTER — Encounter: Payer: Self-pay | Admitting: Family

## 2023-06-26 ENCOUNTER — Other Ambulatory Visit: Payer: PPO

## 2023-07-01 ENCOUNTER — Other Ambulatory Visit: Payer: Self-pay | Admitting: Family

## 2023-07-01 ENCOUNTER — Encounter: Payer: Self-pay | Admitting: Family

## 2023-07-01 DIAGNOSIS — I159 Secondary hypertension, unspecified: Secondary | ICD-10-CM

## 2023-07-01 MED ORDER — HYDROCHLOROTHIAZIDE 25 MG PO TABS: 25.0000 mg | ORAL_TABLET | Freq: Every day | ORAL | 3 refills | Status: DC

## 2023-07-02 ENCOUNTER — Encounter: Payer: Self-pay | Admitting: Family

## 2023-07-03 ENCOUNTER — Other Ambulatory Visit: Payer: Self-pay | Admitting: Family

## 2023-07-03 DIAGNOSIS — K7030 Alcoholic cirrhosis of liver without ascites: Secondary | ICD-10-CM

## 2023-07-16 ENCOUNTER — Other Ambulatory Visit (INDEPENDENT_AMBULATORY_CARE_PROVIDER_SITE_OTHER): Payer: PPO

## 2023-07-16 DIAGNOSIS — I159 Secondary hypertension, unspecified: Secondary | ICD-10-CM | POA: Diagnosis not present

## 2023-07-16 LAB — BASIC METABOLIC PANEL
BUN: 16 mg/dL (ref 6–23)
CO2: 27 mEq/L (ref 19–32)
Calcium: 9.7 mg/dL (ref 8.4–10.5)
Chloride: 100 mEq/L (ref 96–112)
Creatinine, Ser: 0.95 mg/dL (ref 0.40–1.20)
GFR: 58.67 mL/min — ABNORMAL LOW (ref >60.00)
Glucose, Bld: 100 mg/dL — ABNORMAL HIGH (ref 70–99)
Potassium: 4.1 mEq/L (ref 3.5–5.1)
Sodium: 136 mEq/L (ref 135–145)

## 2023-08-22 ENCOUNTER — Other Ambulatory Visit: Payer: Self-pay | Admitting: Family

## 2023-08-22 DIAGNOSIS — E785 Hyperlipidemia, unspecified: Secondary | ICD-10-CM

## 2023-09-02 ENCOUNTER — Ambulatory Visit (INDEPENDENT_AMBULATORY_CARE_PROVIDER_SITE_OTHER): Payer: PPO | Admitting: *Deleted

## 2023-09-02 ENCOUNTER — Other Ambulatory Visit: Payer: Self-pay

## 2023-09-02 ENCOUNTER — Ambulatory Visit: Payer: PPO | Admitting: Gastroenterology

## 2023-09-02 ENCOUNTER — Encounter: Payer: Self-pay | Admitting: Gastroenterology

## 2023-09-02 VITALS — Ht 62.25 in | Wt 155.0 lb

## 2023-09-02 VITALS — BP 165/78 | HR 66 | Temp 98.3°F | Ht 62.25 in | Wt 161.6 lb

## 2023-09-02 DIAGNOSIS — Z0000 Encounter for general adult medical examination without abnormal findings: Secondary | ICD-10-CM | POA: Diagnosis not present

## 2023-09-02 DIAGNOSIS — Z1231 Encounter for screening mammogram for malignant neoplasm of breast: Secondary | ICD-10-CM

## 2023-09-02 DIAGNOSIS — K7030 Alcoholic cirrhosis of liver without ascites: Secondary | ICD-10-CM

## 2023-09-02 DIAGNOSIS — K7460 Unspecified cirrhosis of liver: Secondary | ICD-10-CM

## 2023-09-02 NOTE — Progress Notes (Signed)
[name] MD, MRCP(U.K) [address]  [address]  [place], [place] [address]  Main: [phone]  Fax: [phone]   Gastroenterology Consultation  Referring Provider:     Allegra Grana, FNP Primary Care Physician:  Allegra Grana, FNP Primary Gastroenterologist:  Dr. [name]  Reason for Consultation:     alcoholic liver cirrhosis        HPI:   Heather Rodgers is a 77 y.o. y/o female referred for consultation & management  by  Allegra Grana, FNP.   She says that she has no family history of liver disease used to drink a lot of alcohol did not wish to quantify the quantity but said she did that for 10 years but stopped about 8 years back and has never drank any alcohol since denies any NSAID use denies any incarceration use of tattoos or illegal drug use.  No military service no blood transfusions.  No issues with sleep regular bowel movements no confusion.  11/27/2022: Hb 13.1 , albumin 4.3, , normal LFT's , plt 235.  12/03/2022: RUQ USG: CBD 10 mm , cirrhotic liver , liver cyst 07/01/2023: MRCP:  CBD dilated 8 mm , no clear obstructive masses seen - no HCC, liver cirrhosis   Past Medical History:  Diagnosis Date   Alcohol abuse 2017   rehab 11/2015   Anemia    Arthritis    Colitis 2017   c.difficile   Hypertension    Kidney stones     Past Surgical History:  Procedure Laterality Date   NO PAST SURGERIES      Prior to Admission medications   Medication Sig Start Date End Date Taking? Authorizing Provider  ALPRAZolam Prudy Feeler) 0.5 MG tablet Take 0.5 mg by mouth daily. 06/24/23   [provider]  Cholecalciferol 125 MCG (5000 UT) TABS Take 1 tablet by mouth daily.    [provider]  Glucosamine-MSM-Hyaluronic Acd (JOINT HEALTH PO) Take 1 tablet by mouth.    [provider]  hydrochlorothiazide (HYDRODIURIL) 25 MG tablet Take 1 tablet (25 mg total) by mouth daily. 07/01/23   Allegra Grana, FNP  nicotine (NICODERM CQ  - DOSED IN MG/24 HOURS) 14 mg/24hr patch Place 1 patch (14 mg total) onto the skin daily. 02/13/23   Allegra Grana, FNP  pravastatin (PRAVACHOL) 20 MG tablet TAKE 1 TABLET BY MOUTH DAILY 09/18/22   Allegra Grana, FNP  pravastatin (PRAVACHOL) 40 MG tablet TAKE 1 TABLET BY MOUTH DAILY 08/22/23   Allegra Grana, FNP  telmisartan (MICARDIS) 80 MG tablet Take 1 tablet (80 mg total) by mouth daily. 11/27/22   Allegra Grana, FNP    Family History  Problem Relation Age of Onset   Heart disease Mother    Hypertension Mother    Arthritis Father    Heart disease Father    Hypertension Father    Kidney disease Neg Hx      Social History   Tobacco Use   Smoking status: Some Days    Current packs/day: 0.00    Average packs/day: 0.5 packs/day for 47.0 years (23.5 ttl pk-yrs)    Types: Cigarettes    Start date: 09/19/1995    Last attempt to quit: 12/01/2015    Years since quitting: 7.7   Smokeless tobacco: Never  Vaping Use   Vaping status: Never Used  Substance Use Topics   Alcohol use: No    Alcohol/week: 0.0 standard drinks of alcohol  Comment: sober since late December 2016   Drug use: No    Allergies as of 09/02/2023 - Review Complete 09/02/2023  Allergen Reaction Noted   Penicillins Hives 09/19/2015    Review of Systems:    All systems reviewed and negative except where noted in HPI.   Physical Exam:  BP (!) 165/78   Pulse 66   Temp 98.3 F (36.8 C) (Oral)   Ht 5' 2.25" (1.581 m)   Wt 161 lb 9.6 oz (73.3 kg)   BMI 29.32 kg/m  No LMP recorded. Patient is postmenopausal. Psych:  Alert and cooperative. Normal mood and affect. General:   Alert,  Well-developed, well-nourished, pleasant and cooperative in NAD Head:  Normocephalic and atraumatic. Eyes:  Sclera clear, no icterus.   Conjunctiva pink. Abdomen:  Normal bowel sounds.  No bruits.  Soft, non-tender and non-distended without masses, hepatosplenomegaly or hernias noted.  No guarding or rebound  tenderness.    Neurologic:  Alert and oriented x3;  grossly normal neurologically. Psych:  Alert and cooperative. Normal mood and affect.  Imaging Studies: No results found.  Assessment and Plan:   Heather Rodgers is a 77 y.o. y/o female has been referred for liver cirrhosis very likely due to alcohol use in the past which she has stopped labs suggest preserved liver function low pretest probability of esophageal varices   Plan  Full autoimmune and viral hepatitis labs  RUQ USG and AFP in 12/2023 EGD to screen for Varices Labs for MELD score Stop all alcohol use, no nsaid use Vaccinations after labs based on immunity   I have discussed alternative options, risks & benefits,  which include, but are not limited to, bleeding, infection, perforation,respiratory complication & drug reaction.  The patient agrees with this plan & written consent will be obtained.     Follow up in 6 months with [name]  Dr [name] MD,MRCP(U.K)

## 2023-09-02 NOTE — Patient Instructions (Signed)
  Ms. Keadle , Thank you for taking time to come for your Medicare Wellness Visit. I appreciate your ongoing commitment to your health goals. Please review the following plan we discussed and let me know if I can assist you in the future.   Referrals/Orders/Follow-Ups/Clinician Recommendations: Mammogram order placed for [facility] information given   This is a list of the screening recommended for you and due dates:  Health Maintenance  Topic Date Due   Zoster (Shingles) Vaccine (1 of 2) Never done   Screening for Lung Cancer  01/29/2018   Mammogram  05/23/2023   Flu Shot  06/27/2023   DTaP/Tdap/Td vaccine (2 - Tdap) 05/10/2024   Colon Cancer Screening  07/12/2024   Medicare Annual Wellness Visit  09/01/2024   Pneumonia Vaccine  Completed   DEXA scan (bone density measurement)  Completed   Hepatitis C Screening  Completed   HPV Vaccine  Aged Out   COVID-19 Vaccine  Discontinued    Advanced directives: (Copy Requested) Please bring a copy of your health care power of attorney and living will to the office to be added to your chart at your convenience.  Next Medicare Annual Wellness Visit scheduled for next year: Yes 09/07/24 @ 8:15

## 2023-09-02 NOTE — Progress Notes (Signed)
Subjective:   Heather Rodgers is a 77 y.o. female who presents for Medicare Annual (Subsequent) preventive examination.  Visit Complete: Virtual  I connected with  Heather Rodgers on 09/02/23 by a audio enabled telemedicine application and verified that I am speaking with the correct person using two identifiers.  Patient Location: Home  Provider Location: Office/Clinic  I discussed the limitations of evaluation and management by telemedicine. The patient expressed understanding and agreed to proceed.  Because this visit was a virtual/telehealth visit, some criteria may be missing or patient reported. Any vitals not documented were not able to be obtained and vitals that have been documented are patient reported.    Cardiac Risk Factors include: advanced age (>11men, >41 women);dyslipidemia;hypertension;smoking/ tobacco exposure     Objective:    Today's Vitals   09/02/23 1024  Weight: 155 lb (70.3 kg)  Height: 5' 2.25" (1.581 m)   Body mass index is 28.12 kg/m.     09/02/2023   10:38 AM 08/28/2022    3:17 PM 08/14/2021    9:20 AM 06/13/2020    1:44 PM 08/06/2017    8:15 AM 12/02/2015    2:36 AM 12/02/2015    2:30 AM  Advanced Directives  Does Patient Have a Medical Advance Directive? Yes No No Yes Yes No No  Type of Estate agent of [place];Living will   Healthcare Power of [employer] Power of Attorney    Does patient want to make changes to medical advance directive?    No - Patient declined No - Patient declined    Copy of Healthcare Power of Attorney in Chart? No - copy requested   No - copy requested No - copy requested    Would patient like information on creating a medical advance directive?  No - Patient declined No - Patient declined   Yes - Educational materials given Yes - Educational materials given    Current Medications (verified) Outpatient Encounter Medications as of 09/02/2023  Medication Sig    Cholecalciferol 125 MCG (5000 UT) TABS Take 1 tablet by mouth daily.   Glucosamine-MSM-Hyaluronic Acd (JOINT HEALTH PO) Take 1 tablet by mouth.   hydrochlorothiazide (HYDRODIURIL) 25 MG tablet Take 1 tablet (25 mg total) by mouth daily.   nicotine (NICODERM CQ - DOSED IN MG/24 HOURS) 14 mg/24hr patch Place 1 patch (14 mg total) onto the skin daily.   pravastatin (PRAVACHOL) 20 MG tablet TAKE 1 TABLET BY MOUTH DAILY   pravastatin (PRAVACHOL) 40 MG tablet TAKE 1 TABLET BY MOUTH DAILY   telmisartan (MICARDIS) 80 MG tablet Take 1 tablet (80 mg total) by mouth daily.   ALPRAZolam (XANAX) 0.5 MG tablet Take 0.5 mg by mouth daily.   No facility-administered encounter medications on file as of 09/02/2023.    Allergies (verified) Penicillins   History: Past Medical History:  Diagnosis Date   Alcohol abuse 2017   rehab 11/2015   Anemia    Arthritis    Colitis 2017   c.difficile   Hypertension    Kidney stones    Past Surgical History:  Procedure Laterality Date   NO PAST SURGERIES     Family History  Problem Relation Age of Onset   Heart disease Mother    Hypertension Mother    Arthritis Father    Heart disease Father    Hypertension Father    Kidney disease Neg Hx    Social History   Socioeconomic History   Marital status: Widowed  Spouse name: Not on file   Number of children: Not on file   Years of education: Not on file   Highest education level: Not on file  Occupational History   Not on file  Tobacco Use   Smoking status: Some Days    Current packs/day: 0.00    Average packs/day: 0.5 packs/day for 47.0 years (23.5 ttl pk-yrs)    Types: Cigarettes    Start date: 09/19/1995    Last attempt to quit: 12/01/2015    Years since quitting: 7.7   Smokeless tobacco: Never  Vaping Use   Vaping status: Never Used  Substance and Sexual Activity   Alcohol use: No    Alcohol/week: 0.0 standard drinks of alcohol    Comment: sober since late December 2016   Drug use: No    Sexual activity: Never  Other Topics Concern   Not on file  Social History Narrative   Widower   Employed on golf course, 3 days week   Some College   Caffeine- 1 cup of coffee daily and no soda or tea   3 children   Social Determinants of Health   Financial Resource Strain: Low Risk  (09/02/2023)   Overall Financial Resource Strain (CARDIA)    Difficulty of Paying Living Expenses: Not hard at all  Food Insecurity: No Food Insecurity (09/02/2023)   Hunger Vital Sign    Worried About Running Out of Food in the Last Year: Never true    Ran Out of Food in the Last Year: Never true  Transportation Needs: No Transportation Needs (09/02/2023)   PRAPARE - Administrator, Civil Service (Medical): No    Lack of Transportation (Non-Medical): No  Physical Activity: Insufficiently Active (09/02/2023)   Exercise Vital Sign    Days of Exercise per Week: 3 days    Minutes of Exercise per Session: 40 min  Stress: No Stress Concern Present (09/02/2023)   [employer] of Occupational Health - Occupational Stress Questionnaire    Feeling of Stress : Not at all  Social Connections: Moderately Isolated (09/02/2023)   Social Connection and Isolation Panel [NHANES]    Frequency of Communication with Friends and Family: More than three times a week    Frequency of Social Gatherings with Friends and Family: Three times a week    Attends Religious Services: More than 4 times per year    Active Member of Clubs or Organizations: No    Attends Banker Meetings: Never    Marital Status: Widowed    Tobacco Counseling Ready to quit: Not Answered Counseling given: Not Answered   Clinical Intake:  Pre-visit preparation completed: Yes  Pain : No/denies pain     BMI - recorded: 28.12 Nutritional Status: BMI 25 -29 Overweight Nutritional Risks: None Diabetes: No  How often do you need to have someone help you when you read instructions, pamphlets, or other written  materials from your doctor or pharmacy?: 1 - Never  Interpreter Needed?: No  Information entered by :: R. Asuka Dusseau LPN   Activities of Daily Living    09/02/2023   10:28 AM  In your present state of health, do you have any difficulty performing the following activities:  Hearing? 0  Vision? 0  Comment glasses  Difficulty concentrating or making decisions? 0  Walking or climbing stairs? 0  Dressing or bathing? 0  Doing errands, shopping? 0  Preparing Food and eating ? N  Using the Toilet? N  In the past six  months, have you accidently leaked urine? N  Do you have problems with loss of bowel control? N  Managing your Medications? N  Managing your Finances? N  Housekeeping or managing your Housekeeping? N    Patient Care Team: Allegra Grana, FNP as PCP - General (Family Medicine)  Indicate any recent Medical Services you may have received from other than [facility] providers in the past year (date may be approximate).     Assessment:   This is a routine wellness examination for Zella.  Hearing/Vision screen Hearing Screening - Comments:: No issues Vision Screening - Comments:: glasses   Goals Addressed             This Visit's Progress    Patient Stated       Wants to quit smoking        Depression Screen    09/02/2023   10:33 AM 06/18/2023    8:04 AM 02/13/2023    2:51 PM 12/12/2022    8:16 AM 11/27/2022    8:10 AM 08/28/2022    3:14 PM 08/14/2021    9:21 AM  PHQ 2/9 Scores  PHQ - 2 Score 0 0 0 0 0 0 0  PHQ- 9 Score 0   0 0      Fall Risk    09/02/2023   10:29 AM 06/18/2023    8:04 AM 02/13/2023    2:51 PM 12/12/2022    8:16 AM 11/27/2022    8:10 AM  Fall Risk   Falls in the past year? 0 0 0 0 0  Number falls in past yr: 0 0 0 0 0  Injury with Fall? 0 0 0 0 0  Risk for fall due to : No Fall Risks No Fall Risks No Fall Risks No Fall Risks No Fall Risks  Follow up Falls prevention discussed;Falls evaluation completed Falls evaluation completed Falls  evaluation completed Falls evaluation completed Falls evaluation completed    MEDICARE RISK AT HOME: Medicare Risk at Home Any stairs in or around the home?: Yes If so, are there any without handrails?: No Home free of loose throw rugs in walkways, pet beds, electrical cords, etc?: Yes Adequate lighting in your home to reduce risk of falls?: Yes Life alert?: No Use of a cane, walker or w/c?: No Grab bars in the bathroom?: Yes Shower chair or bench in shower?: No Elevated toilet seat or a handicapped toilet?: No      Cognitive Function:    08/06/2017    8:33 AM  MMSE - Mini Mental State Exam  Orientation to time 5  Orientation to Place 5  Registration 3  Attention/ Calculation 5  Recall 3  Language- name 2 objects 2  Language- repeat 1  Language- follow 3 step command 3  Language- read & follow direction 1  Write a sentence 1  Copy design 1  Total score 30        09/02/2023   10:38 AM 08/28/2022    3:19 PM 08/14/2021    9:22 AM 06/13/2020    1:46 PM  6CIT Screen  What Year? 0 points 0 points 0 points 0 points  What month? 0 points 0 points 0 points 0 points  What time? 0 points 0 points 0 points 0 points  Count back from 20 0 points 0 points 0 points   Months in reverse 0 points 0 points 0 points   Repeat phrase 0 points 0 points    Total Score 0 points 0  points      Immunizations Immunization History  Administered Date(s) Administered   Fluad Quad(high Dose 65+) 08/22/2020   Influenza, High Dose Seasonal PF 08/22/2016, 08/13/2017   Influenza-Unspecified 09/21/2015   Pneumococcal Conjugate-13 01/02/2017   Pneumococcal Polysaccharide-23 04/22/2013   Pneumococcal-Unspecified 04/05/2014   Td 05/10/2014    TDAP status: Up to date  Flu Vaccine status: Due, Education has been provided regarding the importance of this vaccine. Advised may receive this vaccine at local pharmacy or Health Dept. Aware to provide a copy of the vaccination record if obtained from local  pharmacy or Health Dept. Verbalized acceptance and understanding.  Pneumococcal vaccine status: Up to date  Covid-19 vaccine status: Declined, Education has been provided regarding the importance of this vaccine but patient still declined. Advised may receive this vaccine at local pharmacy or Health Dept.or vaccine clinic. Aware to provide a copy of the vaccination record if obtained from local pharmacy or Health Dept. Verbalized acceptance and understanding.  Qualifies for Shingles Vaccine? Yes   Zostavax completed No   Shingrix Completed?: No.    Education has been provided regarding the importance of this vaccine. Patient has been advised to call insurance company to determine out of pocket expense if they have not yet received this vaccine. Advised may also receive vaccine at local pharmacy or Health Dept. Verbalized acceptance and understanding.  Screening Tests Health Maintenance  Topic Date Due   Zoster Vaccines- Shingrix (1 of 2) Never done   Lung Cancer Screening  01/29/2018   MAMMOGRAM  05/23/2023   INFLUENZA VACCINE  06/27/2023   DTaP/Tdap/Td (2 - Tdap) 05/10/2024   Colonoscopy  07/12/2024   Medicare Annual Wellness (AWV)  09/01/2024   Pneumonia Vaccine 79+ Years old  Completed   DEXA SCAN  Completed   Hepatitis C Screening  Completed   HPV VACCINES  Aged Out   COVID-19 Vaccine  Discontinued    Health Maintenance  Health Maintenance Due  Topic Date Due   Zoster Vaccines- Shingrix (1 of 2) Never done   Lung Cancer Screening  01/29/2018   MAMMOGRAM  05/23/2023   INFLUENZA VACCINE  06/27/2023    Colorectal cancer screening: Type of screening: Colonoscopy. Completed 06/2014. Repeat every 10 years  Mammogram status: Ordered 09/02/23. Pt provided with contact info and advised to call to schedule appt.  Order [facility]   Bone Density status: Completed 06/2022. Results reflect: Bone density results: NORMAL. Repeat every 2 years.  Lung Cancer Screening: (Low Dose CT Chest  recommended if Age 90-80 years, 20 pack-year currently smoking OR have quit w/in 15years.) does qualify.  Order placed 11/2022    Additional Screening:  Hepatitis C Screening: does qualify; Completed 02/2014  Vision Screening: Recommended annual ophthalmology exams for early detection of glaucoma and other disorders of the eye. Is the patient up to date with their annual eye exam?  Yes  Who is the provider or what is the name of the office in which the patient attends annual eye exams? Dr. Larence Penning If pt is not established with a provider, would they like to be referred to a provider to establish care? No .   Dental Screening: Recommended annual dental exams for proper oral hygiene    Community Resource Referral / Chronic Care Management: CRR required this visit?  No   CCM required this visit?  No     Plan:     I have personally reviewed and noted the following in the patient's chart:   Medical and  social history Use of alcohol, tobacco or illicit drugs  Current medications and supplements including opioid prescriptions. Patient is not currently taking opioid prescriptions. Functional ability and status Nutritional status Physical activity Advanced directives List of other physicians Hospitalizations, surgeries, and ER visits in previous 12 months Vitals Screenings to include cognitive, depression, and falls Referrals and appointments  In addition, I have reviewed and discussed with patient certain preventive protocols, quality metrics, and best practice recommendations. A written personalized care plan for preventive services as well as general preventive health recommendations were provided to patient.     Sydell Axon, LPN   16/11/958   After Visit Summary: (MyChart) Due to this being a telephonic visit, the after visit summary with patients personalized plan was offered to patient via MyChart   Nurse Notes: None

## 2023-09-02 NOTE — Addendum Note (Signed)
Addended by: Adela Ports on: 09/02/2023 01:41 PM   Modules accepted: Orders

## 2023-09-04 LAB — CBC WITH DIFFERENTIAL/PLATELET
Basophils Absolute: 0 10*3/uL (ref 0.0–0.2)
Basos: 1 %
EOS (ABSOLUTE): 0.1 10*3/uL (ref 0.0–0.4)
Eos: 2 %
Hematocrit: 37.1 % (ref 34.0–46.6)
Hemoglobin: 12.4 g/dL (ref 11.1–15.9)
Immature Grans (Abs): 0 10*3/uL (ref 0.0–0.1)
Immature Granulocytes: 0 %
Lymphocytes Absolute: 1.5 10*3/uL (ref 0.7–3.1)
Lymphs: 25 %
MCH: 32 pg (ref 26.6–33.0)
MCHC: 33.4 g/dL (ref 31.5–35.7)
MCV: 96 fL (ref 79–97)
Monocytes Absolute: 0.4 10*3/uL (ref 0.1–0.9)
Monocytes: 7 %
Neutrophils Absolute: 3.9 10*3/uL (ref 1.4–7.0)
Neutrophils: 65 %
Platelets: 213 10*3/uL (ref 150–450)
RBC: 3.87 x10E6/uL (ref 3.77–5.28)
RDW: 12.3 % (ref 11.7–15.4)
WBC: 6 10*3/uL (ref 3.4–10.8)

## 2023-09-04 LAB — COMPREHENSIVE METABOLIC PANEL
ALT: 13 [IU]/L (ref 0–32)
AST: 16 [IU]/L (ref 0–40)
Albumin: 4.5 g/dL (ref 3.8–4.8)
Alkaline Phosphatase: 51 [IU]/L (ref 44–121)
BUN/Creatinine Ratio: 15 (ref 12–28)
BUN: 15 mg/dL (ref 8–27)
Bilirubin Total: 0.5 mg/dL (ref 0.0–1.2)
CO2: 21 mmol/L (ref 20–29)
Calcium: 9.5 mg/dL (ref 8.7–10.3)
Chloride: 102 mmol/L (ref 96–106)
Creatinine, Ser: 1 mg/dL (ref 0.57–1.00)
Globulin, Total: 2.5 g/dL (ref 1.5–4.5)
Glucose: 100 mg/dL — ABNORMAL HIGH (ref 70–99)
Potassium: 4.1 mmol/L (ref 3.5–5.2)
Sodium: 140 mmol/L (ref 134–144)
Total Protein: 7 g/dL (ref 6.0–8.5)
eGFR: 59 mL/min/{1.73_m2} — ABNORMAL LOW (ref >59)

## 2023-09-04 LAB — HEPATITIS C ANTIBODY: Hep C Virus Ab: NONREACTIVE

## 2023-09-04 LAB — CELIAC DISEASE AB SCREEN W/RFX
Antigliadin Abs, IgA: 4 U (ref 0–19)
Transglutaminase IgA: <2 U/mL (ref 0–3)

## 2023-09-04 LAB — IRON,TIBC AND FERRITIN PANEL
Ferritin: 134 ng/mL (ref 15–150)
Iron Saturation: 21 % (ref 15–55)
Iron: 69 ug/dL (ref 27–139)
Total Iron Binding Capacity: 325 ug/dL (ref 250–450)
UIBC: 256 ug/dL (ref 118–369)

## 2023-09-04 LAB — IMMUNOGLOBULINS A/E/G/M, SERUM
IgA/Immunoglobulin A, Serum: 236 mg/dL (ref 64–422)
IgE (Immunoglobulin E), Serum: 194 [IU]/mL (ref 6–495)
IgG (Immunoglobin G), Serum: 992 mg/dL (ref 586–1602)
IgM (Immunoglobulin M), Srm: 137 mg/dL (ref 26–217)

## 2023-09-04 LAB — HEPATITIS A ANTIBODY, TOTAL: hep A Total Ab: POSITIVE — AB

## 2023-09-04 LAB — MITOCHONDRIAL/SMOOTH MUSCLE AB PNL
Mitochondrial Ab: <20 U (ref 0.0–20.0)
Smooth Muscle Ab: 16 U (ref 0–19)

## 2023-09-04 LAB — PROTIME-INR
INR: 1 (ref 0.9–1.2)
Prothrombin Time: 11.6 s (ref 9.1–12.0)

## 2023-09-04 LAB — HEPATITIS B CORE ANTIBODY, TOTAL: Hep B Core Total Ab: NEGATIVE

## 2023-09-04 LAB — HEPATITIS B E ANTIBODY: Hep B E Ab: NONREACTIVE

## 2023-09-04 LAB — ALPHA-1-ANTITRYPSIN: A-1 Antitrypsin: 163 mg/dL (ref 101–187)

## 2023-09-04 LAB — HEPATITIS B E ANTIGEN: Hep B E Ag: NEGATIVE

## 2023-09-04 LAB — HEPATITIS B SURFACE ANTIBODY,QUALITATIVE: Hep B Surface Ab, Qual: NONREACTIVE

## 2023-09-04 LAB — HEPATITIS B SURFACE ANTIGEN: Hepatitis B Surface Ag: NEGATIVE

## 2023-09-04 LAB — ANTI-MICROSOMAL ANTIBODY LIVER / KIDNEY: LKM1 Ab: 0.6 U (ref 0.0–20.0)

## 2023-09-04 LAB — ANA: Anti Nuclear Antibody (ANA): NEGATIVE

## 2023-09-04 LAB — CERULOPLASMIN: Ceruloplasmin: 27.2 mg/dL (ref 19.0–39.0)

## 2023-09-04 LAB — HIV ANTIBODY (ROUTINE TESTING W REFLEX): HIV Screen 4th Generation wRfx: NONREACTIVE

## 2023-09-04 LAB — CK: Total CK: 136 U/L (ref 32–182)

## 2023-10-08 ENCOUNTER — Ambulatory Visit: Payer: PPO | Admitting: Anesthesiology

## 2023-10-08 ENCOUNTER — Other Ambulatory Visit: Payer: Self-pay

## 2023-10-08 ENCOUNTER — Encounter
Admission: RE | Disposition: A | Discharge status: Home / Self Care | Payer: Self-pay | Source: Home / Self Care | Attending: Gastroenterology

## 2023-10-08 ENCOUNTER — Encounter: Payer: Self-pay | Admitting: Gastroenterology

## 2023-10-08 ENCOUNTER — Ambulatory Visit
Admission: RE | Admit: 2023-10-08 | Discharge: 2023-10-08 | Disposition: A | Discharge status: Home / Self Care | Payer: PPO | Attending: Gastroenterology | Admitting: Gastroenterology

## 2023-10-08 DIAGNOSIS — K7460 Unspecified cirrhosis of liver: Secondary | ICD-10-CM | POA: Diagnosis not present

## 2023-10-08 DIAGNOSIS — K222 Esophageal obstruction: Secondary | ICD-10-CM | POA: Insufficient documentation

## 2023-10-08 DIAGNOSIS — I10 Essential (primary) hypertension: Secondary | ICD-10-CM | POA: Insufficient documentation

## 2023-10-08 DIAGNOSIS — K7030 Alcoholic cirrhosis of liver without ascites: Secondary | ICD-10-CM

## 2023-10-08 DIAGNOSIS — Z13810 Encounter for screening for upper gastrointestinal disorder: Secondary | ICD-10-CM | POA: Diagnosis not present

## 2023-10-08 DIAGNOSIS — F17210 Nicotine dependence, cigarettes, uncomplicated: Secondary | ICD-10-CM | POA: Diagnosis not present

## 2023-10-08 DIAGNOSIS — J449 Chronic obstructive pulmonary disease, unspecified: Secondary | ICD-10-CM | POA: Diagnosis not present

## 2023-10-08 HISTORY — PX: ESOPHAGOGASTRODUODENOSCOPY (EGD) WITH PROPOFOL: SHX5813

## 2023-10-08 SURGERY — ESOPHAGOGASTRODUODENOSCOPY (EGD) WITH PROPOFOL
Anesthesia: General

## 2023-10-08 MED ORDER — LIDOCAINE HCL (CARDIAC) PF 100 MG/5ML IV SOSY
PREFILLED_SYRINGE | INTRAVENOUS | Status: DC | PRN
  Administered 2023-10-08: 80 mg via INTRAVENOUS

## 2023-10-08 MED ORDER — SODIUM CHLORIDE 0.9 % IV SOLN: INTRAVENOUS | Status: DC

## 2023-10-08 MED ORDER — PROPOFOL 10 MG/ML IV BOLUS
INTRAVENOUS | Status: DC | PRN
  Administered 2023-10-08: 40 mg via INTRAVENOUS
  Administered 2023-10-08: 30 mg via INTRAVENOUS

## 2023-10-08 NOTE — H&P (Signed)
[name], MD [address], [address], [place], [place], [address] [address], [address], [place], [place], [address] Phone: [phone]  Fax: [phone]  Primary Care Physician:  Allegra Grana, FNP   Pre-Procedure History & Physical: HPI:  Heather Rodgers is a 77 y.o. female is here for an endoscopy    Past Medical History:  Diagnosis Date   Alcohol abuse 2017   rehab 11/2015   Anemia    Arthritis    Colitis 2017   c.difficile   Hypertension    Kidney stones     Past Surgical History:  Procedure Laterality Date   NO PAST SURGERIES      Prior to Admission medications   Medication Sig Start Date End Date Taking? Authorizing Provider  ALPRAZolam Prudy Feeler) 0.5 MG tablet Take 0.5 mg by mouth daily. 06/24/23   [provider]  Cholecalciferol 125 MCG (5000 UT) TABS Take 1 tablet by mouth daily.    [provider]  Glucosamine-MSM-Hyaluronic Acd (JOINT HEALTH PO) Take 1 tablet by mouth.    [provider]  hydrochlorothiazide (HYDRODIURIL) 25 MG tablet Take 1 tablet (25 mg total) by mouth daily. 07/01/23   Allegra Grana, FNP  nicotine (NICODERM CQ - DOSED IN MG/24 HOURS) 14 mg/24hr patch Place 1 patch (14 mg total) onto the skin daily. 02/13/23   Allegra Grana, FNP  pravastatin (PRAVACHOL) 20 MG tablet TAKE 1 TABLET BY MOUTH DAILY 09/18/22   Allegra Grana, FNP  pravastatin (PRAVACHOL) 40 MG tablet TAKE 1 TABLET BY MOUTH DAILY 08/22/23   Allegra Grana, FNP  telmisartan (MICARDIS) 80 MG tablet Take 1 tablet (80 mg total) by mouth daily. 11/27/22   Allegra Grana, FNP    Allergies as of 09/02/2023 - Review Complete 09/02/2023  Allergen Reaction Noted   Penicillins Hives 09/19/2015    Family History  Problem Relation Age of Onset   Heart disease Mother    Hypertension Mother    Arthritis Father    Heart disease Father    Hypertension Father    Kidney disease Neg Hx     Social History   Socioeconomic  History   Marital status: Widowed    Spouse name: Not on file   Number of children: Not on file   Years of education: Not on file   Highest education level: Not on file  Occupational History   Not on file  Tobacco Use   Smoking status: Some Days    Current packs/day: 0.00    Average packs/day: 0.5 packs/day for 47.0 years (23.5 ttl pk-yrs)    Types: Cigarettes    Start date: 09/19/1995    Last attempt to quit: 12/01/2015    Years since quitting: 7.8   Smokeless tobacco: Never  Vaping Use   Vaping status: Never Used  Substance and Sexual Activity   Alcohol use: No    Alcohol/week: 0.0 standard drinks of alcohol    Comment: sober since late December 2016   Drug use: No   Sexual activity: Never  Other Topics Concern   Not on file  Social History Narrative   Widower   Employed on golf course, 3 days week   Some College   Caffeine- 1 cup of coffee daily and no soda or tea   3 children   Social Determinants of Health   Financial Resource Strain: Low Risk  (09/02/2023)   Overall Financial Resource Strain (CARDIA)    Difficulty of  Paying Living Expenses: Not hard at all  Food Insecurity: No Food Insecurity (09/02/2023)   Hunger Vital Sign    Worried About Running Out of Food in the Last Year: Never true    Ran Out of Food in the Last Year: Never true  Transportation Needs: No Transportation Needs (09/02/2023)   PRAPARE - Administrator, Civil Service (Medical): No    Lack of Transportation (Non-Medical): No  Physical Activity: Insufficiently Active (09/02/2023)   Exercise Vital Sign    Days of Exercise per Week: 3 days    Minutes of Exercise per Session: 40 min  Stress: No Stress Concern Present (09/02/2023)   [employer] of Occupational Health - Occupational Stress Questionnaire    Feeling of Stress : Not at all  Social Connections: Moderately Isolated (09/02/2023)   Social Connection and Isolation Panel [NHANES]    Frequency of Communication with Friends and  Family: More than three times a week    Frequency of Social Gatherings with Friends and Family: Three times a week    Attends Religious Services: More than 4 times per year    Active Member of Clubs or Organizations: No    Attends Banker Meetings: Never    Marital Status: Widowed  Intimate Partner Violence: Not At Risk (09/02/2023)   Humiliation, Afraid, Rape, and Kick questionnaire    Fear of Current or Ex-Partner: No    Emotionally Abused: No    Physically Abused: No    Sexually Abused: No    Review of Systems: See HPI, otherwise negative ROS  Physical Exam: There were no vitals taken for this visit. General:   Alert,  pleasant and cooperative in NAD Head:  Normocephalic and atraumatic. Neck:  Supple; no masses or thyromegaly. Lungs:  Clear throughout to auscultation, normal respiratory effort.    Heart:  +S1, +S2, Regular rate and rhythm, No edema. Abdomen:  Soft, nontender and nondistended. Normal bowel sounds, without guarding, and without rebound.   Neurologic:  Alert and  oriented x4;  grossly normal neurologically.  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  Questions have been answered.  All parties agreeable.   [name], MD  10/08/2023, 8:28 AM

## 2023-10-08 NOTE — Transfer of Care (Signed)
Immediate Anesthesia Transfer of Care Note  Patient: Heather Rodgers  Procedure(s) Performed: ESOPHAGOGASTRODUODENOSCOPY (EGD) WITH PROPOFOL  Patient Location: PACU  Anesthesia Type:General  Level of Consciousness: awake and alert   Airway & Oxygen Therapy: Patient Spontanous Breathing  Post-op Assessment: Report given to RN and Post -op Vital signs reviewed and stable  Post vital signs: Reviewed  Last Vitals: See PACU flow sheet for V/S  Vitals Value Taken Time  BP    Temp    Pulse    Resp    SpO2      Last Pain:  Vitals:   10/08/23 0840  TempSrc: Temporal  PainSc: 0-No pain         Complications: No notable events documented.

## 2023-10-08 NOTE — Anesthesia Preprocedure Evaluation (Addendum)
Anesthesia Evaluation  Patient identified by MRN, date of birth, ID band Patient awake    Reviewed: Allergy & Precautions, NPO status , Patient's Chart, lab work & pertinent test results  History of Anesthesia Complications Negative for: history of anesthetic complications  Airway Mallampati: III  TM Distance: >3 FB Neck ROM: full    Dental  (+) Upper Dentures   Pulmonary COPD, Current Smoker and Patient abstained from smoking.   Pulmonary exam normal        Cardiovascular hypertension, On Medications Normal cardiovascular exam     Neuro/Psych negative neurological ROS  negative psych ROS   GI/Hepatic negative GI ROS, Neg liver ROS,,,  Endo/Other  negative endocrine ROS    Renal/GU negative Renal ROS  negative genitourinary   Musculoskeletal  (+) Arthritis ,    Abdominal   Peds  Hematology negative hematology ROS (+) Blood dyscrasia, anemia   Anesthesia Other Findings Past Medical History: 2017: Alcohol abuse     Comment:  rehab 11/2015 No date: Anemia No date: Arthritis 2017: Colitis     Comment:  c.difficile No date: Hypertension No date: Kidney stones  Past Surgical History: No date: NO PAST SURGERIES     Reproductive/Obstetrics negative OB ROS                             Anesthesia Physical Anesthesia Plan  ASA: 3  Anesthesia Plan: General   Post-op Pain Management: Minimal or no pain anticipated   Induction: Intravenous  PONV Risk Score and Plan: 2 and Propofol infusion and TIVA  Airway Management Planned: Natural Airway and Nasal Cannula  Additional Equipment:   Intra-op Plan:   Post-operative Plan:   Informed Consent: I have reviewed the patients History and Physical, chart, labs and discussed the procedure including the risks, benefits and alternatives for the proposed anesthesia with the patient or authorized representative who has indicated his/her  understanding and acceptance.     Dental Advisory Given  Plan Discussed with: Anesthesiologist, CRNA and Surgeon  Anesthesia Plan Comments: (Patient consented for risks of anesthesia including but not limited to:  - adverse reactions to medications - risk of airway placement if required - damage to eyes, teeth, lips or other oral mucosa - nerve damage due to positioning  - sore throat or hoarseness - Damage to heart, brain, nerves, lungs, other parts of body or loss of life  Patient voiced understanding and assent.)       Anesthesia Quick Evaluation

## 2023-10-08 NOTE — OR Nursing (Signed)
PT EXPERIENCING ABNORMAL EKG RHYTHM. DR Joelene Millin NOTIFIED. EKG DONE. 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 HX SHOWS SAME RHYTHM. CLEARANCE TO PROCEED. BY ANESTHESIA

## 2023-10-08 NOTE — Op Note (Signed)
[facility] Gastroenterology Patient Name: Heather Rodgers Procedure Date: 10/08/2023 9:00 AM MRN: [id number] Account #: [id number] Date of Birth: Jul 19, 1946 Admit Type: Outpatient Age: 77 Room: [facility] ENDO ROOM 2 Gender: Female Note Status: Finalized Instrument Name: Upper Endoscope [id number] Procedure:             Upper GI endoscopy Indications:           Cirrhosis rule out esophageal varices Providers:             [name] MD, MD Referring MD:          Lyn Records. Arnett (Referring MD) Medicines:             Monitored Anesthesia Care Complications:         No immediate complications. Procedure:             Pre-Anesthesia Assessment:                        - Prior to the procedure, a History and Physical was                         performed, and patient medications, allergies and                         sensitivities were reviewed. The patient's tolerance                         of previous anesthesia was reviewed.                        - The risks and benefits of the procedure and the                         sedation options and risks were discussed with the                         patient. All questions were answered and informed                         consent was obtained.                        - ASA Grade Assessment: II - A patient with mild                         systemic disease.                        After obtaining informed consent, the endoscope was                         passed under direct vision. Throughout the procedure,                         the patient's blood pressure, pulse, and oxygen                         saturations were monitored continuously. The Endoscope  was introduced through the mouth, and advanced to the                         third part of duodenum. The upper GI endoscopy was                         accomplished with ease. The patient tolerated the                         procedure well. Findings:       The stomach was normal.      The examined duodenum was normal.      The cardia and gastric fundus were normal on retroflexion.      One benign-appearing, intrinsic mild stenosis was found at the       gastroesophageal junction. The stenosis was traversed. Impression:            - Normal stomach.                        - Normal examined duodenum.                        - Benign-appearing esophageal stenosis.                        - No specimens collected. Recommendation:        - Discharge patient to home (with escort).                        - Resume previous diet.                        - Continue present medications.                        - Repeat upper endoscopy in 3 years for surveillance.                        - Return to GI office as previously scheduled. Procedure Code(s):     --- Professional ---                        [phone], Esophagogastroduodenoscopy, flexible,                         transoral; diagnostic, including collection of                         specimen(s) by brushing or washing, when performed                         (separate procedure) Diagnosis Code(s):     --- Professional ---                        K22.2, Esophageal obstruction                        K74.60, Unspecified cirrhosis of liver CPT copyright 2022 American Medical Association. All rights reserved. The codes documented in this report are preliminary and upon coder review may  be revised to meet current  compliance requirements. [name], MD [name] MD, MD 10/08/2023 9:30:13 AM This report has been signed electronically. Number of Addenda: 0 Note Initiated On: 10/08/2023 9:00 AM Estimated Blood Loss:  Estimated blood loss: none.      [facility]

## 2023-10-08 NOTE — Anesthesia Postprocedure Evaluation (Signed)
Anesthesia Post Note  Patient: Jamilet Darley  Procedure(s) Performed: ESOPHAGOGASTRODUODENOSCOPY (EGD) WITH PROPOFOL  Patient location during evaluation: Endoscopy Anesthesia Type: General Level of consciousness: awake and alert Pain management: pain level controlled Vital Signs Assessment: post-procedure vital signs reviewed and stable Respiratory status: spontaneous breathing, nonlabored ventilation, respiratory function stable and patient connected to nasal cannula oxygen Cardiovascular status: blood pressure returned to baseline and stable Postop Assessment: no apparent nausea or vomiting Anesthetic complications: no   No notable events documented.   Last Vitals:  Vitals:   10/08/23 0931 10/08/23 0941  BP: 135/77 (!) 138/54  Pulse: 61 (!) 53  Resp: 15 13  Temp: (!) 35.8 C   SpO2: 97% 99%    Last Pain:  Vitals:   10/08/23 0941  TempSrc:   PainSc: 0-No pain                 Louie Boston

## 2023-10-09 ENCOUNTER — Encounter: Payer: Self-pay | Admitting: Gastroenterology

## 2023-10-23 ENCOUNTER — Telehealth: Payer: Self-pay

## 2023-10-23 NOTE — Telephone Encounter (Signed)
-----   Message from [name] sent at 10/22/2023 12:34 PM EST ----- Needs hep B vaccine

## 2023-10-23 NOTE — Telephone Encounter (Signed)
Called patient but unable to leave a message because voicemail is full.

## 2023-11-01 NOTE — Telephone Encounter (Signed)
Patient is in our recall list to follow up with Inetta Fermo. Tried calling her again today and couldn't leave her a voicemail.

## 2023-11-21 ENCOUNTER — Encounter: Payer: Self-pay | Admitting: *Deleted

## 2023-11-25 ENCOUNTER — Other Ambulatory Visit: Payer: Self-pay | Admitting: Family

## 2023-11-25 DIAGNOSIS — I159 Secondary hypertension, unspecified: Secondary | ICD-10-CM

## 2023-12-23 ENCOUNTER — Ambulatory Visit: Payer: PPO | Admitting: Family

## 2023-12-23 ENCOUNTER — Encounter: Payer: Self-pay | Admitting: Family

## 2023-12-23 VITALS — BP 122/80 | HR 65 | Temp 97.6°F | Ht 62.0 in | Wt 166.4 lb

## 2023-12-23 DIAGNOSIS — Z1231 Encounter for screening mammogram for malignant neoplasm of breast: Secondary | ICD-10-CM | POA: Diagnosis not present

## 2023-12-23 DIAGNOSIS — I159 Secondary hypertension, unspecified: Secondary | ICD-10-CM

## 2023-12-23 DIAGNOSIS — E785 Hyperlipidemia, unspecified: Secondary | ICD-10-CM | POA: Diagnosis not present

## 2023-12-23 DIAGNOSIS — K7030 Alcoholic cirrhosis of liver without ascites: Secondary | ICD-10-CM

## 2023-12-23 LAB — CBC WITH DIFFERENTIAL/PLATELET
Basophils Absolute: 0.1 10*3/uL (ref 0.0–0.1)
Basophils Relative: 0.8 % (ref 0.0–3.0)
Eosinophils Absolute: 0.1 10*3/uL (ref 0.0–0.7)
Eosinophils Relative: 1 % (ref 0.0–5.0)
HCT: 38.3 % (ref 36.0–46.0)
Hemoglobin: 12.9 g/dL (ref 12.0–15.0)
Lymphocytes Relative: 20.9 % (ref 12.0–46.0)
Lymphs Abs: 1.5 10*3/uL (ref 0.7–4.0)
MCHC: 33.6 g/dL (ref 30.0–36.0)
MCV: 95.3 fL (ref 78.0–100.0)
Monocytes Absolute: 0.5 10*3/uL (ref 0.1–1.0)
Monocytes Relative: 6.4 % (ref 3.0–12.0)
Neutro Abs: 5.1 10*3/uL (ref 1.4–7.7)
Neutrophils Relative %: 70.9 % (ref 43.0–77.0)
Platelets: 236 10*3/uL (ref 150.0–400.0)
RBC: 4.02 Mil/uL (ref 3.87–5.11)
RDW: 13.4 % (ref 11.5–15.5)
WBC: 7.2 10*3/uL (ref 4.0–10.5)

## 2023-12-23 LAB — COMPREHENSIVE METABOLIC PANEL
ALT: 12 U/L (ref 0–35)
AST: 14 U/L (ref 0–37)
Albumin: 4.5 g/dL (ref 3.5–5.2)
Alkaline Phosphatase: 43 U/L (ref 39–117)
BUN: 18 mg/dL (ref 6–23)
CO2: 29 meq/L (ref 19–32)
Calcium: 10.2 mg/dL (ref 8.4–10.5)
Chloride: 101 meq/L (ref 96–112)
Creatinine, Ser: 0.92 mg/dL (ref 0.40–1.20)
GFR: 60.79 mL/min (ref >60.00)
Glucose, Bld: 92 mg/dL (ref 70–99)
Potassium: 4.4 meq/L (ref 3.5–5.1)
Sodium: 139 meq/L (ref 135–145)
Total Bilirubin: 0.8 mg/dL (ref 0.2–1.2)
Total Protein: 7.4 g/dL (ref 6.0–8.3)

## 2023-12-23 LAB — LIPID PANEL
Cholesterol: 163 mg/dL (ref 0–200)
HDL: 54.6 mg/dL (ref >39.00)
LDL Cholesterol: 92 mg/dL (ref 0–99)
NonHDL: 108.64
Total CHOL/HDL Ratio: 3
Triglycerides: 82 mg/dL (ref 0.0–149.0)
VLDL: 16.4 mg/dL (ref 0.0–40.0)

## 2023-12-23 LAB — VITAMIN D 25 HYDROXY (VIT D DEFICIENCY, FRACTURES): VITD: 54.64 ng/mL (ref 30.00–100.00)

## 2023-12-23 LAB — HEMOGLOBIN A1C: Hgb A1c MFr Bld: 6.4 % (ref 4.6–6.5)

## 2023-12-23 LAB — TSH: TSH: 3.07 u[IU]/mL (ref 0.35–5.50)

## 2023-12-23 NOTE — Patient Instructions (Addendum)
Please call [facility] to schedule follow up in April 2025.   You are due for annual lung cancer screening program.    Previously the program had been managed under [facility] hematology/oncology, now it has been moved to [facility] pulmonology .    I have placed a referral to [facility] pulmonology  and their office will reach out to you to schedule your CT of your chest.  They will reach out to you annually going forward.  If you do not hear from their office in the next 1 to 2 weeks, please call [facility]monology at [phone]   So let me know if there are any issues in getting scheduled.     Very nice to see you!

## 2023-12-23 NOTE — Progress Notes (Signed)
Assessment & Plan:  Secondary hypertension Assessment & Plan: Chronic, stable.  Continue telmisartan 80 mg, hydrochlorothiazide 25 mg  Orders: -     TSH  Hyperlipidemia, unspecified hyperlipidemia type -     VITAMIN D 25 Hydroxy (Vit-D Deficiency, Fractures) -     Hemoglobin A1c -     CBC with Differential/Platelet -     Comprehensive metabolic panel -     Lipid panel -     TSH -     Microalbumin / creatinine urine ratio; Future  Encounter for screening mammogram for malignant neoplasm of breast -     3D Screening Mammogram, Left and Right; Future  Alcoholic cirrhosis of liver without ascites [facility]) Assessment & Plan: Reviewed follow-up with Dr. Tobi Bastos from October.  Reiterated the importance of continued follow-up for active surveillance of cirrhosis.  Orders: -     CBC with Differential/Platelet -     Comprehensive metabolic panel     Return precautions given.   Risks, benefits, and alternatives of the medications and treatment plan prescribed today were discussed, and patient expressed understanding.   Education regarding symptom management and diagnosis given to patient on AVS either electronically or printed.  Return in about 6 months (around 06/21/2024).  [name], FNP  Subjective:    Patient ID: Heather Rodgers, female    DOB: 1946-02-06, 79 y.o.   MRN: [id number]  CC: Heather Rodgers is a 78 y.o. female who presents today for follow up.   HPI: Feels well today.  No new complaints  She remains compliant with telmisartan 80 mg daily , hydrochlorothiazide 25 mg daily.  Denies chest pain, shortness of breath. Follow-up Dr. Tobi Bastos 09/02/2023.  Due right upper quadrant USG and AFP to 12/2023 EGD 09/2023 Smoker CT lung cancer screen 01/29/2017 Allergies: Penicillins Current Outpatient Medications on File Prior to Visit  Medication Sig Dispense Refill   Cholecalciferol 125 MCG (5000 UT) TABS Take 1 tablet by mouth daily.      Glucosamine-MSM-Hyaluronic Acd (JOINT HEALTH PO) Take 1 tablet by mouth.     hydrochlorothiazide (HYDRODIURIL) 25 MG tablet Take 1 tablet (25 mg total) by mouth daily. 90 tablet 3   nicotine (NICODERM CQ - DOSED IN MG/24 HOURS) 14 mg/24hr patch Place 1 patch (14 mg total) onto the skin daily. 28 patch 1   pravastatin (PRAVACHOL) 20 MG tablet TAKE 1 TABLET BY MOUTH DAILY 90 tablet 3   pravastatin (PRAVACHOL) 40 MG tablet TAKE 1 TABLET BY MOUTH DAILY 90 tablet 1   telmisartan (MICARDIS) 80 MG tablet TAKE 1 TABLET BY MOUTH DAILY 90 tablet 3   No current facility-administered medications on file prior to visit.    Review of Systems  Constitutional:  Negative for chills and fever.  Respiratory:  Negative for cough.   Cardiovascular:  Negative for chest pain and palpitations.  Gastrointestinal:  Negative for nausea and vomiting.      Objective:    BP 122/80   Pulse 65   Temp 97.6 F (36.4 C) (Oral)   Ht 5\' 2"  (1.575 m)   Wt 166 lb 6.4 oz (75.5 kg)   SpO2 99%   BMI 30.43 kg/m  BP Readings from Last 3 Encounters:  12/23/23 122/80  10/08/23 (!) 138/54  09/02/23 (!) 165/78   Wt Readings from Last 3 Encounters:  12/23/23 166 lb 6.4 oz (75.5 kg)  10/08/23 159 lb (72.1 kg)  09/02/23 161 lb 9.6 oz (73.3 kg)    Physical Exam  Vitals reviewed.  Constitutional:      Appearance: She is well-developed.  Eyes:     Conjunctiva/sclera: Conjunctivae normal.  Cardiovascular:     Rate and Rhythm: Normal rate and regular rhythm.     Pulses: Normal pulses.     Heart sounds: Normal heart sounds.  Pulmonary:     Effort: Pulmonary effort is normal.     Breath sounds: Normal breath sounds. No wheezing, rhonchi or rales.  Skin:    General: Skin is warm and dry.  Neurological:     Mental Status: She is alert.  Psychiatric:        Speech: Speech normal.        Behavior: Behavior normal.        Thought Content: Thought content normal.

## 2023-12-24 ENCOUNTER — Encounter: Payer: Self-pay | Admitting: Family

## 2023-12-24 LAB — MICROALBUMIN / CREATININE URINE RATIO
Creatinine,U: 83.4 mg/dL
Microalb Creat Ratio: 1 mg/g (ref 0.0–30.0)
Microalb, Ur: 0.8 mg/dL (ref 0.0–1.9)

## 2023-12-27 NOTE — Assessment & Plan Note (Addendum)
Chronic, stable.  Continue telmisartan 80 mg, hydrochlorothiazide 25 mg

## 2023-12-27 NOTE — Assessment & Plan Note (Signed)
Reviewed follow-up with Dr. Tobi Bastos from October.  Reiterated the importance of continued follow-up for active surveillance of cirrhosis.

## 2023-12-30 ENCOUNTER — Ambulatory Visit
Admission: RE | Admit: 2023-12-30 | Discharge: 2023-12-30 | Disposition: A | Discharge status: Home / Self Care | Payer: PPO | Source: Ambulatory Visit | Attending: Gastroenterology | Admitting: Gastroenterology

## 2023-12-30 DIAGNOSIS — K7030 Alcoholic cirrhosis of liver without ascites: Secondary | ICD-10-CM | POA: Insufficient documentation

## 2023-12-30 DIAGNOSIS — K7689 Other specified diseases of liver: Secondary | ICD-10-CM | POA: Diagnosis not present

## 2023-12-30 DIAGNOSIS — N281 Cyst of kidney, acquired: Secondary | ICD-10-CM | POA: Diagnosis not present

## 2023-12-30 DIAGNOSIS — R932 Abnormal findings on diagnostic imaging of liver and biliary tract: Secondary | ICD-10-CM | POA: Diagnosis not present

## 2024-01-02 ENCOUNTER — Encounter: Payer: Self-pay | Admitting: Gastroenterology

## 2024-02-02 ENCOUNTER — Encounter: Payer: Self-pay | Admitting: Family

## 2024-02-02 DIAGNOSIS — R609 Edema, unspecified: Secondary | ICD-10-CM

## 2024-02-05 NOTE — Telephone Encounter (Signed)
Referral placed- pt is aware

## 2024-02-11 ENCOUNTER — Telehealth: Payer: Self-pay | Admitting: Family

## 2024-02-11 NOTE — Telephone Encounter (Signed)
 Spoke to pt and gave her the info to [facility] [place]

## 2024-02-11 NOTE — Telephone Encounter (Signed)
 Copied from CRM [id number]. Topic: Referral - Status >> Feb 11, 2024  9:52 AM Heather Rodgers wrote: Reason for CRM: Pt calling to check the status of the referral and request the contact information for the Podiatrist she was referred to.

## 2024-02-18 ENCOUNTER — Ambulatory Visit: Admitting: Podiatry

## 2024-02-18 DIAGNOSIS — M76822 Posterior tibial tendinitis, left leg: Secondary | ICD-10-CM

## 2024-02-18 DIAGNOSIS — Q666 Other congenital valgus deformities of feet: Secondary | ICD-10-CM

## 2024-02-18 NOTE — Progress Notes (Signed)
 Subjective:  Patient ID: Heather Rodgers, female    DOB: [birth year],  MRN: [id number]  Chief Complaint  Patient presents with   Foot Pain    Pt stated that she just wants to get some relief for her left foot she stated that it will swell and at times she does have some pain and discomfort    78 y.o. female presents with the above complaint.  Patient presents with left medial foot pain that has been on for quite some time is progressive gotten worse worse with ambulation worse with pressure.  It swells at times she does have a lot of pain discomfort when it does swell up.  She wanted discuss treatment options for it.  She is flat-footed she does not wear any orthotics.  She would like to discuss treatment options.  Pain scale 7 out of 10 dull aching nature   Review of Systems: Negative except as noted in the HPI. Denies N/V/F/Ch.  Past Medical History:  Diagnosis Date   Alcohol abuse 2017   rehab 11/2015   Anemia    Arthritis    Colitis 2017   c.difficile   Hypertension    Kidney stones     Current Outpatient Medications:    Cholecalciferol 125 MCG (5000 UT) TABS, Take 1 tablet by mouth daily., Disp: , Rfl:    Glucosamine-MSM-Hyaluronic Acd (JOINT HEALTH PO), Take 1 tablet by mouth., Disp: , Rfl:    hydrochlorothiazide (HYDRODIURIL) 25 MG tablet, Take 1 tablet (25 mg total) by mouth daily., Disp: 90 tablet, Rfl: 3   nicotine (NICODERM CQ - DOSED IN MG/24 HOURS) 14 mg/24hr patch, Place 1 patch (14 mg total) onto the skin daily., Disp: 28 patch, Rfl: 1   pravastatin (PRAVACHOL) 20 MG tablet, TAKE 1 TABLET BY MOUTH DAILY, Disp: 90 tablet, Rfl: 3   pravastatin (PRAVACHOL) 40 MG tablet, TAKE 1 TABLET BY MOUTH DAILY, Disp: 90 tablet, Rfl: 1   telmisartan (MICARDIS) 80 MG tablet, TAKE 1 TABLET BY MOUTH DAILY, Disp: 90 tablet, Rfl: 3  Social History   Tobacco Use  Smoking Status Some Days   Current packs/day: 0.00   Average packs/day: 0.5 packs/day for 47.0 years (23.5 ttl  pk-yrs)   Types: Cigarettes   Start date: 09/19/1995   Last attempt to quit: 12/01/2015   Years since quitting: 8.2  Smokeless Tobacco Never    Allergies  Allergen Reactions   Penicillins Hives   Objective:  There were no vitals filed for this visit. There is no height or weight on file to calculate BMI. Constitutional Well developed. Well nourished.  Vascular Dorsalis pedis pulses palpable bilaterally. Posterior tibial pulses palpable bilaterally. Capillary refill normal to all digits.  No cyanosis or clubbing noted. Pedal hair growth normal.  Neurologic Normal speech. Oriented to person, place, and time. Epicritic sensation to light touch grossly present bilaterally.  Dermatologic Nails well groomed and normal in appearance. No open wounds. No skin lesions.  Orthopedic: Pain along the course of the posterior tibial tendon pain with resisted plantarflexion inversion of the foot no pain with dorsiflexion eversion of the foot.  Gait examination shows pes planovalgus foot structure with calcaneovalgus to many toe signs unable to recreate the arch with dorsiflexion of the hallux unable to perform single and double heel raise   Radiographs: None Assessment:   1. Posterior tibial tendinitis of left leg   2. Pes planovalgus    Plan:  Patient was evaluated and treated and all questions answered.  Left  posterior tibial tendinitis -All questions and concerns were discussed with the patient in extensive detail given the amount of pain that she is having she will benefit from cam boot immobilization Cam boot was dispensed I discussed this with the patient in extensive detail and encouraged her to wear it at all times when she is on her feet.  She states understand will do so  Pes planovalgus -I explained to patient the etiology of pes planovalgus and relationship with posterior tibial tendinitis and various treatment options were discussed.  Given patient foot structure in the setting  of posterior tibial tendinitis I believe patient will benefit from custom-made orthotics to help control the hindfoot motion support the arch of the foot and take the stress away from plantar fascial.  Patient agrees with the plan like to proceed with orthotics -Patient was casted for orthotics   No follow-ups on file.

## 2024-02-25 NOTE — Progress Notes (Signed)
  Orthotic order placed will schedule for fitting when in

## 2024-03-03 ENCOUNTER — Other Ambulatory Visit: Payer: Self-pay | Admitting: Family

## 2024-03-03 DIAGNOSIS — E785 Hyperlipidemia, unspecified: Secondary | ICD-10-CM

## 2024-03-05 ENCOUNTER — Telehealth: Payer: Self-pay

## 2024-03-05 NOTE — Telephone Encounter (Signed)
 Sending orthotics to [employer]

## 2024-03-17 ENCOUNTER — Ambulatory Visit: Admitting: Podiatry

## 2024-03-17 DIAGNOSIS — Q666 Other congenital valgus deformities of feet: Secondary | ICD-10-CM | POA: Diagnosis not present

## 2024-03-17 DIAGNOSIS — M76822 Posterior tibial tendinitis, left leg: Secondary | ICD-10-CM

## 2024-03-17 NOTE — Progress Notes (Signed)
 Subjective:  Patient ID: Heather Rodgers, female    DOB: 11-08-1946,  MRN: [id number]  Chief Complaint  Patient presents with   Foot Pain    Pt stated that she is doing better     78 y.o. female presents with the above complaint.  Patient presents with complaint of left posterior tibial tendinitis.  She states that she is doing much better.  She is about 90% resolved she still has little bit of residual.  She would like to avoid steroid shot she is here to pick up her orthotics   Review of Systems: Negative except as noted in the HPI. Denies N/V/F/Ch.  Past Medical History:  Diagnosis Date   Alcohol abuse 2017   rehab 11/2015   Anemia    Arthritis    Colitis 2017   c.difficile   Hypertension    Kidney stones     Current Outpatient Medications:    Cholecalciferol  125 MCG (5000 UT) TABS, Take 1 tablet by mouth daily., Disp: , Rfl:    Glucosamine-MSM-Hyaluronic Acd (JOINT HEALTH PO), Take 1 tablet by mouth., Disp: , Rfl:    hydrochlorothiazide  (HYDRODIURIL ) 25 MG tablet, Take 1 tablet (25 mg total) by mouth daily., Disp: 90 tablet, Rfl: 3   nicotine  (NICODERM CQ  - DOSED IN MG/24 HOURS) 14 mg/24hr patch, Place 1 patch (14 mg total) onto the skin daily., Disp: 28 patch, Rfl: 1   pravastatin  (PRAVACHOL ) 20 MG tablet, TAKE 1 TABLET BY MOUTH DAILY, Disp: 90 tablet, Rfl: 3   pravastatin  (PRAVACHOL ) 40 MG tablet, TAKE 1 TABLET BY MOUTH DAILY, Disp: 90 tablet, Rfl: 1   telmisartan  (MICARDIS ) 80 MG tablet, TAKE 1 TABLET BY MOUTH DAILY, Disp: 90 tablet, Rfl: 3  Social History   Tobacco Use  Smoking Status Some Days   Current packs/day: 0.00   Average packs/day: 0.5 packs/day for 47.0 years (23.5 ttl pk-yrs)   Types: Cigarettes   Start date: 09/19/1995   Last attempt to quit: 12/01/2015   Years since quitting: 8.2  Smokeless Tobacco Never    Allergies  Allergen Reactions   Penicillins Hives   Objective:  There were no vitals filed for this visit. There is no height or  weight on file to calculate BMI. Constitutional Well developed. Well nourished.  Vascular Dorsalis pedis pulses palpable bilaterally. Posterior tibial pulses palpable bilaterally. Capillary refill normal to all digits.  No cyanosis or clubbing noted. Pedal hair growth normal.  Neurologic Normal speech. Oriented to person, place, and time. Epicritic sensation to light touch grossly present bilaterally.  Dermatologic Nails well groomed and normal in appearance. No open wounds. No skin lesions.  Orthopedic: Mild pain along the course of the posterior tibial tendon pain with resisted plantarflexion inversion of the foot no pain with dorsiflexion eversion of the foot.  Gait examination shows pes planovalgus foot structure with calcaneovalgus to many toe signs unable to recreate the arch with dorsiflexion of the hallux unable to perform single and double heel raise   Radiographs: None Assessment:   1. Posterior tibial tendinitis of left leg   2. Pes planovalgus     Plan:  Patient was evaluated and treated and all questions answered.  Left posterior tibial tendinitis -All questions and concerns were discussed with the patient in extensive detail her pain has mostly resolved with cam boot immobilization at this time I encouraged her to do Tri-Lock ankle bracing to help with transition out of the boot she states understanding Tri-Lock ankle brace was dispensed.  If any foot and ankle issues arise in the future she will come back and see me.  She has made shoe gear modification and has gotten orthotics  Pes planovalgus -I explained to patient the etiology of pes planovalgus and relationship with posterior tibial tendinitis and various treatment options were discussed.  Given patient foot structure in the setting of posterior tibial tendinitis I believe patient will benefit from custom-made orthotics to help control the hindfoot motion support the arch of the foot and take the stress away from  plantar fascial.  Patient agrees with the plan like to proceed with orthotics - Orthotics were dispensed they are functioning well no acute complaints.   No follow-ups on file.

## 2024-03-31 DIAGNOSIS — H524 Presbyopia: Secondary | ICD-10-CM | POA: Diagnosis not present

## 2024-03-31 DIAGNOSIS — H35353 Cystoid macular degeneration, bilateral: Secondary | ICD-10-CM | POA: Diagnosis not present

## 2024-03-31 DIAGNOSIS — H353131 Nonexudative age-related macular degeneration, bilateral, early dry stage: Secondary | ICD-10-CM | POA: Diagnosis not present

## 2024-03-31 DIAGNOSIS — H5203 Hypermetropia, bilateral: Secondary | ICD-10-CM | POA: Diagnosis not present

## 2024-03-31 DIAGNOSIS — Z961 Presence of intraocular lens: Secondary | ICD-10-CM | POA: Diagnosis not present

## 2024-03-31 DIAGNOSIS — H52223 Regular astigmatism, bilateral: Secondary | ICD-10-CM | POA: Diagnosis not present

## 2024-06-23 ENCOUNTER — Other Ambulatory Visit: Payer: Self-pay | Admitting: Family

## 2024-06-23 DIAGNOSIS — I159 Secondary hypertension, unspecified: Secondary | ICD-10-CM

## 2024-08-03 ENCOUNTER — Encounter: Payer: Self-pay | Admitting: Family

## 2024-08-03 ENCOUNTER — Ambulatory Visit (INDEPENDENT_AMBULATORY_CARE_PROVIDER_SITE_OTHER): Admitting: Family

## 2024-08-03 VITALS — BP 122/70 | HR 55 | Temp 97.6°F | Resp 20 | Ht 62.0 in | Wt 152.1 lb

## 2024-08-03 DIAGNOSIS — E785 Hyperlipidemia, unspecified: Secondary | ICD-10-CM

## 2024-08-03 DIAGNOSIS — K7030 Alcoholic cirrhosis of liver without ascites: Secondary | ICD-10-CM | POA: Diagnosis not present

## 2024-08-03 DIAGNOSIS — I159 Secondary hypertension, unspecified: Secondary | ICD-10-CM

## 2024-08-03 LAB — COMPREHENSIVE METABOLIC PANEL WITH GFR
ALT: 12 U/L (ref 0–35)
AST: 14 U/L (ref 0–37)
Albumin: 4.4 g/dL (ref 3.5–5.2)
Alkaline Phosphatase: 38 U/L — ABNORMAL LOW (ref 39–117)
BUN: 22 mg/dL (ref 6–23)
CO2: 29 meq/L (ref 19–32)
Calcium: 10 mg/dL (ref 8.4–10.5)
Chloride: 101 meq/L (ref 96–112)
Creatinine, Ser: 0.95 mg/dL (ref 0.40–1.20)
GFR: 58.24 mL/min — ABNORMAL LOW (ref >60.00)
Glucose, Bld: 87 mg/dL (ref 70–99)
Potassium: 4.5 meq/L (ref 3.5–5.1)
Sodium: 138 meq/L (ref 135–145)
Total Bilirubin: 0.9 mg/dL (ref 0.2–1.2)
Total Protein: 7 g/dL (ref 6.0–8.3)

## 2024-08-03 LAB — CBC WITH DIFFERENTIAL/PLATELET
Basophils Absolute: 0 K/uL (ref 0.0–0.1)
Basophils Relative: 0.7 % (ref 0.0–3.0)
Eosinophils Absolute: 0.1 K/uL (ref 0.0–0.7)
Eosinophils Relative: 2.2 % (ref 0.0–5.0)
HCT: 36.8 % (ref 36.0–46.0)
Hemoglobin: 12.3 g/dL (ref 12.0–15.0)
Lymphocytes Relative: 22.8 % (ref 12.0–46.0)
Lymphs Abs: 1.3 K/uL (ref 0.7–4.0)
MCHC: 33.4 g/dL (ref 30.0–36.0)
MCV: 94.8 fl (ref 78.0–100.0)
Monocytes Absolute: 0.5 K/uL (ref 0.1–1.0)
Monocytes Relative: 9 % (ref 3.0–12.0)
Neutro Abs: 3.7 K/uL (ref 1.4–7.7)
Neutrophils Relative %: 65.3 % (ref 43.0–77.0)
Platelets: 208 K/uL (ref 150.0–400.0)
RBC: 3.88 Mil/uL (ref 3.87–5.11)
RDW: 13 % (ref 11.5–15.5)
WBC: 5.7 K/uL (ref 4.0–10.5)

## 2024-08-03 LAB — HEMOGLOBIN A1C: Hgb A1c MFr Bld: 6.2 % (ref 4.6–6.5)

## 2024-08-03 NOTE — Assessment & Plan Note (Signed)
 Chronic, stable.  Continue telmisartan  80 mg, hydrochlorothiazide  25 mg.  Of note, discussed weight loss and how it can affect blood pressure.  Advised close monitoring of blood pressure and certainly to decrease hydrochlorothiazide  to 12.5 mg or hold altogether if dizziness or to become more persistent.  Patient verbalized understanding.

## 2024-08-03 NOTE — Assessment & Plan Note (Signed)
 Pending labs today.  Reiterated the importance of continued surveillance.  She is overdue for MRI of the liver.  Referral placed to gastroenterology.

## 2024-08-03 NOTE — Patient Instructions (Addendum)
 It is imperative that you are seen AT least twice per year for labs and monitoring. Monitor blood pressure at home and me 5-6 reading on separate days. Goal is less than 120/80, based on newest guidelines, however we certainly want to be less than 130/80;  if persistently higher, please make sooner follow up appointment so we can recheck you blood pressure and manage/ adjust medications.  If needed, break the hydrochlorothiazide  25mg  in HALF ( 12.5mg ) ; continue telmisartan  80mg  daily for now.  Referral back to Dr Therisa; you are overdue for follow-up.  Let us  know if you dont hear back within 2 weeks in regards to an appointment being scheduled.   So that you are aware, if you are [facility] MyChart user , please pay attention to your MyChart messages as you may receive a MyChart message with a phone number to call and schedule this test/appointment own your own from our referral coordinator. This is a new process so I do not want you to miss this message.  If you are not a MyChart user, you will receive a phone call.    Please call  and schedule your 3D mammogram and /or bone density scan as we discussed.   [facility]  ( new location in 2023)  [address], [place], [place] [address]  [place], [place]  [phone]

## 2024-08-03 NOTE — Assessment & Plan Note (Signed)
 Lab Results  Component Value Date   LDLCALC 92 12/23/2023  LDL overall stable.  Plan to repeat cholesterol at follow-up.  Continue pravastatin  60 mg daily.

## 2024-08-03 NOTE — Progress Notes (Signed)
 Assessment & Plan:  Alcoholic cirrhosis of liver without ascites (HCC) Assessment & Plan: Pending labs today.  Reiterated the importance of continued surveillance.  She is overdue for MRI of the liver.  Referral placed to gastroenterology.  Orders: -     Hemoglobin A1c -     CBC with Differential/Platelet -     Protime-INR; Future -     Comprehensive metabolic panel with GFR -     Ambulatory referral to Gastroenterology  Hyperlipidemia, unspecified hyperlipidemia type Assessment & Plan: Lab Results  Component Value Date   LDLCALC 92 12/23/2023  LDL overall stable.  Plan to repeat cholesterol at follow-up.  Continue pravastatin  60 mg daily.    Secondary hypertension Assessment & Plan: Chronic, stable.  Continue telmisartan  80 mg, hydrochlorothiazide  25 mg.  Of note, discussed weight loss and how it can affect blood pressure.  Advised close monitoring of blood pressure and certainly to decrease hydrochlorothiazide  to 12.5 mg or hold altogether if dizziness or to become more persistent.  Patient verbalized understanding.      Return precautions given.   Risks, benefits, and alternatives of the medications and treatment plan prescribed today were discussed, and patient expressed understanding.   Education regarding symptom management and diagnosis given to patient on AVS either electronically or printed.  No follow-ups on file.  Heather Northern, FNP  Subjective:    Patient ID: Heather Rodgers, female    DOB: August 26, 1946, 78 y.o.   MRN: [id number]  CC: Heather Rodgers is a 78 y.o. female who presents today for follow up.   HPI: Feels well today.  No new complaints.  She has not followed up with gastroenterology.  Denies chest pain shortness shortness of breath.  She is intentionally lost weight and feels much better.  Goal weight of 140 pounds.  She is reduce carbohydrates and sugar.  She is using Slimast and Atkins snacks and shakes to supplement her  diet.     She is compliant with telmisartan  80 mg, hydrochlorothiazide  25 mg.  There have been a couple occasions where she has had brief episode of feeling dizzy upon standing.  Denies chest pain, palpitations, syncope.  Right upper quadrant ultrasound 12/30/2023 ordered by Dr. Therisa, gastroenterology.  Recommended MRI of liver 06/2025.    Last seen Dr. Therisa 09/02/2023 for cirrhosis surveillance.  Allergies: Penicillins Current Outpatient Medications on File Prior to Visit  Medication Sig Dispense Refill   Cholecalciferol  125 MCG (5000 UT) TABS Take 1 tablet by mouth daily.     cyanocobalamin  (VITAMIN B12) 500 MCG tablet Take 500 mcg by mouth daily. (Patient taking differently: Take 2,000 mcg by mouth daily.)     Glucosamine-MSM-Hyaluronic Acd (JOINT HEALTH PO) Take 1 tablet by mouth.     hydrochlorothiazide  (HYDRODIURIL ) 25 MG tablet TAKE 1 TABLET BY MOUTH DAILY 90 tablet 3   pravastatin  (PRAVACHOL ) 20 MG tablet TAKE 1 TABLET BY MOUTH DAILY 90 tablet 3   pravastatin  (PRAVACHOL ) 40 MG tablet TAKE 1 TABLET BY MOUTH DAILY 90 tablet 1   telmisartan  (MICARDIS ) 80 MG tablet TAKE 1 TABLET BY MOUTH DAILY 90 tablet 3   nicotine  (NICODERM CQ  - DOSED IN MG/24 HOURS) 14 mg/24hr patch Place 1 patch (14 mg total) onto the skin daily. (Patient not taking: Reported on 08/03/2024) 28 patch 1   No current facility-administered medications on file prior to visit.    Review of Systems  Constitutional:  Negative for chills and fever.  Respiratory:  Negative for cough.  Cardiovascular:  Negative for chest pain and palpitations.  Gastrointestinal:  Negative for nausea and vomiting.      Objective:    BP 122/70   Pulse (!) 55   Temp 97.6 F (36.4 C)   Resp 20   Ht 5' 2 (1.575 m)   Wt 152 lb 2 oz (69 kg)   SpO2 99%   BMI 27.82 kg/m  BP Readings from Last 3 Encounters:  08/03/24 122/70  12/23/23 122/80  10/08/23 (!) 138/54   Wt Readings from Last 3 Encounters:  08/03/24 152 lb 2 oz (69 kg)   12/23/23 166 lb 6.4 oz (75.5 kg)  10/08/23 159 lb (72.1 kg)    Physical Exam Vitals reviewed.  Constitutional:      Appearance: She is well-developed.  Eyes:     Conjunctiva/sclera: Conjunctivae normal.  Cardiovascular:     Rate and Rhythm: Normal rate and regular rhythm.     Pulses: Normal pulses.     Heart sounds: Normal heart sounds.  Pulmonary:     Effort: Pulmonary effort is normal.     Breath sounds: Normal breath sounds. No wheezing, rhonchi or rales.  Skin:    General: Skin is warm and dry.  Neurological:     Mental Status: She is alert.  Psychiatric:        Speech: Speech normal.        Behavior: Behavior normal.        Thought Content: Thought content normal.

## 2024-08-14 ENCOUNTER — Ambulatory Visit: Payer: Self-pay | Admitting: Family

## 2024-09-07 ENCOUNTER — Encounter: Payer: Self-pay | Admitting: Family

## 2024-09-07 ENCOUNTER — Ambulatory Visit: Payer: PPO | Admitting: *Deleted

## 2024-09-07 ENCOUNTER — Ambulatory Visit (INDEPENDENT_AMBULATORY_CARE_PROVIDER_SITE_OTHER): Admitting: Family

## 2024-09-07 ENCOUNTER — Other Ambulatory Visit: Payer: Self-pay | Admitting: Family

## 2024-09-07 ENCOUNTER — Telehealth: Payer: Self-pay | Admitting: *Deleted

## 2024-09-07 VITALS — BP 130/70 | HR 76 | Temp 98.6°F | Ht 62.5 in | Wt 153.8 lb

## 2024-09-07 VITALS — Ht 62.25 in | Wt 150.0 lb

## 2024-09-07 DIAGNOSIS — L0291 Cutaneous abscess, unspecified: Secondary | ICD-10-CM | POA: Diagnosis not present

## 2024-09-07 DIAGNOSIS — E785 Hyperlipidemia, unspecified: Secondary | ICD-10-CM

## 2024-09-07 DIAGNOSIS — Z0000 Encounter for general adult medical examination without abnormal findings: Secondary | ICD-10-CM

## 2024-09-07 MED ORDER — MUPIROCIN 2 % EX OINT: 1.0000 | TOPICAL_OINTMENT | Freq: Two times a day (BID) | CUTANEOUS | 2 refills | Status: AC

## 2024-09-07 NOTE — Patient Instructions (Addendum)
  Start bactroban   Warm compresses; let me know if doesn't completely resolve as I would strongly recommend oral antibiotic.   Let me know of any concerns as happy to re evaluate area any time.

## 2024-09-07 NOTE — Assessment & Plan Note (Addendum)
 She declines po abx after I &D. We discussed that I do not think this is unreasonable as long as no concerns for worsening infection and abscesss heals well. Provided bactroban prn. Counseled on periodic use of Dial soap under her arms to lower risk of recurrence.

## 2024-09-07 NOTE — Telephone Encounter (Signed)
 Pt is aware she had an appt today in office

## 2024-09-07 NOTE — Telephone Encounter (Signed)
 Noted

## 2024-09-07 NOTE — Progress Notes (Signed)
 Subjective:   Heather Rodgers is a 78 y.o. who presents for a Medicare Wellness preventive visit.  As a reminder, Annual Wellness Visits don't include a physical exam, and some assessments may be limited, especially if this visit is performed virtually. We may recommend an in-person follow-up visit with your provider if needed.  Visit Complete: Virtual I connected with  [name] on 09/07/24 by a audio enabled telemedicine application and verified that I am speaking with the correct person using two identifiers.  Patient Location: Home  Provider Location: Home Office  I discussed the limitations of evaluation and management by telemedicine. The patient expressed understanding and agreed to proceed.  Vital Signs: Because this visit was a virtual/telehealth visit, some criteria may be missing or patient reported. Any vitals not documented were not able to be obtained and vitals that have been documented are patient reported.  VideoDeclined- This patient declined Librarian, academic. Therefore the visit was completed with audio only.  Persons Participating in Visit: Patient.  AWV Questionnaire: No: Patient Medicare AWV questionnaire was not completed prior to this visit.  Cardiac Risk Factors include: advanced age (>63men, >40 women);hypertension;dyslipidemia     Objective:    Today's Vitals   09/07/24 0811 09/07/24 0814  Weight: 150 lb (68 kg)   Height: 5' 2.25 (1.581 m)   PainSc:  2    Body mass index is 27.22 kg/m.     09/07/2024    8:32 AM 10/08/2023    8:38 AM 09/02/2023   10:38 AM 08/28/2022    3:17 PM 08/14/2021    9:20 AM 06/13/2020    1:44 PM 08/06/2017    8:15 AM  Advanced Directives  Does Patient Have a Medical Advance Directive? No No Yes No No Yes Yes   Type of Surveyor, minerals;Living will   Healthcare Power of [employer] Power of Attorney  Does patient want to make  changes to medical advance directive?      No - Patient declined No - Patient declined   Copy of Healthcare Power of Attorney in Chart?   No - copy requested   No - copy requested No - copy requested   Would patient like information on creating a medical advance directive? No - Patient declined No - Patient declined  No - Patient declined No - Patient declined       Data saved with a previous flowsheet row definition    Current Medications (verified) Outpatient Encounter Medications as of 09/07/2024  Medication Sig   Cholecalciferol  125 MCG (5000 UT) TABS Take 1 tablet by mouth daily.   cyanocobalamin  (VITAMIN B12) 500 MCG tablet Take 500 mcg by mouth daily. (Patient taking differently: Take 5,000 mcg by mouth daily.)   Glucosamine-MSM-Hyaluronic Acd (JOINT HEALTH PO) Take 1 tablet by mouth.   hydrochlorothiazide  (HYDRODIURIL ) 25 MG tablet TAKE 1 TABLET BY MOUTH DAILY   OVER THE COUNTER MEDICATION daily.   pravastatin  (PRAVACHOL ) 20 MG tablet TAKE 1 TABLET BY MOUTH DAILY   pravastatin  (PRAVACHOL ) 40 MG tablet TAKE 1 TABLET BY MOUTH DAILY   Probiotic Product (PROBIOTIC DAILY PO) Take by mouth daily.   telmisartan  (MICARDIS ) 80 MG tablet TAKE 1 TABLET BY MOUTH DAILY   nicotine  (NICODERM CQ  - DOSED IN MG/24 HOURS) 14 mg/24hr patch Place 1 patch (14 mg total) onto the skin daily. (Patient not taking: Reported on 09/07/2024)   No facility-administered encounter medications on file as of  09/07/2024.    Allergies (verified) Penicillins   History: Past Medical History:  Diagnosis Date   Alcohol abuse 2017   rehab 11/2015   Anemia    Arthritis    Colitis 2017   c.difficile   Hypertension    Kidney stones    Past Surgical History:  Procedure Laterality Date   ESOPHAGOGASTRODUODENOSCOPY (EGD) WITH PROPOFOL  N/A 10/08/2023   Procedure: ESOPHAGOGASTRODUODENOSCOPY (EGD) WITH PROPOFOL ;  Surgeon: Therisa Bi, MD;  Location: [facility] ENDOSCOPY;  Service: Gastroenterology;  Laterality: N/A;   NO  PAST SURGERIES     Family History  Problem Relation Age of Onset   Heart disease Mother    Hypertension Mother    Arthritis Father    Heart disease Father    Hypertension Father    Kidney disease Neg Hx    Social History   Socioeconomic History   Marital status: Widowed    Spouse name: Not on file   Number of children: Not on file   Years of education: Not on file   Highest education level: Associate degree: occupational, Scientist, product/process development, or vocational program  Occupational History   Not on file  Tobacco Use   Smoking status: Some Days    Current packs/day: 0.00    Average packs/day: 0.5 packs/day for 47.0 years (23.5 ttl pk-yrs)    Types: Cigarettes    Start date: 09/19/1995    Last attempt to quit: 12/01/2015    Years since quitting: 8.7   Smokeless tobacco: Never   Tobacco comments:    A pack last 4-5 days  Vaping Use   Vaping status: Never Used  Substance and Sexual Activity   Alcohol use: No    Alcohol/week: 0.0 standard drinks of alcohol    Comment: sober since late December 2016   Drug use: No   Sexual activity: Never  Other Topics Concern   Not on file  Social History Narrative   Widower   Employed on golf course, 3 days week   Some College   Caffeine- 1 cup of coffee daily and no soda or tea   3 children   Social Drivers of Corporate investment banker Strain: Low Risk  (09/07/2024)   Overall Financial Resource Strain (CARDIA)    Difficulty of Paying Living Expenses: Not hard at all  Food Insecurity: No Food Insecurity (09/07/2024)   Hunger Vital Sign    Worried About Running Out of Food in the Last Year: Never true    Ran Out of Food in the Last Year: Never true  Transportation Needs: No Transportation Needs (09/07/2024)   PRAPARE - Administrator, Civil Service (Medical): No    Lack of Transportation (Non-Medical): No  Physical Activity: Insufficiently Active (09/07/2024)   Exercise Vital Sign    Days of Exercise per Week: 3 days     Minutes of Exercise per Session: 30 min  Stress: No Stress Concern Present (09/07/2024)   [employer] of Occupational Health - Occupational Stress Questionnaire    Feeling of Stress: Not at all  Social Connections: Socially Isolated (09/07/2024)   Social Connection and Isolation Panel    Frequency of Communication with Friends and Family: More than three times a week    Frequency of Social Gatherings with Friends and Family: Three times a week    Attends Religious Services: Never    Active Member of Clubs or Organizations: No    Attends Banker Meetings: Never    Marital Status: Widowed  Tobacco Counseling Ready to quit: No Counseling given: Not Answered Tobacco comments: A pack last 4-5 days    Clinical Intake:  Pre-visit preparation completed: Yes  Pain : 0-10 Pain Score: 2  Pain Location: Axilla Pain Orientation: Left Pain Descriptors / Indicators: Dull Pain Onset: In the past 7 days Pain Frequency: Intermittent     BMI - recorded: 27.22 Nutritional Status: BMI of 19-24  Normal Nutritional Risks: None Diabetes: No  Lab Results  Component Value Date   HGBA1C 6.2 08/03/2024   HGBA1C 6.4 12/23/2023   HGBA1C 5.6 06/18/2023     How often do you need to have someone help you when you read instructions, pamphlets, or other written materials from your doctor or pharmacy?: 1 - Never  Interpreter Needed?: No  Information entered by :: R. Vaishnav Demartin LPN   Activities of Daily Living     09/07/2024    8:20 AM  In your present state of health, do you have any difficulty performing the following activities:  Hearing? 0  Vision? 0  Difficulty concentrating or making decisions? 0  Walking or climbing stairs? 0  Dressing or bathing? 0  Doing errands, shopping? 0  Preparing Food and eating ? N  Using the Toilet? N  In the past six months, have you accidently leaked urine? N  Do you have problems with loss of bowel control? N  Managing your  Medications? N  Managing your Finances? N  Housekeeping or managing your Housekeeping? N    Patient Care Team: Dineen Rollene MATSU, FNP as PCP - General (Family Medicine) Therisa Bi, MD as Consulting Physician (Gastroenterology) Tobie Franky SQUIBB, DPM as Consulting Physician (Podiatry)  I have updated your Care Teams any recent Medical Services you may have received from other providers in the past year.     Assessment:   This is a routine wellness examination for Lener.  Hearing/Vision screen Hearing Screening - Comments:: No issues Vision Screening - Comments:: glasses   Goals Addressed             This Visit's Progress    Patient Stated       Wants to continue to work       Depression Screen     09/07/2024    8:28 AM 08/03/2024    8:36 AM 12/23/2023    8:10 AM 09/02/2023   10:33 AM 06/18/2023    8:04 AM 02/13/2023    2:51 PM 12/12/2022    8:16 AM  PHQ 2/9 Scores  PHQ - 2 Score 0 0 0 0 0 0 0  PHQ- 9 Score 0 0  0   0    Fall Risk     09/07/2024    8:21 AM 08/03/2024    8:36 AM 12/23/2023    8:10 AM 09/02/2023   10:29 AM 06/18/2023    8:04 AM  Fall Risk   Falls in the past year? 0 0 0 0 0  Number falls in past yr: 0 0 0 0 0  Injury with Fall? 0 0 0 0 0  Risk for fall due to : No Fall Risks No Fall Risks No Fall Risks No Fall Risks No Fall Risks  Follow up Falls evaluation completed;Falls prevention discussed Falls evaluation completed;Education provided Falls evaluation completed Falls prevention discussed;Falls evaluation completed Falls evaluation completed    MEDICARE RISK AT HOME:  Medicare Risk at Home Any stairs in or around the home?: No If so, are there any without handrails?: No Home free  of loose throw rugs in walkways, pet beds, electrical cords, etc?: Yes Adequate lighting in your home to reduce risk of falls?: Yes Life alert?: No Use of a cane, walker or w/c?: No Grab bars in the bathroom?: Yes Shower chair or bench in shower?: No Elevated  toilet seat or a handicapped toilet?: No  TIMED UP AND GO:  Was the test performed?  No  Cognitive Function: 6CIT completed    08/06/2017    8:33 AM  MMSE - Mini Mental State Exam  Orientation to time 5   Orientation to Place 5   Registration 3   Attention/ Calculation 5   Recall 3   Language- name 2 objects 2   Language- repeat 1  Language- follow 3 step command 3   Language- read & follow direction 1   Write a sentence 1   Copy design 1   Total score 30      Data saved with a previous flowsheet row definition        09/07/2024    8:34 AM 09/02/2023   10:38 AM 08/28/2022    3:19 PM 08/14/2021    9:22 AM 06/13/2020    1:46 PM  6CIT Screen  What Year? 0 points 0 points 0 points 0 points 0 points  What month? 0 points 0 points 0 points 0 points 0 points  What time? 0 points 0 points 0 points 0 points 0 points  Count back from 20 0 points 0 points 0 points 0 points   Months in reverse 0 points 0 points 0 points 0 points   Repeat phrase 0 points 0 points 0 points    Total Score 0 points 0 points 0 points      Immunizations Immunization History  Administered Date(s) Administered   Fluad Quad(high Dose 65+) 08/22/2020   INFLUENZA, HIGH DOSE SEASONAL PF 08/22/2016, 08/13/2017   Influenza-Unspecified 09/21/2015   Pneumococcal Conjugate-13 01/02/2017   Pneumococcal Polysaccharide-23 04/22/2013   Pneumococcal-Unspecified 04/05/2014   Td 05/10/2014    Screening Tests Health Maintenance  Topic Date Due   Zoster Vaccines- Shingrix (1 of 2) Never done   Lung Cancer Screening  01/29/2018   Mammogram  05/23/2023   DTaP/Tdap/Td (2 - Tdap) 05/10/2024   Medicare Annual Wellness (AWV)  09/01/2024   Influenza Vaccine  02/23/2025 (Originally 06/26/2024)   Pneumococcal Vaccine: 50+ Years  Completed   DEXA SCAN  Completed   Hepatitis C Screening  Completed   Meningococcal B Vaccine  Aged Out   Colonoscopy  Discontinued   COVID-19 Vaccine  Discontinued    Health Maintenance  Items Addressed: Patient declines vaccines. Patient is aware that she has an order for a mammogram and will call and schedule. Patient will discuss lung cancer screening with her PCP today at her visit.  Additional Screening:  Vision Screening: Recommended annual ophthalmology exams for early detection of glaucoma and other disorders of the eye. Is the patient up to date with their annual eye exam?  Yes  Who is the provider or what is the name of the office in which the patient attends annual eye exams?  Dr. Laurice  Dental Screening: Recommended annual dental exams for proper oral hygiene  Community Resource Referral / Chronic Care Management: CRR required this visit?  No   CCM required this visit?  No   Plan:    I have personally reviewed and noted the following in the patient's chart:   Medical and social history Use of alcohol, tobacco or  illicit drugs  Current medications and supplements including opioid prescriptions. Patient is not currently taking opioid prescriptions. Functional ability and status Nutritional status Physical activity Advanced directives List of other physicians Hospitalizations, surgeries, and ER visits in previous 12 months Vitals Screenings to include cognitive, depression, and falls Referrals and appointments  In addition, I have reviewed and discussed with patient certain preventive protocols, quality metrics, and best practice recommendations. A written personalized care plan for preventive services as well as general preventive health recommendations were provided to patient.   Angeline Fredericks, LPN   [id number]   After Visit Summary: (MyChart) Due to this being a telephonic visit, the after visit summary with patients personalized plan was offered to patient via MyChart   Notes: Nothing significant to report at this time.  Phone note sent o PCP

## 2024-09-07 NOTE — Patient Instructions (Signed)
 Heather Rodgers,  Thank you for taking the time for your Medicare Wellness Visit. I appreciate your continued commitment to your health goals. Please review the care plan we discussed, and feel free to reach out if I can assist you further.  Medicare recommends these wellness visits once per year to help you and your care team stay ahead of potential health issues. These visits are designed to focus on prevention, allowing your provider to concentrate on managing your acute and chronic conditions during your regular appointments.  Please note that Annual Wellness Visits do not include a physical exam. Some assessments may be limited, especially if the visit was conducted virtually. If needed, we may recommend a separate in-person follow-up with your provider.  Ongoing Care Seeing your primary care provider every 3 to 6 months helps us  monitor your health and provide consistent, personalized care.  Remember to call and get your mammogram scheduled.  Referrals If a referral was made during today's visit and you haven't received any updates within two weeks, please contact the referred provider directly to check on the status.  Recommended Screenings:  Health Maintenance  Topic Date Due   Zoster (Shingles) Vaccine (1 of 2) Never done   Screening for Lung Cancer  01/29/2018   Breast Cancer Screening  05/23/2023   DTaP/Tdap/Td vaccine (2 - Tdap) 05/10/2024   Flu Shot  02/23/2025*   Medicare Annual Wellness Visit  09/07/2025   Pneumococcal Vaccine for age over 27  Completed   DEXA scan (bone density measurement)  Completed   Hepatitis C Screening  Completed   Meningitis B Vaccine  Aged Out   Colon Cancer Screening  Discontinued   COVID-19 Vaccine  Discontinued  *Topic was postponed. The date shown is not the original due date.       09/07/2024    8:32 AM  Advanced Directives  Does Patient Have a Medical Advance Directive? No  Would patient like information on creating a medical advance  directive? No - Patient declined   Advance Care Planning is important because it: Ensures you receive medical care that aligns with your values, goals, and preferences. Provides guidance to your family and loved ones, reducing the emotional burden of decision-making during critical moments.  Vision: Annual vision screenings are recommended for early detection of glaucoma, cataracts, and diabetic retinopathy. These exams can also reveal signs of chronic conditions such as diabetes and high blood pressure.  Dental: Annual dental screenings help detect early signs of oral cancer, gum disease, and other conditions linked to overall health, including heart disease and diabetes.  Please see the attached documents for additional preventive care recommendations.

## 2024-09-07 NOTE — Telephone Encounter (Signed)
 Performed AWV Patient stated that she has another cyst that has come up under her arm. Patient stated that it is now a little painful. Patient stated that she usually goes to a walk in clinic for this. Patient  is scheduled to see you today at 12:00. Patient's care gap shows that she is overdue a lung cancer screening which she said that she probably has gotten calls about it but does not usually answer her phone if she does not recognize the telephone number.  Patient stated that a pack of cigarettes now will last her 4-5 days. Patient stated that she will discuss the lung cancer screening with you today at her visit.

## 2024-09-07 NOTE — Progress Notes (Signed)
 Assessment & Plan:  Abscess Assessment & Plan: She declines po abx after I &D. We discussed that I do not think this is unreasonable as long as no concerns for worsening infection and abscesss heals well. Provided bactroban prn. Counseled on periodic use of Dial soap under her arms to lower risk of recurrence.   Orders: -     Mupirocin; Apply 1 Application topically 2 (two) times daily.  Dispense: 22 g; Refill: 2 -     WOUND CULTURE     Return precautions given.   Risks, benefits, and alternatives of the medications and treatment plan prescribed today were discussed, and patient expressed understanding.   Education regarding symptom management and diagnosis given to patient on AVS either electronically or printed.  No follow-ups on file.  [name], FNP  Subjective:    Patient ID: Heather Rodgers, female    DOB: 1946/04/02, 78 y.o.   MRN: [id number]  CC: Heather Rodgers is a 78 y.o. female who presents today for an acute visit.    HPI: Complains left axillary knot, 2 weeks She gets these frequently and from time to time , it will drain Mammogram is not UTD.   She hasn't tried any medication for this.  Denies fever, chills.    Allergies: Penicillins Current Outpatient Medications on File Prior to Visit  Medication Sig Dispense Refill   Cholecalciferol  125 MCG (5000 UT) TABS Take 1 tablet by mouth daily.     cyanocobalamin  (VITAMIN B12) 500 MCG tablet Take 500 mcg by mouth daily. (Patient taking differently: Take 5,000 mcg by mouth daily.)     Glucosamine-MSM-Hyaluronic Acd (JOINT HEALTH PO) Take 1 tablet by mouth.     hydrochlorothiazide  (HYDRODIURIL ) 25 MG tablet TAKE 1 TABLET BY MOUTH DAILY 90 tablet 3   nicotine  (NICODERM CQ  - DOSED IN MG/24 HOURS) 14 mg/24hr patch Place 1 patch (14 mg total) onto the skin daily. 28 patch 1   OVER THE COUNTER MEDICATION daily.     pravastatin  (PRAVACHOL ) 20 MG tablet TAKE 1 TABLET BY MOUTH DAILY 90 tablet 3    pravastatin  (PRAVACHOL ) 40 MG tablet TAKE 1 TABLET BY MOUTH DAILY 90 tablet 1   Probiotic Product (PROBIOTIC DAILY PO) Take by mouth daily.     telmisartan  (MICARDIS ) 80 MG tablet TAKE 1 TABLET BY MOUTH DAILY 90 tablet 3   No current facility-administered medications on file prior to visit.    Review of Systems  Constitutional:  Negative for chills and fever.  Respiratory:  Negative for cough.   Cardiovascular:  Negative for chest pain and palpitations.  Gastrointestinal:  Negative for nausea and vomiting.  Skin:  Positive for wound (left axillary).      Objective:    BP 130/70   Pulse 76   Temp 98.6 F (37 C) (Oral)   Ht 5' 2.5 (1.588 m)   Wt 153 lb 12.8 oz (69.8 kg)   SpO2 98%   BMI 27.68 kg/m   BP Readings from Last 3 Encounters:  09/07/24 130/70  08/03/24 122/70  12/23/23 122/80   Wt Readings from Last 3 Encounters:  09/07/24 153 lb 12.8 oz (69.8 kg)  09/07/24 150 lb (68 kg)  08/03/24 152 lb 2 oz (69 kg)    Physical Exam Vitals reviewed.  Constitutional:      Appearance: She is well-developed.  Eyes:     Conjunctiva/sclera: Conjunctivae normal.  Cardiovascular:     Rate and Rhythm: Normal rate and regular rhythm.  Pulses: Normal pulses.     Heart sounds: Normal heart sounds.  Pulmonary:     Effort: Pulmonary effort is normal.     Breath sounds: Normal breath sounds. No wheezing, rhonchi or rales.  Skin:    General: Skin is warm and dry.  Neurological:     Mental Status: She is alert.  Psychiatric:        Speech: Speech normal.        Behavior: Behavior normal.        Thought Content: Thought content normal.     Patient does not have previous history of MRSA.  Patient does not have diabetes.   The patient gave informed consent for the procedure. Patient and I discussed risks, benefits, and alternatives to I & D.  Including risk of infection from laceration, localized pain, and bleeding. We discussed the option for oral antibiotics. Patient  verbalized understanding to conversation and all questions were answered. We jointly decided to proceed with procedure.  On exam, there is a 3 cm indurated abscess.  It is tender to palpation.  There is erythema with no discharge.  The patient gave informed consent for the procedure. The area was prepped with antiseptic solution. The area was anesthetized using lidocaine . The patient tolerated the anesthetic well.    #11 blade was used to incise the abscess.  Purulent bloody drainage was noted.  Bleeding from the wound was controlled by applying direct pressure with gauze.    A bandage was placed.  Wound culture was obtained.  The patient tolerated the procedure well.  Instructions were given to the patient.   Complications: None

## 2024-09-10 ENCOUNTER — Ambulatory Visit: Payer: Self-pay | Admitting: Family

## 2024-09-10 NOTE — Progress Notes (Signed)
 Call pt Wound culture without organism  If sxs persist, advise re evaluation and re collection of wound culture

## 2024-09-11 LAB — WOUND CULTURE
MICRO NUMBER:: 17090994
RESULT:: NO GROWTH
SPECIMEN QUALITY:: ADEQUATE

## 2024-11-23 ENCOUNTER — Other Ambulatory Visit: Payer: Self-pay | Admitting: Family

## 2024-11-23 DIAGNOSIS — I159 Secondary hypertension, unspecified: Secondary | ICD-10-CM

## 2024-12-17 ENCOUNTER — Other Ambulatory Visit: Payer: Self-pay | Admitting: Family

## 2025-09-08 ENCOUNTER — Ambulatory Visit
